# Patient Record
Sex: Female | Born: 1940 | Race: White | Hispanic: No | Marital: Married | Smoking: Never smoker
Health system: Southern US, Community
[De-identification: ages and names within clinical notes are randomized; demographics above are authoritative.]

## PROBLEM LIST (undated history)

## (undated) DIAGNOSIS — K635 Polyp of colon: Secondary | ICD-10-CM

## (undated) DIAGNOSIS — E0590 Thyrotoxicosis, unspecified without thyrotoxic crisis or storm: Secondary | ICD-10-CM

## (undated) HISTORY — DX: Thyrotoxicosis, unspecified without thyrotoxic crisis or storm: E05.90

## (undated) HISTORY — PX: APPENDECTOMY: SHX54

## (undated) HISTORY — DX: Polyp of colon: K63.5

## (undated) HISTORY — PX: TONSILECTOMY, ADENOIDECTOMY, BILATERAL MYRINGOTOMY AND TUBES: SHX2538

---

## 2012-02-29 ENCOUNTER — Other Ambulatory Visit: Payer: Self-pay | Admitting: Unknown Physician Specialty

## 2012-02-29 LAB — APTT: Activated PTT: 27.4 secs (ref 23.6–35.9)

## 2012-03-02 ENCOUNTER — Ambulatory Visit: Payer: Self-pay | Admitting: Unknown Physician Specialty

## 2012-03-02 LAB — PLATELET COUNT: Platelet: 212 10*3/uL (ref 150–440)

## 2012-06-21 ENCOUNTER — Ambulatory Visit: Payer: Self-pay | Admitting: Unknown Physician Specialty

## 2013-02-14 ENCOUNTER — Ambulatory Visit: Payer: Self-pay | Admitting: Unknown Physician Specialty

## 2013-11-20 DIAGNOSIS — L988 Other specified disorders of the skin and subcutaneous tissue: Secondary | ICD-10-CM | POA: Insufficient documentation

## 2013-11-20 DIAGNOSIS — L908 Other atrophic disorders of skin: Secondary | ICD-10-CM

## 2014-02-15 ENCOUNTER — Ambulatory Visit: Payer: Self-pay | Admitting: Unknown Physician Specialty

## 2014-08-02 DIAGNOSIS — E039 Hypothyroidism, unspecified: Secondary | ICD-10-CM | POA: Insufficient documentation

## 2014-11-06 DIAGNOSIS — Z719 Counseling, unspecified: Secondary | ICD-10-CM | POA: Insufficient documentation

## 2014-11-30 ENCOUNTER — Ambulatory Visit: Payer: Self-pay | Admitting: Gastroenterology

## 2014-11-30 DIAGNOSIS — D126 Benign neoplasm of colon, unspecified: Secondary | ICD-10-CM | POA: Insufficient documentation

## 2015-02-04 LAB — SURGICAL PATHOLOGY

## 2015-02-18 ENCOUNTER — Other Ambulatory Visit: Payer: Self-pay | Admitting: Unknown Physician Specialty

## 2015-02-18 DIAGNOSIS — E041 Nontoxic single thyroid nodule: Secondary | ICD-10-CM

## 2015-02-20 ENCOUNTER — Ambulatory Visit
Admission: RE | Admit: 2015-02-20 | Discharge: 2015-02-20 | Disposition: A | Discharge status: Home / Self Care | Payer: PPO | Source: Ambulatory Visit | Attending: Unknown Physician Specialty | Admitting: Unknown Physician Specialty

## 2015-02-20 DIAGNOSIS — E041 Nontoxic single thyroid nodule: Secondary | ICD-10-CM | POA: Diagnosis not present

## 2015-08-08 ENCOUNTER — Other Ambulatory Visit: Payer: Self-pay | Admitting: Unknown Physician Specialty

## 2015-08-08 DIAGNOSIS — E041 Nontoxic single thyroid nodule: Secondary | ICD-10-CM

## 2015-08-13 DIAGNOSIS — K862 Cyst of pancreas: Secondary | ICD-10-CM | POA: Insufficient documentation

## 2015-10-31 DIAGNOSIS — Z803 Family history of malignant neoplasm of breast: Secondary | ICD-10-CM | POA: Diagnosis not present

## 2015-10-31 DIAGNOSIS — Z1231 Encounter for screening mammogram for malignant neoplasm of breast: Secondary | ICD-10-CM | POA: Diagnosis not present

## 2015-12-25 DIAGNOSIS — E039 Hypothyroidism, unspecified: Secondary | ICD-10-CM | POA: Diagnosis not present

## 2015-12-25 DIAGNOSIS — E538 Deficiency of other specified B group vitamins: Secondary | ICD-10-CM | POA: Diagnosis not present

## 2015-12-25 DIAGNOSIS — M8580 Other specified disorders of bone density and structure, unspecified site: Secondary | ICD-10-CM | POA: Diagnosis not present

## 2015-12-25 DIAGNOSIS — Z0000 Encounter for general adult medical examination without abnormal findings: Secondary | ICD-10-CM | POA: Diagnosis not present

## 2016-01-02 ENCOUNTER — Other Ambulatory Visit: Payer: Self-pay | Admitting: Family Medicine

## 2016-01-02 DIAGNOSIS — M8580 Other specified disorders of bone density and structure, unspecified site: Secondary | ICD-10-CM

## 2016-01-20 ENCOUNTER — Ambulatory Visit
Admission: RE | Admit: 2016-01-20 | Discharge: 2016-01-20 | Disposition: A | Discharge status: Home / Self Care | Payer: PPO | Source: Ambulatory Visit | Attending: Family Medicine | Admitting: Family Medicine

## 2016-01-20 ENCOUNTER — Encounter: Payer: Self-pay | Admitting: Emergency Medicine

## 2016-01-20 ENCOUNTER — Emergency Department: Payer: PPO

## 2016-01-20 ENCOUNTER — Emergency Department
Admission: EM | Admit: 2016-01-20 | Discharge: 2016-01-20 | Disposition: A | Discharge status: Home / Self Care | Payer: PPO | Attending: Emergency Medicine | Admitting: Emergency Medicine

## 2016-01-20 DIAGNOSIS — M25512 Pain in left shoulder: Secondary | ICD-10-CM | POA: Diagnosis not present

## 2016-01-20 DIAGNOSIS — S29019A Strain of muscle and tendon of unspecified wall of thorax, initial encounter: Secondary | ICD-10-CM

## 2016-01-20 DIAGNOSIS — S32010A Wedge compression fracture of first lumbar vertebra, initial encounter for closed fracture: Secondary | ICD-10-CM

## 2016-01-20 DIAGNOSIS — S161XXA Strain of muscle, fascia and tendon at neck level, initial encounter: Secondary | ICD-10-CM

## 2016-01-20 DIAGNOSIS — Y929 Unspecified place or not applicable: Secondary | ICD-10-CM | POA: Diagnosis not present

## 2016-01-20 DIAGNOSIS — Y939 Activity, unspecified: Secondary | ICD-10-CM | POA: Insufficient documentation

## 2016-01-20 DIAGNOSIS — Y999 Unspecified external cause status: Secondary | ICD-10-CM | POA: Insufficient documentation

## 2016-01-20 DIAGNOSIS — S29012A Strain of muscle and tendon of back wall of thorax, initial encounter: Secondary | ICD-10-CM | POA: Diagnosis not present

## 2016-01-20 DIAGNOSIS — S46812A Strain of other muscles, fascia and tendons at shoulder and upper arm level, left arm, initial encounter: Secondary | ICD-10-CM | POA: Diagnosis not present

## 2016-01-20 DIAGNOSIS — M546 Pain in thoracic spine: Secondary | ICD-10-CM | POA: Diagnosis not present

## 2016-01-20 DIAGNOSIS — M8589 Other specified disorders of bone density and structure, multiple sites: Secondary | ICD-10-CM | POA: Diagnosis not present

## 2016-01-20 DIAGNOSIS — S29011A Strain of muscle and tendon of front wall of thorax, initial encounter: Secondary | ICD-10-CM | POA: Insufficient documentation

## 2016-01-20 DIAGNOSIS — M542 Cervicalgia: Secondary | ICD-10-CM | POA: Diagnosis not present

## 2016-01-20 DIAGNOSIS — S46912A Strain of unspecified muscle, fascia and tendon at shoulder and upper arm level, left arm, initial encounter: Secondary | ICD-10-CM

## 2016-01-20 DIAGNOSIS — S4992XA Unspecified injury of left shoulder and upper arm, initial encounter: Secondary | ICD-10-CM | POA: Diagnosis not present

## 2016-01-20 DIAGNOSIS — K769 Liver disease, unspecified: Secondary | ICD-10-CM

## 2016-01-20 DIAGNOSIS — S199XXA Unspecified injury of neck, initial encounter: Secondary | ICD-10-CM | POA: Diagnosis not present

## 2016-01-20 DIAGNOSIS — S32018A Other fracture of first lumbar vertebra, initial encounter for closed fracture: Secondary | ICD-10-CM | POA: Diagnosis not present

## 2016-01-20 DIAGNOSIS — Z13820 Encounter for screening for osteoporosis: Secondary | ICD-10-CM | POA: Insufficient documentation

## 2016-01-20 DIAGNOSIS — M8580 Other specified disorders of bone density and structure, unspecified site: Secondary | ICD-10-CM

## 2016-01-20 DIAGNOSIS — S3992XA Unspecified injury of lower back, initial encounter: Secondary | ICD-10-CM | POA: Diagnosis not present

## 2016-01-20 DIAGNOSIS — M545 Low back pain: Secondary | ICD-10-CM | POA: Diagnosis not present

## 2016-01-20 MED ORDER — ACETAMINOPHEN 500 MG PO TABS
1000.0000 mg | ORAL_TABLET | Freq: Once | ORAL | Status: AC
  Administered 2016-01-20: 1000 mg via ORAL

## 2016-01-20 MED ORDER — ACETAMINOPHEN 500 MG PO TABS
ORAL_TABLET | ORAL | Status: AC
  Filled 2016-01-20: qty 2

## 2016-01-20 MED ORDER — TRAMADOL HCL 50 MG PO TABS: 50.0000 mg | ORAL_TABLET | Freq: Four times a day (QID) | ORAL | Status: DC | PRN
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
## 2016-01-20 NOTE — Discharge Instructions (Signed)
Tramadol as needed for pain. Be aware that this medication could cause drowsiness which may increase your risk for falling. Call your primary care doctor and make an appointment for further evaluation of the cyst on your liver as we discussed today.

## 2016-01-21 DIAGNOSIS — S32000A Wedge compression fracture of unspecified lumbar vertebra, initial encounter for closed fracture: Secondary | ICD-10-CM | POA: Diagnosis not present

## 2016-01-21 DIAGNOSIS — K769 Liver disease, unspecified: Secondary | ICD-10-CM | POA: Diagnosis not present

## 2016-01-21 DIAGNOSIS — M47816 Spondylosis without myelopathy or radiculopathy, lumbar region: Secondary | ICD-10-CM | POA: Diagnosis not present

## 2016-01-29 DIAGNOSIS — S32010A Wedge compression fracture of first lumbar vertebra, initial encounter for closed fracture: Secondary | ICD-10-CM | POA: Diagnosis not present

## 2016-01-30 ENCOUNTER — Ambulatory Visit
Admission: RE | Admit: 2016-01-30 | Discharge: 2016-01-30 | Disposition: A | Discharge status: Home / Self Care | Payer: PPO | Source: Ambulatory Visit | Attending: Unknown Physician Specialty | Admitting: Unknown Physician Specialty

## 2016-01-30 ENCOUNTER — Other Ambulatory Visit: Payer: Self-pay | Admitting: Unknown Physician Specialty

## 2016-01-30 DIAGNOSIS — S335XXA Sprain of ligaments of lumbar spine, initial encounter: Secondary | ICD-10-CM | POA: Insufficient documentation

## 2016-01-30 DIAGNOSIS — S32010A Wedge compression fracture of first lumbar vertebra, initial encounter for closed fracture: Secondary | ICD-10-CM

## 2016-01-30 DIAGNOSIS — M545 Low back pain: Secondary | ICD-10-CM | POA: Diagnosis not present

## 2016-01-30 DIAGNOSIS — M5126 Other intervertebral disc displacement, lumbar region: Secondary | ICD-10-CM | POA: Diagnosis not present

## 2016-01-30 DIAGNOSIS — S3992XA Unspecified injury of lower back, initial encounter: Secondary | ICD-10-CM | POA: Diagnosis not present

## 2016-02-07 DIAGNOSIS — K8689 Other specified diseases of pancreas: Secondary | ICD-10-CM | POA: Diagnosis not present

## 2016-02-07 DIAGNOSIS — K769 Liver disease, unspecified: Secondary | ICD-10-CM | POA: Diagnosis not present

## 2016-02-07 DIAGNOSIS — K7689 Other specified diseases of liver: Secondary | ICD-10-CM | POA: Diagnosis not present

## 2016-02-11 ENCOUNTER — Ambulatory Visit
Admission: RE | Admit: 2016-02-11 | Discharge: 2016-02-11 | Disposition: A | Discharge status: Home / Self Care | Payer: PPO | Source: Ambulatory Visit | Attending: Family Medicine | Admitting: Family Medicine

## 2016-02-11 ENCOUNTER — Other Ambulatory Visit: Payer: Self-pay | Admitting: Family Medicine

## 2016-02-11 DIAGNOSIS — M25511 Pain in right shoulder: Secondary | ICD-10-CM

## 2016-02-11 DIAGNOSIS — K862 Cyst of pancreas: Secondary | ICD-10-CM | POA: Diagnosis not present

## 2016-02-11 DIAGNOSIS — M8580 Other specified disorders of bone density and structure, unspecified site: Secondary | ICD-10-CM | POA: Insufficient documentation

## 2016-02-11 DIAGNOSIS — R202 Paresthesia of skin: Secondary | ICD-10-CM | POA: Diagnosis not present

## 2016-02-11 DIAGNOSIS — S4991XA Unspecified injury of right shoulder and upper arm, initial encounter: Secondary | ICD-10-CM | POA: Diagnosis not present

## 2016-02-21 ENCOUNTER — Ambulatory Visit
Admission: RE | Admit: 2016-02-21 | Discharge: 2016-02-21 | Disposition: A | Discharge status: Home / Self Care | Payer: PPO | Source: Ambulatory Visit | Attending: Family Medicine | Admitting: Family Medicine

## 2016-02-21 ENCOUNTER — Other Ambulatory Visit: Payer: Self-pay | Admitting: Family Medicine

## 2016-02-21 DIAGNOSIS — R911 Solitary pulmonary nodule: Secondary | ICD-10-CM

## 2016-03-02 ENCOUNTER — Ambulatory Visit: Payer: PPO

## 2016-03-02 DIAGNOSIS — E041 Nontoxic single thyroid nodule: Secondary | ICD-10-CM | POA: Diagnosis not present

## 2016-03-02 DIAGNOSIS — L818 Other specified disorders of pigmentation: Secondary | ICD-10-CM | POA: Diagnosis not present

## 2016-03-03 DIAGNOSIS — M5030 Other cervical disc degeneration, unspecified cervical region: Secondary | ICD-10-CM | POA: Diagnosis not present

## 2016-03-03 DIAGNOSIS — M546 Pain in thoracic spine: Secondary | ICD-10-CM | POA: Diagnosis not present

## 2016-03-03 DIAGNOSIS — M4722 Other spondylosis with radiculopathy, cervical region: Secondary | ICD-10-CM | POA: Diagnosis not present

## 2016-03-03 DIAGNOSIS — M549 Dorsalgia, unspecified: Secondary | ICD-10-CM | POA: Diagnosis not present

## 2016-03-03 DIAGNOSIS — M5412 Radiculopathy, cervical region: Secondary | ICD-10-CM | POA: Diagnosis not present

## 2016-03-03 DIAGNOSIS — M4155 Other secondary scoliosis, thoracolumbar region: Secondary | ICD-10-CM | POA: Diagnosis not present

## 2016-03-04 DIAGNOSIS — Z13220 Encounter for screening for lipoid disorders: Secondary | ICD-10-CM | POA: Diagnosis not present

## 2016-03-04 DIAGNOSIS — Z1321 Encounter for screening for nutritional disorder: Secondary | ICD-10-CM | POA: Diagnosis not present

## 2016-03-04 DIAGNOSIS — M8580 Other specified disorders of bone density and structure, unspecified site: Secondary | ICD-10-CM | POA: Diagnosis not present

## 2016-03-04 DIAGNOSIS — D126 Benign neoplasm of colon, unspecified: Secondary | ICD-10-CM | POA: Diagnosis not present

## 2016-03-04 DIAGNOSIS — L309 Dermatitis, unspecified: Secondary | ICD-10-CM | POA: Diagnosis not present

## 2016-03-04 DIAGNOSIS — Z131 Encounter for screening for diabetes mellitus: Secondary | ICD-10-CM | POA: Diagnosis not present

## 2016-03-04 DIAGNOSIS — Z780 Asymptomatic menopausal state: Secondary | ICD-10-CM | POA: Diagnosis not present

## 2016-03-04 DIAGNOSIS — I517 Cardiomegaly: Secondary | ICD-10-CM | POA: Diagnosis not present

## 2016-03-04 DIAGNOSIS — E039 Hypothyroidism, unspecified: Secondary | ICD-10-CM | POA: Diagnosis not present

## 2016-03-04 DIAGNOSIS — E041 Nontoxic single thyroid nodule: Secondary | ICD-10-CM | POA: Diagnosis not present

## 2016-03-04 DIAGNOSIS — E559 Vitamin D deficiency, unspecified: Secondary | ICD-10-CM | POA: Diagnosis not present

## 2016-03-04 DIAGNOSIS — K869 Disease of pancreas, unspecified: Secondary | ICD-10-CM | POA: Diagnosis not present

## 2016-03-04 DIAGNOSIS — Z136 Encounter for screening for cardiovascular disorders: Secondary | ICD-10-CM | POA: Diagnosis not present

## 2016-03-04 DIAGNOSIS — R918 Other nonspecific abnormal finding of lung field: Secondary | ICD-10-CM | POA: Diagnosis not present

## 2016-03-04 DIAGNOSIS — N6019 Diffuse cystic mastopathy of unspecified breast: Secondary | ICD-10-CM | POA: Diagnosis not present

## 2016-03-12 DIAGNOSIS — S52501A Unspecified fracture of the lower end of right radius, initial encounter for closed fracture: Secondary | ICD-10-CM | POA: Diagnosis not present

## 2016-03-12 DIAGNOSIS — W010XXA Fall on same level from slipping, tripping and stumbling without subsequent striking against object, initial encounter: Secondary | ICD-10-CM | POA: Diagnosis not present

## 2016-03-12 DIAGNOSIS — Y92009 Unspecified place in unspecified non-institutional (private) residence as the place of occurrence of the external cause: Secondary | ICD-10-CM | POA: Diagnosis not present

## 2016-03-12 DIAGNOSIS — S52521A Torus fracture of lower end of right radius, initial encounter for closed fracture: Secondary | ICD-10-CM | POA: Diagnosis not present

## 2016-03-12 DIAGNOSIS — S6991XA Unspecified injury of right wrist, hand and finger(s), initial encounter: Secondary | ICD-10-CM | POA: Diagnosis not present

## 2016-03-18 ENCOUNTER — Ambulatory Visit: Payer: PPO

## 2016-03-18 DIAGNOSIS — S52591A Other fractures of lower end of right radius, initial encounter for closed fracture: Secondary | ICD-10-CM | POA: Diagnosis not present

## 2016-03-20 DIAGNOSIS — K862 Cyst of pancreas: Secondary | ICD-10-CM | POA: Diagnosis not present

## 2016-03-25 ENCOUNTER — Ambulatory Visit
Admission: RE | Admit: 2016-03-25 | Discharge: 2016-03-25 | Disposition: A | Discharge status: Home / Self Care | Payer: PPO | Source: Ambulatory Visit | Attending: Unknown Physician Specialty | Admitting: Unknown Physician Specialty

## 2016-03-25 DIAGNOSIS — E041 Nontoxic single thyroid nodule: Secondary | ICD-10-CM | POA: Diagnosis not present

## 2016-03-31 DIAGNOSIS — L814 Other melanin hyperpigmentation: Secondary | ICD-10-CM | POA: Diagnosis not present

## 2016-03-31 DIAGNOSIS — X32XXXA Exposure to sunlight, initial encounter: Secondary | ICD-10-CM | POA: Diagnosis not present

## 2016-03-31 DIAGNOSIS — L821 Other seborrheic keratosis: Secondary | ICD-10-CM | POA: Diagnosis not present

## 2016-04-07 DIAGNOSIS — M5030 Other cervical disc degeneration, unspecified cervical region: Secondary | ICD-10-CM | POA: Diagnosis not present

## 2016-04-07 DIAGNOSIS — M4722 Other spondylosis with radiculopathy, cervical region: Secondary | ICD-10-CM | POA: Diagnosis not present

## 2016-04-07 DIAGNOSIS — S52591D Other fractures of lower end of right radius, subsequent encounter for closed fracture with routine healing: Secondary | ICD-10-CM | POA: Diagnosis not present

## 2016-04-07 DIAGNOSIS — M5412 Radiculopathy, cervical region: Secondary | ICD-10-CM | POA: Diagnosis not present

## 2016-04-24 DIAGNOSIS — S52591D Other fractures of lower end of right radius, subsequent encounter for closed fracture with routine healing: Secondary | ICD-10-CM | POA: Diagnosis not present

## 2016-05-05 ENCOUNTER — Ambulatory Visit (INDEPENDENT_AMBULATORY_CARE_PROVIDER_SITE_OTHER): Payer: PPO | Admitting: Sports Medicine

## 2016-05-05 ENCOUNTER — Encounter: Payer: Self-pay | Admitting: Sports Medicine

## 2016-05-05 DIAGNOSIS — S90821A Blister (nonthermal), right foot, initial encounter: Secondary | ICD-10-CM | POA: Diagnosis not present

## 2016-05-05 DIAGNOSIS — L601 Onycholysis: Secondary | ICD-10-CM | POA: Diagnosis not present

## 2016-05-05 DIAGNOSIS — N6019 Diffuse cystic mastopathy of unspecified breast: Secondary | ICD-10-CM | POA: Insufficient documentation

## 2016-05-05 DIAGNOSIS — M79674 Pain in right toe(s): Secondary | ICD-10-CM

## 2016-05-05 DIAGNOSIS — M810 Age-related osteoporosis without current pathological fracture: Secondary | ICD-10-CM | POA: Insufficient documentation

## 2016-05-05 DIAGNOSIS — L089 Local infection of the skin and subcutaneous tissue, unspecified: Secondary | ICD-10-CM

## 2016-05-05 DIAGNOSIS — M8580 Other specified disorders of bone density and structure, unspecified site: Secondary | ICD-10-CM | POA: Insufficient documentation

## 2016-05-05 DIAGNOSIS — L600 Ingrowing nail: Secondary | ICD-10-CM | POA: Diagnosis not present

## 2016-05-05 MED ORDER — AMOXICILLIN-POT CLAVULANATE 875-125 MG PO TABS: 1.0000 | ORAL_TABLET | Freq: Two times a day (BID) | ORAL | Status: DC

## 2016-05-05 NOTE — Patient Instructions (Signed)

## 2016-05-05 NOTE — Progress Notes (Signed)
Subjective: Sherry Bernard is a 75 y.o. female patient presents to office today complaining of a painful incurvated, red, hot, swollen right 2nd toenail; not sure how it started thinks tight shoes or socks may have caused it. This has been present for >1 week. Patient has treated this by neosporin. Patient denies fever/chills/nausea/vomitting/any other related constitutional symptoms at this time.  Patient Active Problem List   Diagnosis Date Noted  . Fibrocystic breast disease 05/05/2016  . Osteopenia 05/05/2016  . Cyst of pancreas 08/13/2015  . 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 Encounter for consultation 11/06/2014  . Acquired hypothyroidism 08/02/2014  . Rhytides 11/20/2013  . Postmenopausal 10/13/1991    Current Outpatient Prescriptions on File Prior to Visit  Medication Sig Dispense Refill  . levothyroxine (SYNTHROID, LEVOTHROID) 100 MCG tablet Take 100 mcg by mouth daily before breakfast.    . traMADol (ULTRAM) 50 MG tablet Take 1 tablet (50 mg total) by mouth every 6 (six) hours as needed. 20 tablet 0   No current facility-administered medications on file prior to visit.     No Known Allergies  Objective:  There were no vitals filed for this visit.  General: Well developed, nourished, in no acute distress, alert and oriented x3   Dermatology: Skin is warm, dry and supple bilateral. Right 2nd toenail with lysis and redness with blistered skin. (+) Erythema. (+) Edema. (+) serosanguous  drainage present. The remaining nails appear unremarkable at this time. There are no open sores, lesions or other signs of infection present.  Vascular: Dorsalis Pedis artery and Posterior Tibial artery pedal pulses are 1/4 bilateral with immedate capillary fill time. Scant Pedal hair growth present. No lower extremity edema.   Neruologic: Grossly intact via light touch bilateral.  Musculoskeletal: Tenderness to palpation of the Right 2nd toe. Muscular strength within normal limits in  all groups bilateral. 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  Assesement and Plan: Problem List Items Addressed This Visit    None    Visit Diagnoses    Blister of foot with infection, right, initial encounter    -  Primary   Relevant Medications   amoxicillin-clavulanate (AUGMENTIN) 875-125 MG per tablet 1 tablet (Start on 05/05/2016 10:15 AM)   Ingrown nail       Toe pain, right       Onycholysis of toenail          -Discussed treatment alternatives and plan of care; Explained permanent/temporary nail avulsion and post procedure course to patient. - After a verbal consent, injected 3 ml of a 50:50 mixture of 2% plain  lidocaine and 0.5% plain marcaine in a normal right 2nd toe block fashion. Next, a  betadine prep was performed. Anesthesia was tested and found to be appropriate.  The right 2nd toenail was then incised from the hyponychium to the epinychium and was removed and cleared from the field and the area was then flushed with alcohol and dressed with antibiotic cream and a dry sterile dressing. -Patient was instructed to leave the dressing intact for today and begin soaking  in a weak solution of betadine and water tomorrow. Patient was instructed to  soak for 15 minutes each day and apply neosporin and a gauze or bandaid dressing each day. -Patient was instructed to monitor the toe for signs of infection and return to office if toe becomes red, hot or swollen. -Rx Augmentin to take as instructed -Advised ice, elevation, and tylenol or motrin if needed for pain.  -  Recommend good supportive shoes that do not irritate toe.  -Patient is to return in 1 week for follow up care or sooner if problems arise.  Landis Martins, DPM

## 2016-05-14 ENCOUNTER — Encounter: Payer: Self-pay | Admitting: Podiatry

## 2016-05-14 ENCOUNTER — Ambulatory Visit (INDEPENDENT_AMBULATORY_CARE_PROVIDER_SITE_OTHER): Payer: PPO | Admitting: Podiatry

## 2016-05-14 ENCOUNTER — Telehealth: Payer: Self-pay | Admitting: Sports Medicine

## 2016-05-14 DIAGNOSIS — L539 Erythematous condition, unspecified: Secondary | ICD-10-CM

## 2016-05-14 DIAGNOSIS — L600 Ingrowing nail: Secondary | ICD-10-CM | POA: Diagnosis not present

## 2016-05-14 NOTE — Telephone Encounter (Signed)
Pt called in about toenail. It is still red from last Tuesday wants to know what she needs to do

## 2016-05-18 NOTE — Progress Notes (Signed)
Subjective: Sherry Bernard is a 75 y.o.  female returns to office today for follow up evaluation after having right 2nd digit toenail avulsion performed last week with Dr. Cannon Kettle. She presents today as a walk-in patient. She states there is a small amount of redness on the nail bed and she would have this area checked. She denies any drainage or pus. No pain. She is continued on antibiotics. Patient has been soaking using epsom salts and applying topical antibiotic covered with bandaid daily. Patient denies fevers, chills, nausea, vomiting. Denies any calf pain, chest pain, SOB.   Objective:  Vitals: Reviewed  General: Well developed, nourished, in no acute distress, alert and oriented x3   Dermatology: Skin is warm, dry and supple bilateral. Right 2nd nail bed appears to be clean, dry, with mild granular tissue and surrounding scab. There is faint erythema on the proximal nail fold how there is no ascending saline solution discrete localized. There is no drainage or pus expressed. No ascending synovitis. No fluctuance or crepitus. There is no malodor.  Neurovascular status: Intact. No lower extremity swelling; No pain with calf compression bilateral.  Musculoskeletal: No tenderness to palpation of the right 2nd digit nail bed. Muscular strength within normal limits bilateral.   Assesement and Plan: S/p partial nail avulsion, with faint erythema along the proximal nail fold.   -Continue soaking in epsom salts twice a day followed by antibiotic ointment and a band-aid. Can leave uncovered at night. Continue this until completely healed.  -Finish course of antibiotics  -Follow-up with Dr. Cannon Kettle as scheduled -Monitor for any signs/symptoms of infection. Call the office immediately if any occur or go directly to the emergency room. Call with any questions/concerns.  Celesta Gentile, DPM

## 2016-05-19 ENCOUNTER — Ambulatory Visit (INDEPENDENT_AMBULATORY_CARE_PROVIDER_SITE_OTHER): Payer: PPO | Admitting: Sports Medicine

## 2016-05-19 ENCOUNTER — Encounter: Payer: Self-pay | Admitting: Sports Medicine

## 2016-05-19 DIAGNOSIS — M79674 Pain in right toe(s): Secondary | ICD-10-CM

## 2016-05-19 DIAGNOSIS — Z9889 Other specified postprocedural states: Secondary | ICD-10-CM

## 2016-05-19 DIAGNOSIS — M79673 Pain in unspecified foot: Secondary | ICD-10-CM

## 2016-05-19 NOTE — Progress Notes (Signed)
Subjective: Sherry Bernard is a 75 y.o. female patient returns to office today for follow up evaluation after having Right 2nd toenail temporary nail avulsion performed on 05-05-16. Patient has been soaking using betadine and applying topical antibiotic covered with bandaid daily. Patient deniesfever/chills/nausea/vomitting/any other related constitutional symptoms at this time.  Patient Active Problem List   Diagnosis Date Noted  . Fibrocystic breast disease 05/05/2016  . Osteopenia 05/05/2016  . Cyst of pancreas 08/13/2015  . 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 Encounter for consultation 11/06/2014  . Acquired hypothyroidism 08/02/2014  . Rhytides 11/20/2013  . Postmenopausal 10/13/1991    Current Outpatient Prescriptions on File Prior to Visit  Medication Sig Dispense Refill  . baclofen (LIORESAL) 10 MG tablet     . Biotin 5 MG CAPS Take by mouth.    . calcium elemental as carbonate (BARIATRIC TUMS ULTRA) 400 MG chewable tablet Chew by mouth.    . Cholecalciferol (VITAMIN D-1000 MAX ST) 1000 units tablet Take by mouth.    . Coenzyme Q10 ([employer] COENZYME Q-10) 100 MG capsule Take by mouth.    . DHA-EPA-VITAMIN E PO Take by mouth.    . diclofenac (VOLTAREN) 75 MG EC tablet     . Diclofenac Sodium CR 100 MG 24 hr tablet Take by mouth.    . Glucosamine HCl-MSM (SM GLUCOSAMINE HCL-MSM) 750-750 MG TABS Take by mouth.    . hydrocortisone 2.5 % cream Apply topically.    Marland Kitchen levothyroxine (SYNTHROID, LEVOTHROID) 100 MCG tablet Take 100 mcg by mouth daily before breakfast.    . levothyroxine (SYNTHROID, LEVOTHROID) 100 MCG tablet Take by mouth.    . levothyroxine (SYNTHROID, LEVOTHROID) 75 MCG tablet     . levothyroxine (SYNTHROID, LEVOTHROID) 75 MCG tablet Take by mouth.    . Omega-3 Fatty Acids (OMEGA-3 FISH OIL) 1200 MG CAPS Take by mouth.    . polyethylene glycol powder (GLYCOLAX/MIRALAX) powder As directed for colonic prep.    . traMADol (ULTRAM) 50 MG tablet Take 1 tablet (50 mg total)  by mouth every 6 (six) hours as needed. 20 tablet 0  . traMADol (ULTRAM) 50 MG tablet Take by mouth.    . vitamin C (ASCORBIC ACID) 500 MG tablet Take by mouth.     Current Facility-Administered Medications on File Prior to Visit  Medication Dose Route Frequency Provider Last Rate Last Dose  . amoxicillin-clavulanate (AUGMENTIN) 875-125 MG per tablet 1 tablet  1 tablet Oral Q12H Jazzelle Zhang, DPM        No Known Allergies  Objective:  General: Well developed, nourished, in no acute distress, alert and oriented x3   Dermatology: Skin is warm, dry and supple bilateral. Right 2nd toenail bed appears to be clean, dry, with mild granular tissue and surrounding eschar/scab. (-) Erythema. (+) Mild Edema. (-) serosanguous drainage present. The remaining nails appear unremarkable at this time. There are no other lesions or other signs of infection  present.  Neurovascular status: Intact. No lower extremity swelling; No pain with calf compression bilateral.  Musculoskeletal: Decreased tenderness to palpation of the right 2nd toenail fold(s). 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 Muscular strength within normal limits bilateral.   Assesement and Plan: Problem List Items Addressed This Visit    None    Visit Diagnoses    S/P nail surgery    -  Primary   Toe pain, right          -Examined patient  -Cleansed right 2nd toenail fold and gently  scrubbed with peroxide and q-tip/curetted away eschar at site and applied antibiotic cream covered with bandaid.  -Discussed plan of care with patient. -Patient to use coban compression to right 2nd toe for swelling -Patient to now begin soaking in a weak solution of Epsom salt and warm water. Patient was instructed to soak for 15-20 minutes each day until the toe appears normal and there is no drainage, redness, tenderness, or swelling at the procedure site, and apply neosporin and a gauze or bandaid dressing each day as needed. May  leave open to air at night. -Educated patient on long term care after nail surgery. -Patient was instructed to monitor the toe for reoccurrence and signs of infection; Patient advised to return to office or go to ER if toe becomes red, hot or swollen. -Recommend stretching exercises for toes  -Recommend toe spacer for hammertoe -Patient is to return as needed or sooner if problems arise.  Landis Martins, DPM

## 2016-06-30 DIAGNOSIS — G5621 Lesion of ulnar nerve, right upper limb: Secondary | ICD-10-CM | POA: Diagnosis not present

## 2016-06-30 DIAGNOSIS — M5412 Radiculopathy, cervical region: Secondary | ICD-10-CM | POA: Diagnosis not present

## 2016-06-30 DIAGNOSIS — M5030 Other cervical disc degeneration, unspecified cervical region: Secondary | ICD-10-CM | POA: Diagnosis not present

## 2016-06-30 DIAGNOSIS — M4722 Other spondylosis with radiculopathy, cervical region: Secondary | ICD-10-CM | POA: Diagnosis not present

## 2016-07-17 DIAGNOSIS — H16223 Keratoconjunctivitis sicca, not specified as Sjogren's, bilateral: Secondary | ICD-10-CM | POA: Diagnosis not present

## 2016-07-29 DIAGNOSIS — H16223 Keratoconjunctivitis sicca, not specified as Sjogren's, bilateral: Secondary | ICD-10-CM | POA: Diagnosis not present

## 2016-08-11 DIAGNOSIS — M5030 Other cervical disc degeneration, unspecified cervical region: Secondary | ICD-10-CM | POA: Diagnosis not present

## 2016-08-11 DIAGNOSIS — G5621 Lesion of ulnar nerve, right upper limb: Secondary | ICD-10-CM | POA: Diagnosis not present

## 2016-08-11 DIAGNOSIS — M4722 Other spondylosis with radiculopathy, cervical region: Secondary | ICD-10-CM | POA: Diagnosis not present

## 2016-08-21 ENCOUNTER — Ambulatory Visit: Payer: Self-pay | Admitting: Internal Medicine

## 2016-09-09 DIAGNOSIS — H16223 Keratoconjunctivitis sicca, not specified as Sjogren's, bilateral: Secondary | ICD-10-CM | POA: Diagnosis not present

## 2016-09-14 ENCOUNTER — Ambulatory Visit: Payer: Self-pay | Admitting: Internal Medicine

## 2016-09-14 DIAGNOSIS — J060 Acute laryngopharyngitis: Secondary | ICD-10-CM | POA: Diagnosis not present

## 2016-09-30 ENCOUNTER — Encounter (INDEPENDENT_AMBULATORY_CARE_PROVIDER_SITE_OTHER): Payer: Self-pay

## 2016-09-30 ENCOUNTER — Encounter: Payer: Self-pay | Admitting: Internal Medicine

## 2016-09-30 ENCOUNTER — Ambulatory Visit (INDEPENDENT_AMBULATORY_CARE_PROVIDER_SITE_OTHER): Payer: PPO | Admitting: Internal Medicine

## 2016-09-30 DIAGNOSIS — K862 Cyst of pancreas: Secondary | ICD-10-CM

## 2016-09-30 DIAGNOSIS — D126 Benign neoplasm of colon, unspecified: Secondary | ICD-10-CM | POA: Diagnosis not present

## 2016-09-30 DIAGNOSIS — R05 Cough: Secondary | ICD-10-CM

## 2016-09-30 DIAGNOSIS — N6019 Diffuse cystic mastopathy of unspecified breast: Secondary | ICD-10-CM

## 2016-09-30 DIAGNOSIS — E039 Hypothyroidism, unspecified: Secondary | ICD-10-CM

## 2016-09-30 DIAGNOSIS — R059 Cough, unspecified: Secondary | ICD-10-CM

## 2016-09-30 DIAGNOSIS — R197 Diarrhea, unspecified: Secondary | ICD-10-CM

## 2016-09-30 NOTE — Progress Notes (Signed)
Pre visit review using our clinic review tool, if applicable. No additional management support is needed unless otherwise documented below in the visit note. 
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
## 2016-09-30 NOTE — Patient Instructions (Signed)
Saline nasal spray - flush nose at least 2-3x/day  nasacort nasal spray - 2 sprays each nostril one time per day.  Do this in the evening  Robitussin DM - twice a day as needed.     Continue your probiotic.    Can add benefiber.  May help to bulk up the stool.

## 2016-10-06 ENCOUNTER — Telehealth: Payer: Self-pay | Admitting: Internal Medicine

## 2016-10-06 DIAGNOSIS — R197 Diarrhea, unspecified: Secondary | ICD-10-CM | POA: Insufficient documentation

## 2016-10-06 DIAGNOSIS — R059 Cough, unspecified: Secondary | ICD-10-CM | POA: Insufficient documentation

## 2016-10-06 DIAGNOSIS — R05 Cough: Secondary | ICD-10-CM | POA: Insufficient documentation

## 2016-10-06 NOTE — Assessment & Plan Note (Signed)
With drainage and cough as outlined.  Lungs are clear.  Saline nasal spray and nasacort nasal spray as directed.  Robitussin DM bid prn.  Hold on abx.  Hold on xray.  Will try above.  No acid reflux.  Follow.  Will be reevaluated if symptoms persist.

## 2016-10-06 NOTE — Assessment & Plan Note (Signed)
Incidental cysts found on pancreas.  Evaluated.  Felt to have no worrisome features.  Too small for adequate cytology or FNA.  Recommended f/u MRI/MRCP in 12 months.

## 2016-10-06 NOTE — Telephone Encounter (Signed)
Patient advised of below and verbalized understanding.  She will go to urgent care to be evaluated.

## 2016-10-06 NOTE — Assessment & Plan Note (Signed)
Per note, had mammogram 2017.  Need report.

## 2016-10-06 NOTE — Telephone Encounter (Signed)
Pt called stating that her cough hasn't got any better and would like to see if something could be called in.Sherry Bernard

## 2016-10-06 NOTE — Assessment & Plan Note (Signed)
Intermittent.  Take probiotic daily.  Follow.  F/u if persistent.

## 2016-10-06 NOTE — Assessment & Plan Note (Signed)
Saw Dr Tami Ribas.  Thyroid nodules right side - stable by ultrasound 2013-2017.  On levothyroxine.  Follow tsh.

## 2016-10-06 NOTE — Telephone Encounter (Signed)
If persistent cough and no better with the nasal sprays and robitussin DM, etc, then she needs to be reevaluated.  May need xray.  Let her know that I am out of the office.

## 2016-10-06 NOTE — Telephone Encounter (Signed)
Patient calls stating she is just having a constant cough with mucus/phlegm no better.  She has tried OTC Robitussin DM twice a directed.  No fever please advise.

## 2016-10-06 NOTE — Assessment & Plan Note (Signed)
Per note, colonoscopy 2017 - as outlined.  Recommended f/u colonoscopy in 3 years.

## 2016-10-20 DIAGNOSIS — H16223 Keratoconjunctivitis sicca, not specified as Sjogren's, bilateral: Secondary | ICD-10-CM | POA: Diagnosis not present

## 2016-11-04 DIAGNOSIS — Z1231 Encounter for screening mammogram for malignant neoplasm of breast: Secondary | ICD-10-CM | POA: Diagnosis not present

## 2016-11-04 DIAGNOSIS — Z803 Family history of malignant neoplasm of breast: Secondary | ICD-10-CM | POA: Diagnosis not present

## 2016-11-04 LAB — HM MAMMOGRAPHY

## 2016-11-05 ENCOUNTER — Encounter: Payer: Self-pay | Admitting: Internal Medicine

## 2016-11-06 ENCOUNTER — Encounter: Payer: Self-pay | Admitting: Internal Medicine

## 2016-11-06 DIAGNOSIS — Z719 Counseling, unspecified: Secondary | ICD-10-CM | POA: Insufficient documentation

## 2017-01-06 ENCOUNTER — Ambulatory Visit (INDEPENDENT_AMBULATORY_CARE_PROVIDER_SITE_OTHER): Payer: PPO | Admitting: Internal Medicine

## 2017-01-06 ENCOUNTER — Encounter: Payer: Self-pay | Admitting: Internal Medicine

## 2017-01-06 VITALS — BP 118/76 | HR 70 | Temp 97.1°F | Resp 16 | Ht 63.0 in | Wt 122.8 lb

## 2017-01-06 DIAGNOSIS — R202 Paresthesia of skin: Secondary | ICD-10-CM | POA: Diagnosis not present

## 2017-01-06 DIAGNOSIS — M545 Low back pain, unspecified: Secondary | ICD-10-CM

## 2017-01-06 DIAGNOSIS — R197 Diarrhea, unspecified: Secondary | ICD-10-CM | POA: Diagnosis not present

## 2017-01-06 DIAGNOSIS — E039 Hypothyroidism, unspecified: Secondary | ICD-10-CM

## 2017-01-06 DIAGNOSIS — K862 Cyst of pancreas: Secondary | ICD-10-CM | POA: Diagnosis not present

## 2017-01-06 DIAGNOSIS — D126 Benign neoplasm of colon, unspecified: Secondary | ICD-10-CM

## 2017-01-06 DIAGNOSIS — R200 Anesthesia of skin: Secondary | ICD-10-CM

## 2017-01-06 MED ORDER — AZITHROMYCIN 250 MG PO TABS: ORAL_TABLET | ORAL | 0 refills | Status: DC

## 2017-01-06 MED ORDER — LEVOTHYROXINE SODIUM 75 MCG PO TABS: 75.0000 ug | ORAL_TABLET | Freq: Every day | ORAL | 12 refills | Status: DC

## 2017-01-06 NOTE — Progress Notes (Signed)
Patient ID: Sherry Bernard, female   DOB: 05-04-1941, 76 y.o.   MRN: [id number]   Subjective:    Patient ID: Sherry Bernard, female    DOB: 21-Jan-1941, 76 y.o.   MRN: [id number]  HPI  Patient here for a scheduled follow up.  She reports she has noticed some numbness and tingling in her right arm/elbow.  No neck pain.  Working on her posture.  Has taken diclofenac.  No weakness.  No headache.  Taking probiotic bid.  Diarrhea is better.  Stools more formed.  No abdominal pain.  No chest pain.  No sob.  Some low back discomfort.  Has been using ice and heat.  Seeing a trainer.  Is walking daily.  Walking on the inside of her foot.  Feels this is aggravating her legs/back.  Feels needs to work more on her posture and her gait.     Past Medical History:  Diagnosis Date  . Colon polyp   . Hyperthyroidism    Past Surgical History:  Procedure Laterality Date  . APPENDECTOMY     child  . TONSILECTOMY, ADENOIDECTOMY, BILATERAL MYRINGOTOMY AND TUBES     child   Family History  Problem Relation Age of Onset  . Breast cancer Mother    Social History   Social History  . Marital status: Married    Spouse name: N/A  . Number of children: 3  . Years of education: N/A   Occupational History  . retired    Social History Main Topics  . Smoking status: Never Smoker  . Smokeless tobacco: Never Used  . Alcohol use Yes     Comment: socially  . Drug use: No  . Sexual activity: Yes   Other Topics Concern  . None   Social History Narrative  . None    Outpatient Encounter Prescriptions as of 01/06/2017  Medication Sig  . Biotin 5 MG CAPS Take by mouth.  . Calcium Carb-Cholecalciferol (CALCIUM 1000 + D PO) Take by mouth.  . Calcium Carbonate-Simethicone 1000-60 MG CHEW Chew by mouth.  . Glucosamine HCl-MSM (SM GLUCOSAMINE HCL-MSM) 750-750 MG TABS Take by mouth.  . levothyroxine (SYNTHROID, LEVOTHROID) 75 MCG tablet Take 1 tablet (75 mcg total) by mouth daily before breakfast.  . Multiple  Vitamins-Minerals (CENTRUM SILVER 50+WOMEN) TABS Take 1 tablet by mouth daily.  . Omega-3 Fatty Acids (OMEGA-3 FISH OIL) 1200 MG CAPS Take by mouth.  . Probiotic Product (PROBIOTIC-10) CAPS Take by mouth.  Marland Kitchen XIIDRA 5 % SOLN Place 1 drop into both eyes as needed.  . [DISCONTINUED] levothyroxine (SYNTHROID, LEVOTHROID) 75 MCG tablet Take by mouth.  Marland Kitchen azithromycin (ZITHROMAX) 250 MG tablet Take 2 tablets x 1 day and then 1 tablet per day for four more days.   No facility-administered encounter medications on file as of 01/06/2017.     Review of Systems  Constitutional: Negative for appetite change and unexpected weight change.  HENT: Negative for congestion and sinus pressure.   Respiratory: Negative for cough, chest tightness and shortness of breath.   Cardiovascular: Negative for chest pain, palpitations and leg swelling.  Gastrointestinal: Positive for diarrhea. Negative for abdominal pain, nausea and vomiting.  Genitourinary: Negative for difficulty urinating and dysuria.  Musculoskeletal:       Low back pain as outlined.  Numbness/tingling - right arm and elbow.    Skin: Negative for color change and rash.  Neurological: Negative for dizziness, light-headedness and headaches.  Psychiatric/Behavioral: Negative for agitation and dysphoric mood.  Objective:    Physical Exam  Constitutional: She appears well-developed and well-nourished. No distress.  HENT:  Nose: Nose normal.  Mouth/Throat: Oropharynx is clear and moist.  Neck: Neck supple. No thyromegaly present.  Cardiovascular: Normal rate and regular rhythm.   Pulmonary/Chest: Breath sounds normal. No respiratory distress. She has no wheezes.  Abdominal: Soft. Bowel sounds are normal. There is no tenderness.  Musculoskeletal: She exhibits no edema or tenderness.  Lymphadenopathy:    She has no cervical adenopathy.  Skin: No rash noted. No erythema.  Psychiatric: She has a normal mood and affect. Her behavior is normal.      BP 118/76 (BP Location: Left Arm, Patient Position: Sitting, Cuff Size: Normal)   Pulse 70   Temp 97.1 F (36.2 C) (Oral)   Resp 16   Ht 5\' 3"  (1.6 m)   Wt 122 lb 12.8 oz (55.7 kg)   SpO2 98%   BMI 21.75 kg/m  Wt Readings from Last 3 Encounters:  01/06/17 122 lb 12.8 oz (55.7 kg)  09/30/16 120 lb 2 oz (54.5 kg)  01/20/16 117 lb (53.1 kg)     Lab Results  Component Value Date   PLT 212 03/02/2012   INR 0.8 02/29/2012    US Thyroid  Result Date: 03/25/2016 CLINICAL DATA:  76 year old female with a history of thyroid nodule EXAM: THYROID ULTRASOUND TECHNIQUE: Ultrasound examination of the thyroid gland and adjacent soft tissues was performed. COMPARISON:  Prior thyroid ultrasound 02/20/2015 FINDINGS: Right thyroid lobe Measurements: 3.3 x 1.1 x 1.1 cm (previously 3.6 x 0.7 x 0.9 cm). Diffusely heterogeneous thyroid gland. Solitary solid isoechoic nodule in the lower gland measures 0.9 x 0.6 x 0.8 cm which is slightly smaller than 1.1 x 0.7 x 1.0 cm previously. Left thyroid lobe Measurements: 2.7 x 1.5 x 1.1 cm (previously 3.3 x 0.7 x 0.7 cm). No nodules visualized. Isthmus Thickness: 0.3 cm.  No nodules visualized. Lymphadenopathy None visualized. IMPRESSION: 1. Involuting right lower lobe thyroid nodule. Decrease in size over time is consistent with benignity. 2. Otherwise, unchanged small heterogeneous thyroid gland. Electronically Signed   By: Jacqulynn Cadet M.D.   On: 03/25/2016 15:07       Assessment & Plan:   Problem List Items Addressed This Visit    Acquired hypothyroidism    On thyroid replacement.  Follow tsh.  Previous biopsy with normal path.  Ultrasound as outlined 03/25/16.  Follow thyroid function tests.        Relevant Medications   levothyroxine (SYNTHROID, LEVOTHROID) 75 MCG tablet   Other Relevant Orders   TSH   Lipid panel   Cyst of pancreas    Cyst found on pancreas (incidentally).  Evaluated.  Felt to have no worrisome features.  Too small for  adequate cytology or FNA.  Recommended f/u MRI/MRCP in 12 months.  Is scheduled for 02/07/16.        Diarrhea    On probiotic.  Diarrhea is better.  More formed stool.  Has had colonoscopy as outlined.  Discussed further w/up/evaluation.  She declines.  Wants to monitor.  Follow.        Relevant Orders   CBC with Differential/Platelet   Comprehensive metabolic panel   Low back pain - Primary    Discomfort as outlined.  Walks on the side of her foot. Feels needs to work on her posture.  Discussed further w/up and evaluation.  Refer to PT for gait training and core strengthening.  Relevant Orders   Ambulatory referral to Physical Therapy   Serrated adenoma of colon    Colonoscopy 2017.  Recommended f/u in 3 years.         Other Visit Diagnoses    Right arm numbness       Numbness and tingling as outlined.  discussed further w/up.  declines.  splint.  follow.    Relevant Orders   Vitamin B12       Einar Pheasant, MD

## 2017-01-06 NOTE — Progress Notes (Signed)
Pre-visit discussion using our clinic review tool. No additional management support is needed unless otherwise documented below in the visit note.  

## 2017-01-10 ENCOUNTER — Encounter: Payer: Self-pay | Admitting: Internal Medicine

## 2017-01-10 DIAGNOSIS — M545 Low back pain, unspecified: Secondary | ICD-10-CM | POA: Insufficient documentation

## 2017-01-10 NOTE — Assessment & Plan Note (Signed)
Discomfort as outlined.  Walks on the side of her foot. Feels needs to work on her posture.  Discussed further w/up and evaluation.  Refer to PT for gait training and core strengthening.

## 2017-01-10 NOTE — Assessment & Plan Note (Signed)
Cyst found on pancreas (incidentally).  Evaluated.  Felt to have no worrisome features.  Too small for adequate cytology or FNA.  Recommended f/u MRI/MRCP in 12 months.  Is scheduled for 02/07/16.

## 2017-01-10 NOTE — Assessment & Plan Note (Signed)
Colonoscopy 2017.  Recommended f/u in 3 years.   

## 2017-01-10 NOTE — Assessment & Plan Note (Signed)
On thyroid replacement.  Follow tsh.  Previous biopsy with normal path.  Ultrasound as outlined 03/25/16.  Follow thyroid function tests.

## 2017-01-10 NOTE — Assessment & Plan Note (Signed)
On probiotic.  Diarrhea is better.  More formed stool.  Has had colonoscopy as outlined.  Discussed further w/up/evaluation.  She declines.  Wants to monitor.  Follow.

## 2017-01-12 ENCOUNTER — Other Ambulatory Visit (INDEPENDENT_AMBULATORY_CARE_PROVIDER_SITE_OTHER): Payer: PPO

## 2017-01-12 DIAGNOSIS — R202 Paresthesia of skin: Secondary | ICD-10-CM | POA: Diagnosis not present

## 2017-01-12 DIAGNOSIS — E039 Hypothyroidism, unspecified: Secondary | ICD-10-CM

## 2017-01-12 DIAGNOSIS — R197 Diarrhea, unspecified: Secondary | ICD-10-CM | POA: Diagnosis not present

## 2017-01-12 DIAGNOSIS — R200 Anesthesia of skin: Secondary | ICD-10-CM

## 2017-01-12 LAB — COMPREHENSIVE METABOLIC PANEL
ALBUMIN: 4.1 g/dL (ref 3.5–5.2)
ALK PHOS: 57 U/L (ref 39–117)
ALT: 16 U/L (ref 0–35)
AST: 22 U/L (ref 0–37)
BUN: 20 mg/dL (ref 6–23)
CHLORIDE: 105 meq/L (ref 96–112)
CO2: 31 mEq/L (ref 19–32)
Calcium: 9.1 mg/dL (ref 8.4–10.5)
Creatinine, Ser: 0.83 mg/dL (ref 0.40–1.20)
GFR: 71.19 mL/min (ref >60.00)
GLUCOSE: 94 mg/dL (ref 70–99)
POTASSIUM: 4 meq/L (ref 3.5–5.1)
SODIUM: 142 meq/L (ref 135–145)
Total Bilirubin: 0.5 mg/dL (ref 0.2–1.2)
Total Protein: 6.5 g/dL (ref 6.0–8.3)

## 2017-01-12 LAB — CBC WITH DIFFERENTIAL/PLATELET
BASOS PCT: 0.8 % (ref 0.0–3.0)
Basophils Absolute: 0 10*3/uL (ref 0.0–0.1)
EOS PCT: 1.4 % (ref 0.0–5.0)
Eosinophils Absolute: 0.1 10*3/uL (ref 0.0–0.7)
HCT: 41.8 % (ref 36.0–46.0)
Hemoglobin: 14.3 g/dL (ref 12.0–15.0)
LYMPHS ABS: 1.6 10*3/uL (ref 0.7–4.0)
Lymphocytes Relative: 36.9 % (ref 12.0–46.0)
MCHC: 34.2 g/dL (ref 30.0–36.0)
MCV: 92.1 fl (ref 78.0–100.0)
MONO ABS: 0.4 10*3/uL (ref 0.1–1.0)
MONOS PCT: 8.9 % (ref 3.0–12.0)
NEUTROS PCT: 52 % (ref 43.0–77.0)
Neutro Abs: 2.3 10*3/uL (ref 1.4–7.7)
Platelets: 234 10*3/uL (ref 150.0–400.0)
RBC: 4.54 Mil/uL (ref 3.87–5.11)
RDW: 13.3 % (ref 11.5–15.5)
WBC: 4.4 10*3/uL (ref 4.0–10.5)

## 2017-01-12 LAB — LIPID PANEL
CHOL/HDL RATIO: 3
Cholesterol: 218 mg/dL — ABNORMAL HIGH (ref 0–200)
HDL: 86.2 mg/dL (ref >39.00)
LDL Cholesterol: 118 mg/dL — ABNORMAL HIGH (ref 0–99)
NONHDL: 131.6
Triglycerides: 67 mg/dL (ref 0.0–149.0)
VLDL: 13.4 mg/dL (ref 0.0–40.0)

## 2017-01-12 LAB — VITAMIN B12: Vitamin B-12: 1072 pg/mL — ABNORMAL HIGH (ref 211–911)

## 2017-01-12 LAB — TSH: TSH: 4.09 u[IU]/mL (ref 0.35–4.50)

## 2017-01-13 ENCOUNTER — Telehealth: Payer: Self-pay

## 2017-01-13 NOTE — Telephone Encounter (Signed)
Pt called back returning your call. Thank you!  Call pt @ [phone]

## 2017-01-13 NOTE — Telephone Encounter (Signed)
Left message to return call to our office.  

## 2017-01-13 NOTE — Telephone Encounter (Signed)
Called patient gave results diet information mailed

## 2017-01-13 NOTE — Telephone Encounter (Signed)
-----   Message from Einar Pheasant, MD sent at 01/13/2017  4:54 AM EDT ----- Notify pt that her cholesterol is slightly increased, but appears overall stable when compared to previous outside checks.  Low cholesterol diet and exercise.  We will follow.  Is not B12 deficient.  Thyroid test, hemoglobin, kidney function tests and liver function tests wnl.

## 2017-01-27 ENCOUNTER — Ambulatory Visit: Payer: PPO

## 2017-01-28 DIAGNOSIS — K862 Cyst of pancreas: Secondary | ICD-10-CM | POA: Diagnosis not present

## 2017-02-02 ENCOUNTER — Ambulatory Visit: Payer: PPO

## 2017-02-04 ENCOUNTER — Ambulatory Visit: Payer: PPO

## 2017-02-08 ENCOUNTER — Ambulatory Visit: Payer: PPO

## 2017-02-08 ENCOUNTER — Telehealth: Payer: Self-pay | Admitting: Internal Medicine

## 2017-02-08 NOTE — Telephone Encounter (Signed)
Called patient to verify she wants a z pack to take incase she gets sick. She also wanted me to check and see how many times a day she should be taking the probiotic.

## 2017-02-08 NOTE — Telephone Encounter (Signed)
Pt came into office requesting to have a prescription or  something to take in case she gets sick  [place]. She states that she spoke to Dr. Nicki Reaper about this before. Please advise pt, phone number is [phone].

## 2017-02-08 NOTE — Telephone Encounter (Signed)
Please advise 

## 2017-02-08 NOTE — Telephone Encounter (Signed)
If she is talking about motion sickness, I can prescribe scopolamine patches to apply behind her ear.  She would change every three days.  This helps with motion sickness, etc.  If wants, I can send in rx for this medication.

## 2017-02-09 NOTE — Telephone Encounter (Signed)
Patient will check pharmacy and let us know. Went over how to take otc meds as well she will call if any problems.

## 2017-02-09 NOTE — Telephone Encounter (Signed)
Has she checked with her pharmacy.  In reviewing the chart, it looks like this was sent in when she was here in the office on 01/06/17.  If not there, let me know.  Also, the probiotic is one time daily.

## 2017-03-05 ENCOUNTER — Telehealth: Payer: Self-pay | Admitting: *Deleted

## 2017-03-05 NOTE — Telephone Encounter (Signed)
Patient is having hair loss, and brittle nails. No other Symptoms. Patient suggested that she should have her thyroid checked or check for a vitamin deficiency. Please advise  Pt contact [phone]

## 2017-03-09 NOTE — Telephone Encounter (Signed)
Please let her know that her B12 was not low and her tsh was wnl in 01/2017.  If persistent problems with hair loss, I can refer her to dermatology.

## 2017-03-09 NOTE — Telephone Encounter (Signed)
Patient states started having problems about a month ago and noticed very bad while on trip. Had TSH and B12 checked in April

## 2017-03-09 NOTE — Telephone Encounter (Signed)
Please call pt at [phone]

## 2017-03-09 NOTE — Telephone Encounter (Signed)
Left message to return call to our office.  

## 2017-03-10 NOTE — Telephone Encounter (Signed)
Left message to return call to our office.  

## 2017-03-10 NOTE — Telephone Encounter (Signed)
Patient states she has app with you in July and she would just like to hold off until then to talk to you about it.

## 2017-03-17 DIAGNOSIS — H16223 Keratoconjunctivitis sicca, not specified as Sjogren's, bilateral: Secondary | ICD-10-CM | POA: Diagnosis not present

## 2017-03-25 DIAGNOSIS — L818 Other specified disorders of pigmentation: Secondary | ICD-10-CM | POA: Diagnosis not present

## 2017-03-25 DIAGNOSIS — E041 Nontoxic single thyroid nodule: Secondary | ICD-10-CM | POA: Diagnosis not present

## 2017-03-31 DIAGNOSIS — L821 Other seborrheic keratosis: Secondary | ICD-10-CM | POA: Diagnosis not present

## 2017-03-31 DIAGNOSIS — B078 Other viral warts: Secondary | ICD-10-CM | POA: Diagnosis not present

## 2017-03-31 DIAGNOSIS — L538 Other specified erythematous conditions: Secondary | ICD-10-CM | POA: Diagnosis not present

## 2017-03-31 DIAGNOSIS — D2272 Melanocytic nevi of left lower limb, including hip: Secondary | ICD-10-CM | POA: Diagnosis not present

## 2017-03-31 DIAGNOSIS — D225 Melanocytic nevi of trunk: Secondary | ICD-10-CM | POA: Diagnosis not present

## 2017-03-31 DIAGNOSIS — D2262 Melanocytic nevi of left upper limb, including shoulder: Secondary | ICD-10-CM | POA: Diagnosis not present

## 2017-04-17 ENCOUNTER — Telehealth: Payer: Self-pay | Admitting: Internal Medicine

## 2017-04-17 NOTE — Telephone Encounter (Signed)
Left pt message asking to call Allison back directly at [phone] to schedule AWV. Thanks! °

## 2017-04-30 ENCOUNTER — Encounter: Payer: Self-pay | Admitting: Internal Medicine

## 2017-04-30 DIAGNOSIS — H11823 Conjunctivochalasis, bilateral: Secondary | ICD-10-CM | POA: Diagnosis not present

## 2017-05-03 ENCOUNTER — Ambulatory Visit (INDEPENDENT_AMBULATORY_CARE_PROVIDER_SITE_OTHER): Payer: PPO

## 2017-05-03 VITALS — BP 116/72 | HR 63 | Temp 97.9°F | Resp 14 | Ht 63.0 in | Wt 123.4 lb

## 2017-05-03 DIAGNOSIS — Z23 Encounter for immunization: Secondary | ICD-10-CM

## 2017-05-03 DIAGNOSIS — Z9189 Other specified personal risk factors, not elsewhere classified: Secondary | ICD-10-CM | POA: Diagnosis not present

## 2017-05-03 DIAGNOSIS — Z0000 Encounter for general adult medical examination without abnormal findings: Secondary | ICD-10-CM

## 2017-05-03 NOTE — Progress Notes (Signed)
Subjective:   SYBRINA LANING is a 76 y.o. female who presents for an Initial Medicare Annual Wellness Visit.  Review of Systems    No ROS.  Medicare Wellness Visit. Additional risk factors are reflected in the social history.  Cardiac Risk Factors include: advanced age (>45men, >77 women)     Objective:    Today's Vitals   05/03/17 0812  BP: 116/72  Pulse: 63  Resp: 14  Temp: 97.9 F (36.6 C)  TempSrc: Oral  SpO2: 99%  Weight: 123 lb 6.4 oz (56 kg)  Height: 5\' 3"  (1.6 m)   Body mass index is 21.86 kg/m.   Current Medications (verified) Outpatient Encounter Prescriptions as of 05/03/2017  Medication Sig  . Calcium Carb-Cholecalciferol (CALCIUM 1000 + D PO) Take by mouth.  . Calcium Carbonate-Simethicone 1000-60 MG CHEW Chew by mouth.  . Glucosamine HCl-MSM (SM GLUCOSAMINE HCL-MSM) 750-750 MG TABS Take by mouth.  . levothyroxine (SYNTHROID, LEVOTHROID) 75 MCG tablet Take 1 tablet (75 mcg total) by mouth daily before breakfast. (Patient taking differently: Take 100 mcg by mouth daily before breakfast. )  . Multiple Vitamins-Minerals (CENTRUM SILVER 50+WOMEN) TABS Take 1 tablet by mouth daily.  . Omega-3 Fatty Acids (OMEGA-3 FISH OIL) 1200 MG CAPS Take by mouth.  . Probiotic Product (PROBIOTIC-10) CAPS Take by mouth.  Marland Kitchen XIIDRA 5 % SOLN Place 1 drop into both eyes as needed.  . Biotin 5 MG CAPS Take by mouth.  . [DISCONTINUED] azithromycin (ZITHROMAX) 250 MG tablet Take 2 tablets x 1 day and then 1 tablet per day for four more days.   No facility-administered encounter medications on file as of 05/03/2017.     Allergies (verified) Patient has no known allergies.   History: Past Medical History:  Diagnosis Date  . Colon polyp   . Hyperthyroidism    Past Surgical History:  Procedure Laterality Date  . APPENDECTOMY     child  . TONSILECTOMY, ADENOIDECTOMY, BILATERAL MYRINGOTOMY AND TUBES     child   Family History  Problem Relation Age of Onset  . Breast cancer  Mother    Social History   Occupational History  . retired    Social History Main Topics  . Smoking status: Never Smoker  . Smokeless tobacco: Never Used  . Alcohol use Yes     Comment: socially  . Drug use: No  . Sexual activity: Yes    Tobacco Counseling Counseling given: Not Answered   Activities of Daily Living In your present state of health, do you have any difficulty performing the following activities: 05/03/2017  Hearing? N  Vision? N  Difficulty concentrating or making decisions? Y  Walking or climbing stairs? N  Dressing or bathing? N  Doing errands, shopping? N  Preparing Food and eating ? N  Using the Toilet? N  In the past six months, have you accidently leaked urine? N  Do you have problems with loss of bowel control? N  Managing your Medications? N  Managing your Finances? N  Housekeeping or managing your Housekeeping? N  Some recent data might be hidden    Immunizations and Health Maintenance Immunization History  Administered Date(s) Administered  . Influenza-Unspecified 07/22/2015, 07/11/2016  . Pneumococcal Conjugate-13 07/22/2015  . Pneumococcal Polysaccharide-23 05/03/2017  . Tdap 08/10/2014   There are no preventive care reminders to display for this patient.  Patient Care Team: Einar Pheasant, MD as PCP - General (Internal Medicine)  Indicate any recent Medical Services you may have received from  other than [facility] providers in the past year (date may be approximate).     Assessment:   This is a routine wellness examination for [place]. The goal of the wellness visit is to assist the patient how to close the gaps in care and create a preventative care plan for the patient.   The roster of all physicians providing medical care to patient is listed in the Snapshot section of the chart.  Taking calcium VIT D as appropriate/Osteoporosis risk reviewed.    Safety issues reviewed; Smoke and carbon monoxide detectors in the home. No firearms  in the home.  Wears seatbelts when driving or riding with others. Patient does wear sunscreen or protective clothing when in direct sunlight. No violence in the home.  Patient is alert, normal appearance, oriented to person/place/and time.  Correctly identified the president of the [place], recall of 3/3 words, and performing simple calculations. Displays appropriate judgement and can read correct time from watch face.   No new identified risk were noted.  No failures at ADL's or IADL's.    BMI- discussed the importance of a healthy diet, water intake and the benefits of aerobic exercise. Educational material provided.   24 hour diet recall: Breakfast: Protein bar, 1 slice lemon poppy loaf Lunch: Tomato sandwich Dinner: Chief Strategy Officer   Daily fluid intake: 1-2 cups of caffeine, 2-3 cups of water.  Dental- every 6 months.  Sleep patterns- Sleeps 5-6 hours at night.  Wakes feeling rested.  Pneumocca administered L deltoid, tolerated well. Educational material provided.  Patient Concerns: None at this time. Follow up with PCP as needed.  Health maintenance gaps closed.  Hearing/Vision screen Hearing Screening Comments: Patient is able to hear conversational tones without difficulty.  No issues report.  Vision Screening Comments: Followed by [facility] (Dr. Jeni Salles) Wears corrective lenses when reading Last OV 04/2017 Visual acuity not assessed per patient preference since they have regular follow up with the ophthalmologist  Dietary issues and exercise activities discussed: Current Exercise Habits: Home exercise routine, Type of exercise: walking;strength training/weights (10.000 steps daily), Frequency (Times/Week): 7, Intensity: Mild  Goals    . Eat Less junk food    . Increase water intake      Depression Screen PHQ 2/9 Scores 05/03/2017 09/30/2016  PHQ - 2 Score 0 0  PHQ- 9 Score - 1    Fall Risk Fall Risk  05/03/2017 09/30/2016  Falls in the past year?  No No    Cognitive Function: MMSE - Mini Mental State Exam 05/03/2017  Orientation to time 5  Orientation to Place 5  Registration 3  Attention/ Calculation 5  Recall 3  Language- name 2 objects 2  Language- repeat 1  Language- follow 3 step command 3  Language- read & follow direction 1  Write a sentence 1  Copy design 1  Total score 30        Screening Tests Health Maintenance  Topic Date Due  . INFLUENZA VACCINE  05/12/2017  . TETANUS/TDAP  08/10/2024  . COLONOSCOPY  11/30/2024  . DEXA SCAN  Completed  . PNA vac Low Risk Adult  Completed      Plan:    End of life planning; Advance aging; Advanced directives discussed. Copy of current HCPOA/Living Will requested.    I have personally reviewed and noted the following in the patient's chart:   . Medical and social history . Use of alcohol, tobacco or illicit drugs  . Current medications and supplements . Functional ability  and status . Nutritional status . Physical activity . Advanced directives . List of other physicians . Hospitalizations, surgeries, and ER visits in previous 12 months . Vitals . Screenings to include cognitive, depression, and falls . Referrals and appointments  In addition, I have reviewed and discussed with patient certain preventive protocols, quality metrics, and best practice recommendations. A written personalized care plan for preventive services as well as general preventive health recommendations were provided to patient.     Varney Biles, LPN   [id number]/[id number]    Reviewed above information.  Agree with assessment and plan.  Dr Nicki Reaper

## 2017-05-03 NOTE — Patient Instructions (Addendum)
  Sherry Bernard , Thank you for taking time to come for your Medicare Wellness Visit. I appreciate your ongoing commitment to your health goals. Please review the following plan we discussed and let me know if I can assist you in the future.   Follow up with Dr. Nicki Reaper as needed.    Bring a copy of your [place] and/or Living Will to be scanned into chart.  Have a great day!  These are the goals we discussed: Goals    . Eat Less junk food    . Increase water intake       This is a list of the screening recommended for you and due dates:  Health Maintenance  Topic Date Due  . Flu Shot  05/12/2017  . Tetanus Vaccine  08/10/2024  . Colon Cancer Screening  11/30/2024  . DEXA scan (bone density measurement)  Completed  . Pneumonia vaccines  Completed

## 2017-05-04 ENCOUNTER — Encounter: Payer: Self-pay | Admitting: Internal Medicine

## 2017-05-04 ENCOUNTER — Ambulatory Visit (INDEPENDENT_AMBULATORY_CARE_PROVIDER_SITE_OTHER): Payer: PPO | Admitting: Internal Medicine

## 2017-05-04 ENCOUNTER — Ambulatory Visit (INDEPENDENT_AMBULATORY_CARE_PROVIDER_SITE_OTHER): Payer: PPO

## 2017-05-04 VITALS — BP 118/76 | HR 71 | Temp 98.6°F | Resp 12 | Ht 62.21 in | Wt 121.4 lb

## 2017-05-04 DIAGNOSIS — Z0000 Encounter for general adult medical examination without abnormal findings: Secondary | ICD-10-CM | POA: Diagnosis not present

## 2017-05-04 DIAGNOSIS — K862 Cyst of pancreas: Secondary | ICD-10-CM | POA: Diagnosis not present

## 2017-05-04 DIAGNOSIS — E039 Hypothyroidism, unspecified: Secondary | ICD-10-CM

## 2017-05-04 DIAGNOSIS — M545 Low back pain, unspecified: Secondary | ICD-10-CM

## 2017-05-04 DIAGNOSIS — R197 Diarrhea, unspecified: Secondary | ICD-10-CM | POA: Diagnosis not present

## 2017-05-04 DIAGNOSIS — M47816 Spondylosis without myelopathy or radiculopathy, lumbar region: Secondary | ICD-10-CM | POA: Diagnosis not present

## 2017-05-04 LAB — TSH: TSH: 0.1 u[IU]/mL — AB (ref 0.35–4.50)

## 2017-05-04 MED ORDER — NYSTATIN-TRIAMCINOLONE 100000-0.1 UNIT/GM-% EX CREA: 1.0000 "application " | TOPICAL_CREAM | Freq: Two times a day (BID) | CUTANEOUS | 0 refills | Status: DC

## 2017-05-04 NOTE — Progress Notes (Signed)
Patient ID: Sherry Bernard, female   DOB: [birth year], 76 y.o.   MRN: [id number]   Subjective:    Patient ID: Sherry Bernard, female    DOB: 1940/12/26, 76 y.o.   MRN: [id number]  HPI  Patient with past history of hyperthyroidism.  She is here for follow up of this as well as her physical.  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  Had MRI abdomen/MRCP - stable.  Recommended f/u in 18 months.  Eating.  She is having persistent diarrhea.  Is better with probiotics.   Also taking fiber.  Still has flares with her bowels/diarrhea at least one time per week.  Had colonoscopy 2017.  Discussed referral to GI given persistent symptoms.  No abdominal pain.  No chest pain.  Breathing stable.  She does reports some persistent low back pain.  No pain radiating down her leg.  Sees Dr Tami Ribas - thyroid.  Thyroid ultrasound - smaller nodules.  Has been released.     Past Medical History:  Diagnosis Date  . Colon polyp   . Hyperthyroidism    Past Surgical History:  Procedure Laterality Date  . APPENDECTOMY     child  . TONSILECTOMY, ADENOIDECTOMY, BILATERAL MYRINGOTOMY AND TUBES     child   Family History  Problem Relation Age of Onset  . Breast cancer Mother    Social History   Social History  . Marital status: Married    Spouse name: N/A  . Number of children: 3  . Years of education: N/A   Occupational History  . retired    Social History Main Topics  . Smoking status: Never Smoker  . Smokeless tobacco: Never Used  . Alcohol use Yes     Comment: socially  . Drug use: No  . Sexual activity: Yes   Other Topics Concern  . None   Social History Narrative  . None    Outpatient Encounter Prescriptions as of 05/04/2017  Medication Sig  . Biotin 5 MG CAPS Take by mouth.  . Calcium Carb-Cholecalciferol (CALCIUM 1000 + D PO) Take by mouth.  . Calcium Carbonate-Simethicone 1000-60 MG CHEW Chew by mouth.  . Glucosamine HCl-MSM (SM GLUCOSAMINE HCL-MSM) 750-750 MG TABS Take by mouth.    . Multiple Vitamins-Minerals (CENTRUM SILVER 50+WOMEN) TABS Take 1 tablet by mouth daily.  . Omega-3 Fatty Acids (OMEGA-3 FISH OIL) 1200 MG CAPS Take by mouth.  . Probiotic Product (PROBIOTIC-10) CAPS Take by mouth.  Marland Kitchen XIIDRA 5 % SOLN Place 1 drop into both eyes as needed.  . [DISCONTINUED] levothyroxine (SYNTHROID, LEVOTHROID) 75 MCG tablet Take 1 tablet (75 mcg total) by mouth daily before breakfast. (Patient taking differently: Take 100 mcg by mouth daily before breakfast. )  . levothyroxine (SYNTHROID, LEVOTHROID) 75 MCG tablet Take 1 tablet (75 mcg total) by mouth daily.  Marland Kitchen nystatin-triamcinolone (MYCOLOG II) cream Apply 1 application topically 2 (two) times daily.   No facility-administered encounter medications on file as of 05/04/2017.     Review of Systems  Constitutional: Negative for appetite change and unexpected weight change.  HENT: Negative for congestion and sinus pressure.   Respiratory: Negative for cough, chest tightness and shortness of breath.   Cardiovascular: Negative for chest pain, palpitations and leg swelling.  Gastrointestinal: Positive for diarrhea. Negative for abdominal pain, nausea and vomiting.  Genitourinary: Negative for difficulty urinating and dysuria.  Musculoskeletal: Positive for back pain. Negative for joint swelling.  Skin: Negative for color change and rash.  Neurological: Negative for  dizziness, light-headedness and headaches.  Psychiatric/Behavioral: Negative for agitation and dysphoric mood.       Objective:    Physical Exam  Constitutional: She is oriented to person, place, and time. She appears well-developed and well-nourished. No distress.  HENT:  Nose: Nose normal.  Mouth/Throat: Oropharynx is clear and moist.  Eyes: Right eye exhibits no discharge. Left eye exhibits no discharge. No scleral icterus.  Neck: Neck supple.  Cardiovascular: Normal rate and regular rhythm.   Pulmonary/Chest: Breath sounds normal. No accessory muscle  usage. No tachypnea. No respiratory distress. She has no decreased breath sounds. She has no wheezes. She has no rhonchi. Right breast exhibits no inverted nipple, no mass, no nipple discharge and no tenderness (no axillary adenopathy). Left breast exhibits no inverted nipple, no mass, no nipple discharge and no tenderness (no axilarry adenopathy).  Abdominal: Soft. Bowel sounds are normal. There is no tenderness.  Musculoskeletal: She exhibits no edema or tenderness.  Lymphadenopathy:    She has no cervical adenopathy.  Neurological: She is alert and oriented to person, place, and time.  Skin: Skin is warm. No rash noted. No erythema.  Psychiatric: She has a normal mood and affect. Her behavior is normal.    BP 118/76 (BP Location: Left Arm, Patient Position: Sitting, Cuff Size: Normal)   Pulse 71   Temp 98.6 F (37 C) (Oral)   Resp 12   Ht 5' 2.21" (1.58 m)   Wt 121 lb 6.4 oz (55.1 kg)   SpO2 99%   BMI 22.06 kg/m  Wt Readings from Last 3 Encounters:  05/04/17 121 lb 6.4 oz (55.1 kg)  05/03/17 123 lb 6.4 oz (56 kg)  01/06/17 122 lb 12.8 oz (55.7 kg)     Lab Results  Component Value Date   WBC 4.4 01/12/2017   HGB 14.3 01/12/2017   HCT 41.8 01/12/2017   PLT 234.0 01/12/2017   GLUCOSE 94 01/12/2017   CHOL 218 (H) 01/12/2017   TRIG 67.0 01/12/2017   HDL 86.20 01/12/2017   LDLCALC 118 (H) 01/12/2017   ALT 16 01/12/2017   AST 22 01/12/2017   NA 142 01/12/2017   K 4.0 01/12/2017   CL 105 01/12/2017   CREATININE 0.83 01/12/2017   BUN 20 01/12/2017   CO2 31 01/12/2017   TSH 0.10 (L) 05/04/2017   INR 0.8 02/29/2012    US Thyroid  Result Date: 03/25/2016 CLINICAL DATA:  76 year old female with a history of thyroid nodule EXAM: THYROID ULTRASOUND TECHNIQUE: Ultrasound examination of the thyroid gland and adjacent soft tissues was performed. COMPARISON:  Prior thyroid ultrasound 02/20/2015 FINDINGS: Right thyroid lobe Measurements: 3.3 x 1.1 x 1.1 cm (previously 3.6 x 0.7 x  0.9 cm). Diffusely heterogeneous thyroid gland. Solitary solid isoechoic nodule in the lower gland measures 0.9 x 0.6 x 0.8 cm which is slightly smaller than 1.1 x 0.7 x 1.0 cm previously. Left thyroid lobe Measurements: 2.7 x 1.5 x 1.1 cm (previously 3.3 x 0.7 x 0.7 cm). No nodules visualized. Isthmus Thickness: 0.3 cm.  No nodules visualized. Lymphadenopathy None visualized. IMPRESSION: 1. Involuting right lower lobe thyroid nodule. Decrease in size over time is consistent with benignity. 2. Otherwise, unchanged small heterogeneous thyroid gland. Electronically Signed   By: Jacqulynn Cadet M.D.   On: 03/25/2016 15:07       Assessment & Plan:   Problem List Items Addressed This Visit    Acquired hypothyroidism    On thyroid ultrasound.  Follow tsh.  Had synthroid changed recently -  by ENT.        Relevant Medications   levothyroxine (SYNTHROID, LEVOTHROID) 75 MCG tablet   Other Relevant Orders   TSH (Completed)   Cyst of pancreas    Cysto found on pancreas.  Had MRI/MRCP.  Stable.  Recommended f/u in 18 months.        Diarrhea    Persistent flares with diarrhea.  On probiotic.  Is better.  Still with flares at least one time per week.  Refer to GI for further evaluation. Colonoscopy as outlined.       Relevant Orders   Ambulatory referral to Gastroenterology   Healthcare maintenance    Physical today 05/04/17.  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  Recommended f/u in 3 years.  Mammogram 11/04/16 - Birads I.       Low back pain - Primary    Persistent low back pain.  Will check L-S spine xray.        Relevant Orders   DG Lumbar Spine 2-3 Views (Completed)       Einar Pheasant, MD

## 2017-05-04 NOTE — Progress Notes (Signed)
Pre-visit discussion using our clinic review tool. No additional management support is needed unless otherwise documented below in the visit note.  

## 2017-05-04 NOTE — Assessment & Plan Note (Signed)
Physical today 05/04/17.  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  Recommended f/u in 3 years.  Mammogram 11/04/16 - Birads I.

## 2017-05-05 ENCOUNTER — Other Ambulatory Visit: Payer: Self-pay | Admitting: Internal Medicine

## 2017-05-05 DIAGNOSIS — E039 Hypothyroidism, unspecified: Secondary | ICD-10-CM

## 2017-05-05 NOTE — Progress Notes (Signed)
Order placed for f/u tsh.  

## 2017-05-06 ENCOUNTER — Telehealth: Payer: Self-pay | Admitting: Internal Medicine

## 2017-05-06 ENCOUNTER — Encounter: Payer: Self-pay | Admitting: Internal Medicine

## 2017-05-06 MED ORDER — LEVOTHYROXINE SODIUM 75 MCG PO TABS: 75.0000 ug | ORAL_TABLET | Freq: Every day | ORAL | 0 refills | Status: DC

## 2017-05-06 NOTE — Assessment & Plan Note (Signed)
Cysto found on pancreas.  Had MRI/MRCP.  Stable.  Recommended f/u in 18 months.

## 2017-05-06 NOTE — Telephone Encounter (Signed)
Pt called returning your call. Please advise, thank you!  Call pt @ [phone]

## 2017-05-06 NOTE — Assessment & Plan Note (Signed)
Persistent flares with diarrhea.  On probiotic.  Is better.  Still with flares at least one time per week.  Refer to GI for further evaluation. Colonoscopy as outlined.

## 2017-05-06 NOTE — Assessment & Plan Note (Signed)
Persistent low back pain.  Will check L-S spine xray.

## 2017-05-06 NOTE — Telephone Encounter (Signed)
See other message in lab results

## 2017-05-06 NOTE — Assessment & Plan Note (Addendum)
On thyroid ultrasound.  Follow tsh.  Had synthroid changed recently - by ENT.

## 2017-05-21 NOTE — Telephone Encounter (Signed)
Seen [phone] 

## 2017-06-17 ENCOUNTER — Other Ambulatory Visit (INDEPENDENT_AMBULATORY_CARE_PROVIDER_SITE_OTHER): Payer: PPO

## 2017-06-17 DIAGNOSIS — E039 Hypothyroidism, unspecified: Secondary | ICD-10-CM | POA: Diagnosis not present

## 2017-06-17 LAB — TSH: TSH: 1.08 u[IU]/mL (ref 0.35–4.50)

## 2017-06-18 ENCOUNTER — Encounter: Payer: Self-pay | Admitting: Internal Medicine

## 2017-06-18 DIAGNOSIS — G5621 Lesion of ulnar nerve, right upper limb: Secondary | ICD-10-CM | POA: Diagnosis not present

## 2017-06-18 DIAGNOSIS — M5030 Other cervical disc degeneration, unspecified cervical region: Secondary | ICD-10-CM | POA: Diagnosis not present

## 2017-07-01 DIAGNOSIS — R197 Diarrhea, unspecified: Secondary | ICD-10-CM | POA: Diagnosis not present

## 2017-07-08 ENCOUNTER — Other Ambulatory Visit
Admission: RE | Admit: 2017-07-08 | Discharge: 2017-07-08 | Disposition: A | Discharge status: Home / Self Care | Payer: PPO | Source: Ambulatory Visit | Attending: Gastroenterology | Admitting: Gastroenterology

## 2017-07-08 DIAGNOSIS — R197 Diarrhea, unspecified: Secondary | ICD-10-CM | POA: Insufficient documentation

## 2017-07-08 LAB — GASTROINTESTINAL PANEL BY PCR, STOOL (REPLACES STOOL CULTURE)

## 2017-07-08 LAB — C DIFFICILE QUICK SCREEN W PCR REFLEX
C DIFFICILE (CDIFF) INTERP: NOT DETECTED
C Diff antigen: NEGATIVE
C Diff toxin: NEGATIVE

## 2017-07-09 ENCOUNTER — Telehealth: Payer: Self-pay

## 2017-07-09 DIAGNOSIS — N390 Urinary tract infection, site not specified: Secondary | ICD-10-CM | POA: Diagnosis not present

## 2017-07-09 DIAGNOSIS — R319 Hematuria, unspecified: Secondary | ICD-10-CM | POA: Diagnosis not present

## 2017-07-09 NOTE — Telephone Encounter (Signed)
Patient came in today complaining of possible blood in urine and urine is dark. Says that she had stool culture yesterday and felt like she may have gotten some in her vaginal area. Patient was advised to go to [facility] clinic for evaluation and she agreed.   Vital signs: BP- 122/70 T- 98.1 P- 83 R- 16 O2- 98%

## 2017-07-14 LAB — PANCREATIC ELASTASE, FECAL: Pancreatic Elastase-1, Stool: >500 ug Elast./g (ref >200)

## 2017-07-21 DIAGNOSIS — Z780 Asymptomatic menopausal state: Secondary | ICD-10-CM | POA: Diagnosis not present

## 2017-07-21 DIAGNOSIS — Z13220 Encounter for screening for lipoid disorders: Secondary | ICD-10-CM | POA: Diagnosis not present

## 2017-07-21 DIAGNOSIS — D126 Benign neoplasm of colon, unspecified: Secondary | ICD-10-CM | POA: Diagnosis not present

## 2017-07-21 DIAGNOSIS — M8580 Other specified disorders of bone density and structure, unspecified site: Secondary | ICD-10-CM | POA: Diagnosis not present

## 2017-07-21 DIAGNOSIS — E039 Hypothyroidism, unspecified: Secondary | ICD-10-CM | POA: Diagnosis not present

## 2017-07-21 DIAGNOSIS — R918 Other nonspecific abnormal finding of lung field: Secondary | ICD-10-CM | POA: Diagnosis not present

## 2017-07-21 DIAGNOSIS — Z136 Encounter for screening for cardiovascular disorders: Secondary | ICD-10-CM | POA: Diagnosis not present

## 2017-07-21 DIAGNOSIS — Z131 Encounter for screening for diabetes mellitus: Secondary | ICD-10-CM | POA: Diagnosis not present

## 2017-07-21 DIAGNOSIS — N6019 Diffuse cystic mastopathy of unspecified breast: Secondary | ICD-10-CM | POA: Diagnosis not present

## 2017-07-21 DIAGNOSIS — Z0000 Encounter for general adult medical examination without abnormal findings: Secondary | ICD-10-CM | POA: Diagnosis not present

## 2017-07-21 DIAGNOSIS — K862 Cyst of pancreas: Secondary | ICD-10-CM | POA: Diagnosis not present

## 2017-08-16 ENCOUNTER — Ambulatory Visit (INDEPENDENT_AMBULATORY_CARE_PROVIDER_SITE_OTHER): Payer: PPO | Admitting: Internal Medicine

## 2017-08-16 ENCOUNTER — Encounter: Payer: Self-pay | Admitting: Internal Medicine

## 2017-08-16 DIAGNOSIS — R197 Diarrhea, unspecified: Secondary | ICD-10-CM

## 2017-08-16 DIAGNOSIS — M8580 Other specified disorders of bone density and structure, unspecified site: Secondary | ICD-10-CM

## 2017-08-16 DIAGNOSIS — K862 Cyst of pancreas: Secondary | ICD-10-CM | POA: Diagnosis not present

## 2017-08-16 DIAGNOSIS — E039 Hypothyroidism, unspecified: Secondary | ICD-10-CM | POA: Diagnosis not present

## 2017-08-16 DIAGNOSIS — F419 Anxiety disorder, unspecified: Secondary | ICD-10-CM

## 2017-08-16 DIAGNOSIS — R319 Hematuria, unspecified: Secondary | ICD-10-CM

## 2017-08-16 LAB — URINALYSIS, ROUTINE W REFLEX MICROSCOPIC
Bilirubin Urine: NEGATIVE
Hgb urine dipstick: NEGATIVE
KETONES UR: NEGATIVE
NITRITE: NEGATIVE
RBC / HPF: NONE SEEN (ref 0–?)
SPECIFIC GRAVITY, URINE: 1.01 (ref 1.000–1.030)
TOTAL PROTEIN, URINE-UPE24: NEGATIVE
UROBILINOGEN UA: 0.2 (ref 0.0–1.0)
Urine Glucose: NEGATIVE
pH: 6 (ref 5.0–8.0)

## 2017-08-16 MED ORDER — LEVOTHYROXINE SODIUM 75 MCG PO TABS: 75.0000 ug | ORAL_TABLET | Freq: Every day | ORAL | 1 refills | Status: DC

## 2017-08-16 MED ORDER — MUPIROCIN 2 % EX OINT: TOPICAL_OINTMENT | CUTANEOUS | 0 refills | Status: DC

## 2017-08-16 NOTE — Progress Notes (Signed)
Patient ID: Sherry Bernard, female   DOB: Jun 03, 1941, 76 y.o.   MRN: [id number]   Subjective:    Patient ID: Sherry Bernard, female    DOB: 06/12/1941, 76 y.o.   MRN: [id number]  HPI  Patient here for a scheduled follow up.  Has a pancreatic cyst - MRI/MRCP - 01/2017.  Recommended f/u in 18 months from last check.  Followed by GI.  Her bowels are better.  Saw GI.  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  Instructed to take immodium.  States she has not had to take.  She is doing better with benefiber and two probiotics.  She is walking now.  She reports some occasional chest discomfort.  No chest pain with increased activity or exertion.  Vague symptoms.  Discussed further w/up.  She declines.  No sob.  States she feels may be related to some increased stress.  Discussed increased stress.  She desires no further intervention.  States she feels better when she works out.  No abdominal pain.  No acid reflux reported.  She was recently evaluated for hematuria.  States noticed blood in her urine.  Was seen at acute care.  Placed on abx.  No infection noted on culture.  No further bleeding.  No vaginal bleeding.  No rectal bleeding.     Past Medical History:  Diagnosis Date  . Colon polyp   . Hyperthyroidism    Past Surgical History:  Procedure Laterality Date  . APPENDECTOMY     child  . TONSILECTOMY, ADENOIDECTOMY, BILATERAL MYRINGOTOMY AND TUBES     child   Family History  Problem Relation Age of Onset  . Breast cancer Mother    Social History   Socioeconomic History  . Marital status: Married    Spouse name: None  . Number of children: 3  . Years of education: None  . Highest education level: None  Social Needs  . Financial resource strain: None  . Food insecurity - worry: None  . Food insecurity - inability: None  . Transportation needs - medical: None  . Transportation needs - non-medical: None  Occupational History  . Occupation: retired  Tobacco Use  . Smoking status: Never Smoker  . Smokeless  tobacco: Never Used  Substance and Sexual Activity  . Alcohol use: Yes    Comment: socially  . Drug use: No  . Sexual activity: Yes  Other Topics Concern  . None  Social History Narrative  . None    Outpatient Encounter Medications as of 08/16/2017  Medication Sig  . Biotin 5 MG CAPS Take by mouth.  . Calcium Carbonate-Simethicone 1000-60 MG CHEW Chew by mouth.  . Glucosamine HCl-MSM (SM GLUCOSAMINE HCL-MSM) 750-750 MG TABS Take by mouth.  . levothyroxine (SYNTHROID, LEVOTHROID) 75 MCG tablet Take 1 tablet (75 mcg total) daily by mouth.  . Multiple Vitamins-Minerals (CENTRUM SILVER 50+WOMEN) TABS Take 1 tablet by mouth daily.  . Omega-3 Fatty Acids (OMEGA-3 FISH OIL) 1200 MG CAPS Take by mouth.  . Probiotic Product (PROBIOTIC-10) CAPS Take by mouth.  Marland Kitchen XIIDRA 5 % SOLN Place 1 drop into both eyes as needed.  . [DISCONTINUED] levothyroxine (SYNTHROID, LEVOTHROID) 75 MCG tablet Take 1 tablet (75 mcg total) by mouth daily.  . mupirocin ointment (BACTROBAN) 2 % Apply to affected area bid  . [DISCONTINUED] Calcium Carb-Cholecalciferol (CALCIUM 1000 + D PO) Take by mouth.  . [DISCONTINUED] nystatin-triamcinolone (MYCOLOG II) cream Apply 1 application topically 2 (two) times daily.   No facility-administered encounter medications on  file as of 08/16/2017.     Review of Systems  Constitutional: Negative for appetite change and unexpected weight change.  HENT: Negative for congestion and sinus pressure.   Respiratory: Negative for cough, chest tightness and shortness of breath.   Cardiovascular: Positive for chest pain. Negative for palpitations and leg swelling.  Gastrointestinal: Negative for abdominal pain, diarrhea, nausea and vomiting.  Genitourinary: Positive for hematuria. Negative for difficulty urinating and dysuria.  Musculoskeletal: Negative for back pain and joint swelling.  Skin: Negative for color change and rash.  Neurological: Negative for dizziness, light-headedness and  headaches.  Psychiatric/Behavioral: Negative for agitation and dysphoric mood.       Increased stress as outlined.         Objective:    Physical Exam  Constitutional: She appears well-developed and well-nourished. No distress.  HENT:  Nose: Nose normal.  Mouth/Throat: Oropharynx is clear and moist.  Neck: Neck supple. No thyromegaly present.  Cardiovascular: Normal rate and regular rhythm.  Pulmonary/Chest: Breath sounds normal. No respiratory distress. She has no wheezes.  Abdominal: Soft. Bowel sounds are normal. There is no tenderness.  Musculoskeletal: She exhibits no edema or tenderness.  Lymphadenopathy:    She has no cervical adenopathy.  Skin: No rash noted. No erythema.  Psychiatric: She has a normal mood and affect. Her behavior is normal.    BP 118/74   Pulse 72   Temp 98.6 F (37 C) (Oral)   Resp 14   Ht 5\' 2"  (1.575 m)   Wt 123 lb 6.4 oz (56 kg)   SpO2 97%   BMI 22.57 kg/m  Wt Readings from Last 3 Encounters:  08/16/17 123 lb 6.4 oz (56 kg)  05/04/17 121 lb 6.4 oz (55.1 kg)  05/03/17 123 lb 6.4 oz (56 kg)     Lab Results  Component Value Date   WBC 4.4 01/12/2017   HGB 14.3 01/12/2017   HCT 41.8 01/12/2017   PLT 234.0 01/12/2017   GLUCOSE 94 01/12/2017   CHOL 218 (H) 01/12/2017   TRIG 67.0 01/12/2017   HDL 86.20 01/12/2017   LDLCALC 118 (H) 01/12/2017   ALT 16 01/12/2017   AST 22 01/12/2017   NA 142 01/12/2017   K 4.0 01/12/2017   CL 105 01/12/2017   CREATININE 0.83 01/12/2017   BUN 20 01/12/2017   CO2 31 01/12/2017   TSH 1.08 06/17/2017   INR 0.8 02/29/2012       Assessment & Plan:   Problem List Items Addressed This Visit    Acquired hypothyroidism    On thyroid replacement.  Follow tsh.        Relevant Medications   levothyroxine (SYNTHROID, LEVOTHROID) 75 MCG tablet   Anxiety    Discussed with her at length today.  She feels contributing to some of her symptoms.  Discussed medications.  Wants to hold on any further  intervention.  Follow.        Cyst of pancreas    Cyst found on pancreas.  Had MRI/MRCP (01/2017).  Recommended f/u in 18 months.        Diarrhea    Saw GI.  Bowels better.  On probiotics and benefiber.  Follow.        Hematuria    Previous hematuria.  Treated for infection, but no infection noted on culture.  Recheck urine to confirm if blood present.  Consider urology referral.        Relevant Orders   Urinalysis, Routine w reflex microscopic (Completed)  Osteopenia    Bone density as outlined.  Recommended f/u in 2020.            Einar Pheasant, MD

## 2017-08-18 ENCOUNTER — Encounter: Payer: Self-pay | Admitting: Internal Medicine

## 2017-08-18 DIAGNOSIS — F419 Anxiety disorder, unspecified: Secondary | ICD-10-CM | POA: Insufficient documentation

## 2017-08-18 NOTE — Assessment & Plan Note (Signed)
Previous hematuria.  Treated for infection, but no infection noted on culture.  Recheck urine to confirm if blood present.  Consider urology referral.

## 2017-08-18 NOTE — Assessment & Plan Note (Signed)
Saw GI.  Bowels better.  On probiotics and benefiber.  Follow.

## 2017-08-18 NOTE — Assessment & Plan Note (Signed)
Bone density as outlined.  Recommended f/u in 2020.   

## 2017-08-18 NOTE — Assessment & Plan Note (Signed)
Discussed with her at length today.  She feels contributing to some of her symptoms.  Discussed medications.  Wants to hold on any further intervention.  Follow.

## 2017-08-18 NOTE — Assessment & Plan Note (Signed)
Cyst found on pancreas.  Had MRI/MRCP (01/2017).  Recommended f/u in 18 months.

## 2017-08-18 NOTE — Assessment & Plan Note (Signed)
On thyroid replacement.  Follow tsh.  

## 2017-11-01 DIAGNOSIS — H2513 Age-related nuclear cataract, bilateral: Secondary | ICD-10-CM | POA: Diagnosis not present

## 2017-11-10 DIAGNOSIS — Z1231 Encounter for screening mammogram for malignant neoplasm of breast: Secondary | ICD-10-CM | POA: Diagnosis not present

## 2017-11-10 DIAGNOSIS — Z803 Family history of malignant neoplasm of breast: Secondary | ICD-10-CM | POA: Diagnosis not present

## 2017-11-10 LAB — HM MAMMOGRAPHY

## 2017-11-16 DIAGNOSIS — K580 Irritable bowel syndrome with diarrhea: Secondary | ICD-10-CM | POA: Diagnosis not present

## 2017-11-17 ENCOUNTER — Encounter: Payer: Self-pay | Admitting: Internal Medicine

## 2017-11-17 ENCOUNTER — Ambulatory Visit (INDEPENDENT_AMBULATORY_CARE_PROVIDER_SITE_OTHER): Payer: PPO | Admitting: Internal Medicine

## 2017-11-17 VITALS — BP 138/70 | HR 72 | Temp 97.3°F | Ht 63.0 in | Wt 124.2 lb

## 2017-11-17 DIAGNOSIS — E039 Hypothyroidism, unspecified: Secondary | ICD-10-CM

## 2017-11-17 DIAGNOSIS — R197 Diarrhea, unspecified: Secondary | ICD-10-CM | POA: Diagnosis not present

## 2017-11-17 DIAGNOSIS — M545 Low back pain, unspecified: Secondary | ICD-10-CM

## 2017-11-17 DIAGNOSIS — R319 Hematuria, unspecified: Secondary | ICD-10-CM

## 2017-11-17 DIAGNOSIS — K862 Cyst of pancreas: Secondary | ICD-10-CM

## 2017-11-17 DIAGNOSIS — F419 Anxiety disorder, unspecified: Secondary | ICD-10-CM | POA: Diagnosis not present

## 2017-11-17 LAB — URINALYSIS, ROUTINE W REFLEX MICROSCOPIC
BILIRUBIN URINE: NEGATIVE
HGB URINE DIPSTICK: NEGATIVE
Ketones, ur: NEGATIVE
Nitrite: NEGATIVE
PH: 5.5 (ref 5.0–8.0)
RBC / HPF: NONE SEEN (ref 0–?)
Specific Gravity, Urine: <=1.005 — AB (ref 1.000–1.030)
Total Protein, Urine: NEGATIVE
Urine Glucose: NEGATIVE
Urobilinogen, UA: 0.2 (ref 0.0–1.0)

## 2017-11-17 LAB — TSH: TSH: 3.13 u[IU]/mL (ref 0.35–4.50)

## 2017-11-17 NOTE — Progress Notes (Signed)
Patient ID: Sherry Bernard, female   DOB: June 17, 1941, 77 y.o.   MRN: [id number]   Subjective:    Patient ID: Sherry Bernard, female    DOB: July 10, 1941, 77 y.o.   MRN: [id number]  HPI  Patient here for a scheduled follow up.  Bowels doing better.  Taking daily fiber and probiotics.  Saw GI yesterday.  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  Better on current regimen.  She reports overall feeling better.  Is a little anxious.  Tries to stay active.  No chest pain.  Breathing stable.  No abdominal pain.  Some discomfort in the right side of her back.  No severe pain.  Discussed further evaluation.  She wants to monitor.     Past Medical History:  Diagnosis Date  . Colon polyp   . Hyperthyroidism    Past Surgical History:  Procedure Laterality Date  . APPENDECTOMY     child  . TONSILECTOMY, ADENOIDECTOMY, BILATERAL MYRINGOTOMY AND TUBES     child   Family History  Problem Relation Age of Onset  . Breast cancer Mother    Social History   Socioeconomic History  . Marital status: Married    Spouse name: None  . Number of children: 3  . Years of education: None  . Highest education level: None  Social Needs  . Financial resource strain: None  . Food insecurity - worry: None  . Food insecurity - inability: None  . Transportation needs - medical: None  . Transportation needs - non-medical: None  Occupational History  . Occupation: retired  Tobacco Use  . Smoking status: Never Smoker  . Smokeless tobacco: Never Used  Substance and Sexual Activity  . Alcohol use: Yes    Comment: socially  . Drug use: No  . Sexual activity: Yes  Other Topics Concern  . None  Social History Narrative  . None    Outpatient Encounter Medications as of 11/17/2017  Medication Sig  . Biotin 5 MG CAPS Take by mouth.  . Calcium Carbonate-Simethicone 1000-60 MG CHEW Chew by mouth.  . Glucosamine HCl-MSM (SM GLUCOSAMINE HCL-MSM) 750-750 MG TABS Take by mouth.  . levothyroxine (SYNTHROID, LEVOTHROID) 75 MCG tablet Take 1  tablet (75 mcg total) daily by mouth.  . Multiple Vitamins-Minerals (CENTRUM SILVER 50+WOMEN) TABS Take 1 tablet by mouth daily.  . mupirocin ointment (BACTROBAN) 2 % Apply to affected area bid  . Omega-3 Fatty Acids (OMEGA-3 FISH OIL) 1200 MG CAPS Take by mouth.  . Probiotic Product (PROBIOTIC-10) CAPS Take by mouth.  Marland Kitchen XIIDRA 5 % SOLN Place 1 drop into both eyes as needed.   No facility-administered encounter medications on file as of 11/17/2017.     Review of Systems  Constitutional: Negative for appetite change and unexpected weight change.  HENT: Negative for congestion and sinus pressure.   Respiratory: Negative for cough, chest tightness and shortness of breath.   Cardiovascular: Negative for chest pain, palpitations and leg swelling.  Gastrointestinal: Negative for abdominal pain, nausea and vomiting.       Bowels better.    Genitourinary: Negative for difficulty urinating, dysuria and hematuria.  Musculoskeletal: Negative for joint swelling and myalgias.  Skin: Negative for color change and rash.  Neurological: Negative for dizziness, light-headedness and headaches.  Psychiatric/Behavioral: Negative for dysphoric mood.       A little anxious.         Objective:     Pulse ox recheck 98%  Physical Exam  Constitutional: She appears well-developed and  well-nourished. No distress.  HENT:  Nose: Nose normal.  Mouth/Throat: Oropharynx is clear and moist.  Neck: Neck supple. No thyromegaly present.  Cardiovascular: Normal rate and regular rhythm.  Pulmonary/Chest: Breath sounds normal. No respiratory distress. She has no wheezes.  Good breath sounds bilaterally.    Abdominal: Soft. Bowel sounds are normal. There is no tenderness.  Musculoskeletal: She exhibits no edema or tenderness.  Lymphadenopathy:    She has no cervical adenopathy.  Skin: No rash noted. No erythema.  Psychiatric: She has a normal mood and affect. Her behavior is normal.    BP 138/70   Pulse 72    Temp (!) 97.3 F (36.3 C) (Oral)   Ht 5\' 3"  (1.6 m)   Wt 124 lb 3.2 oz (56.3 kg)   SpO2 98%   BMI 22.00 kg/m  Wt Readings from Last 3 Encounters:  11/17/17 124 lb 3.2 oz (56.3 kg)  08/16/17 123 lb 6.4 oz (56 kg)  05/04/17 121 lb 6.4 oz (55.1 kg)     Lab Results  Component Value Date   WBC 4.4 01/12/2017   HGB 14.3 01/12/2017   HCT 41.8 01/12/2017   PLT 234.0 01/12/2017   GLUCOSE 94 01/12/2017   CHOL 218 (H) 01/12/2017   TRIG 67.0 01/12/2017   HDL 86.20 01/12/2017   LDLCALC 118 (H) 01/12/2017   ALT 16 01/12/2017   AST 22 01/12/2017   NA 142 01/12/2017   K 4.0 01/12/2017   CL 105 01/12/2017   CREATININE 0.83 01/12/2017   BUN 20 01/12/2017   CO2 31 01/12/2017   TSH 3.13 11/17/2017   INR 0.8 02/29/2012       Assessment & Plan:   Problem List Items Addressed This Visit    Acquired hypothyroidism    On thyroid replacement.  Follow tsh.       Relevant Orders   TSH (Completed)   Anxiety    Overall stable.  Has desired no further intervention.  Follow.       Cyst of pancreas    Had MRI/MRCP 01/2017.  Recommended f/u in 18 months.        Diarrhea    Seeing GI.  Just evaluated yesterday.  Better.  Taking fiber and probiotics.  Follow.        Hematuria - Primary    Previous gross hematuria.  Treated for infection, but no infection present on culture.  Discussed my desire for further evaluation.  She declines.  Declines any further testing or referral to urology.  Did agree to have urine rechecked.        Relevant Orders   Urinalysis, Routine w reflex microscopic (Completed)   Low back pain    Had previous xray with arthritis changes and stable compression fracture.  Desires no further intervention.           Einar Pheasant, MD

## 2017-11-20 ENCOUNTER — Encounter: Payer: Self-pay | Admitting: Internal Medicine

## 2017-11-20 NOTE — Assessment & Plan Note (Signed)
Had MRI/MRCP 01/2017.  Recommended f/u in 18 months.

## 2017-11-20 NOTE — Assessment & Plan Note (Signed)
Seeing GI.  Just evaluated yesterday.  Better.  Taking fiber and probiotics.  Follow.

## 2017-11-20 NOTE — Assessment & Plan Note (Signed)
Overall stable.  Has desired no further intervention.  Follow.

## 2017-11-20 NOTE — Assessment & Plan Note (Signed)
Previous gross hematuria.  Treated for infection, but no infection present on culture.  Discussed my desire for further evaluation.  She declines.  Declines any further testing or referral to urology.  Did agree to have urine rechecked.

## 2017-11-20 NOTE — Assessment & Plan Note (Signed)
Had previous xray with arthritis changes and stable compression fracture.  Desires no further intervention.

## 2017-11-20 NOTE — Assessment & Plan Note (Signed)
On thyroid replacement.  Follow tsh.  

## 2017-11-25 DIAGNOSIS — K580 Irritable bowel syndrome with diarrhea: Secondary | ICD-10-CM | POA: Diagnosis not present

## 2017-12-23 DIAGNOSIS — L821 Other seborrheic keratosis: Secondary | ICD-10-CM | POA: Diagnosis not present

## 2017-12-23 DIAGNOSIS — R238 Other skin changes: Secondary | ICD-10-CM | POA: Diagnosis not present

## 2017-12-23 DIAGNOSIS — B078 Other viral warts: Secondary | ICD-10-CM | POA: Diagnosis not present

## 2018-02-10 ENCOUNTER — Other Ambulatory Visit: Payer: Self-pay | Admitting: Gastroenterology

## 2018-02-10 DIAGNOSIS — K862 Cyst of pancreas: Secondary | ICD-10-CM

## 2018-02-15 ENCOUNTER — Ambulatory Visit: Payer: Self-pay | Admitting: Internal Medicine

## 2018-02-17 DIAGNOSIS — M25512 Pain in left shoulder: Secondary | ICD-10-CM | POA: Diagnosis not present

## 2018-03-08 ENCOUNTER — Encounter: Payer: Self-pay | Admitting: Internal Medicine

## 2018-03-08 ENCOUNTER — Ambulatory Visit (INDEPENDENT_AMBULATORY_CARE_PROVIDER_SITE_OTHER): Payer: PPO | Admitting: Internal Medicine

## 2018-03-08 ENCOUNTER — Other Ambulatory Visit: Payer: Self-pay | Admitting: Unknown Physician Specialty

## 2018-03-08 VITALS — BP 118/76 | HR 70 | Temp 97.6°F | Resp 18 | Wt 121.4 lb

## 2018-03-08 DIAGNOSIS — Z13220 Encounter for screening for lipoid disorders: Secondary | ICD-10-CM | POA: Diagnosis not present

## 2018-03-08 DIAGNOSIS — R319 Hematuria, unspecified: Secondary | ICD-10-CM

## 2018-03-08 DIAGNOSIS — R0602 Shortness of breath: Secondary | ICD-10-CM | POA: Diagnosis not present

## 2018-03-08 DIAGNOSIS — F419 Anxiety disorder, unspecified: Secondary | ICD-10-CM | POA: Diagnosis not present

## 2018-03-08 DIAGNOSIS — R197 Diarrhea, unspecified: Secondary | ICD-10-CM | POA: Diagnosis not present

## 2018-03-08 DIAGNOSIS — E2839 Other primary ovarian failure: Secondary | ICD-10-CM

## 2018-03-08 DIAGNOSIS — E039 Hypothyroidism, unspecified: Secondary | ICD-10-CM | POA: Diagnosis not present

## 2018-03-08 DIAGNOSIS — M25512 Pain in left shoulder: Secondary | ICD-10-CM

## 2018-03-08 NOTE — Progress Notes (Signed)
Patient ID: Sherry Bernard, female   DOB: [birth year], 77 y.o.   MRN: [id number]   Subjective:    Patient ID: Sherry Bernard, female    DOB: 08-06-1941, 77 y.o.   MRN: [id number]  HPI  Patient here for a scheduled follow up. She recently suffered a shoulder injury.  Hyperextended her arm.  Seeing ortho. Persistent pain.  Planning to follow up with ortho and planning for probable MRI.  Tries to stay active.  Has noticed some sob with walking.  Discussed with her today. Discussed further w/up and cardiac evaluation.  She declined.  Declined EKG and further w/up.  Feels related to deconditioning.  No chest pain.  No acid reflux.  No abdominal pain or cramping.  Bowels stable.  Saw GI.  Diagnosed with IBSD.  Discussed with her regarding referral to urology for evaluation of hematuria.  She declines.     Past Medical History:  Diagnosis Date  . Colon polyp   . Hyperthyroidism    Past Surgical History:  Procedure Laterality Date  . APPENDECTOMY     child  . TONSILECTOMY, ADENOIDECTOMY, BILATERAL MYRINGOTOMY AND TUBES     child   Family History  Problem Relation Age of Onset  . Breast cancer Mother    Social History   Socioeconomic History  . Marital status: Married    Spouse name: Not on file  . Number of children: 3  . Years of education: Not on file  . Highest education level: Not on file  Occupational History  . Occupation: retired  Scientific laboratory technician  . Financial resource strain: Not on file  . Food insecurity:    Worry: Not on file    Inability: Not on file  . Transportation needs:    Medical: Not on file    Non-medical: Not on file  Tobacco Use  . Smoking status: Never Smoker  . Smokeless tobacco: Never Used  Substance and Sexual Activity  . Alcohol use: Yes    Comment: socially  . Drug use: No  . Sexual activity: Yes  Lifestyle  . Physical activity:    Days per week: Not on file    Minutes per session: Not on file  . Stress: Not on file  Relationships  . Social  connections:    Talks on phone: Not on file    Gets together: Not on file    Attends religious service: Not on file    Active member of club or organization: Not on file    Attends meetings of clubs or organizations: Not on file    Relationship status: Not on file  Other Topics Concern  . Not on file  Social History Narrative  . Not on file    Outpatient Encounter Medications as of 03/08/2018  Medication Sig  . Biotin 5 MG CAPS Take by mouth.  . Calcium Carbonate-Simethicone 1000-60 MG CHEW Chew by mouth.  . Glucosamine HCl-MSM (SM GLUCOSAMINE HCL-MSM) 750-750 MG TABS Take by mouth.  . levothyroxine (SYNTHROID, LEVOTHROID) 75 MCG tablet Take 1 tablet (75 mcg total) daily by mouth.  . Multiple Vitamins-Minerals (CENTRUM SILVER 50+WOMEN) TABS Take 1 tablet by mouth daily.  . mupirocin ointment (BACTROBAN) 2 % Apply to affected area bid  . Omega-3 Fatty Acids (OMEGA-3 FISH OIL) 1200 MG CAPS Take by mouth.  . Probiotic Product (PROBIOTIC-10) CAPS Take by mouth.  Marland Kitchen XIIDRA 5 % SOLN Place 1 drop into both eyes as needed.   No facility-administered encounter medications on file as  of 03/08/2018.     Review of Systems  Constitutional: Negative for appetite change and unexpected weight change.  HENT: Negative for congestion and sinus pressure.   Respiratory: Negative for cough and chest tightness.        Some sob with exertion.    Cardiovascular: Negative for chest pain, palpitations and leg swelling.  Gastrointestinal: Negative for abdominal pain, nausea and vomiting.       Bowels stable.    Genitourinary: Negative for difficulty urinating and dysuria.  Musculoskeletal: Negative for joint swelling and myalgias.  Skin: Negative for color change and rash.  Neurological: Negative for dizziness, light-headedness and headaches.  Psychiatric/Behavioral: Negative for agitation and dysphoric mood.       Objective:    Physical Exam  Constitutional: She appears well-developed and  well-nourished. No distress.  HENT:  Nose: Nose normal.  Mouth/Throat: Oropharynx is clear and moist.  Neck: Neck supple. No thyromegaly present.  Cardiovascular: Normal rate and regular rhythm.  Pulmonary/Chest: Breath sounds normal. No respiratory distress. She has no wheezes.  Abdominal: Soft. Bowel sounds are normal. There is no tenderness.  Musculoskeletal: She exhibits no edema or tenderness.  Limited rom - left shoulder.    Lymphadenopathy:    She has no cervical adenopathy.  Skin: No rash noted. No erythema.  Psychiatric: She has a normal mood and affect. Her behavior is normal.    BP 118/76 (BP Location: Left Arm, Patient Position: Sitting, Cuff Size: Normal)   Pulse 70   Temp 97.6 F (36.4 C) (Oral)   Resp 18   Wt 121 lb 6.4 oz (55.1 kg)   SpO2 96%   BMI 21.51 kg/m  Wt Readings from Last 3 Encounters:  03/08/18 121 lb 6.4 oz (55.1 kg)  11/17/17 124 lb 3.2 oz (56.3 kg)  08/16/17 123 lb 6.4 oz (56 kg)     Lab Results  Component Value Date   WBC 4.4 01/12/2017   HGB 14.3 01/12/2017   HCT 41.8 01/12/2017   PLT 234.0 01/12/2017   GLUCOSE 94 01/12/2017   CHOL 218 (H) 01/12/2017   TRIG 67.0 01/12/2017   HDL 86.20 01/12/2017   LDLCALC 118 (H) 01/12/2017   ALT 16 01/12/2017   AST 22 01/12/2017   NA 142 01/12/2017   K 4.0 01/12/2017   CL 105 01/12/2017   CREATININE 0.83 01/12/2017   BUN 20 01/12/2017   CO2 31 01/12/2017   TSH 3.13 11/17/2017   INR 0.8 02/29/2012       Assessment & Plan:   Problem List Items Addressed This Visit    Acquired hypothyroidism    On thyroid replacement.  Follow tsh.        Anxiety    Overall stable.        Relevant Orders   TSH   Diarrhea    Saw GI.  Diagnosed with IBSD.  Stable.       Hematuria    Discussed referral to urology.  She declines.  Follow.       Left shoulder pain    Previous shoulder injury as outlined.  Saw ortho.  Persistent pain.  Planning for f/u with ortho.  Probable - MRI needed.         SOB (shortness of breath) on exertion    Declines any further w/up.  Feels related to deconditioning.  Wants to monitor.        Relevant Orders   CBC with Differential/Platelet   Hepatic function panel   Basic metabolic panel  Other Visit Diagnoses    Estrogen deficiency    -  Primary   Relevant Orders   DG Bone Density   Screening cholesterol level       Relevant Orders   Lipid panel       Einar Pheasant, MD

## 2018-03-12 ENCOUNTER — Encounter: Payer: Self-pay | Admitting: Internal Medicine

## 2018-03-12 DIAGNOSIS — M25512 Pain in left shoulder: Secondary | ICD-10-CM | POA: Insufficient documentation

## 2018-03-12 DIAGNOSIS — R0602 Shortness of breath: Secondary | ICD-10-CM | POA: Insufficient documentation

## 2018-03-12 NOTE — Assessment & Plan Note (Signed)
On thyroid replacement.  Follow tsh.  

## 2018-03-12 NOTE — Assessment & Plan Note (Signed)
Overall stable.   

## 2018-03-12 NOTE — Assessment & Plan Note (Signed)
Previous shoulder injury as outlined.  Saw ortho.  Persistent pain.  Planning for f/u with ortho.  Probable - MRI needed.

## 2018-03-12 NOTE — Assessment & Plan Note (Signed)
Discussed referral to urology.  She declines.  Follow.

## 2018-03-12 NOTE — Assessment & Plan Note (Signed)
Saw GI.  Diagnosed with IBSD.  Stable.

## 2018-03-12 NOTE — Assessment & Plan Note (Signed)
Declines any further w/up.  Feels related to deconditioning.  Wants to monitor.

## 2018-03-24 ENCOUNTER — Encounter: Payer: Self-pay | Admitting: Internal Medicine

## 2018-03-30 DIAGNOSIS — L821 Other seborrheic keratosis: Secondary | ICD-10-CM | POA: Diagnosis not present

## 2018-03-31 ENCOUNTER — Ambulatory Visit
Admission: RE | Admit: 2018-03-31 | Discharge: 2018-03-31 | Disposition: A | Discharge status: Home / Self Care | Payer: PPO | Source: Ambulatory Visit | Attending: Unknown Physician Specialty | Admitting: Unknown Physician Specialty

## 2018-03-31 DIAGNOSIS — M25512 Pain in left shoulder: Secondary | ICD-10-CM | POA: Insufficient documentation

## 2018-03-31 DIAGNOSIS — S4292XA Fracture of left shoulder girdle, part unspecified, initial encounter for closed fracture: Secondary | ICD-10-CM | POA: Insufficient documentation

## 2018-03-31 DIAGNOSIS — S42255A Nondisplaced fracture of greater tuberosity of left humerus, initial encounter for closed fracture: Secondary | ICD-10-CM | POA: Diagnosis not present

## 2018-03-31 DIAGNOSIS — X58XXXA Exposure to other specified factors, initial encounter: Secondary | ICD-10-CM | POA: Diagnosis not present

## 2018-03-31 DIAGNOSIS — S46812A Strain of other muscles, fascia and tendons at shoulder and upper arm level, left arm, initial encounter: Secondary | ICD-10-CM | POA: Insufficient documentation

## 2018-04-07 ENCOUNTER — Encounter: Payer: Self-pay | Admitting: Internal Medicine

## 2018-04-07 DIAGNOSIS — M8589 Other specified disorders of bone density and structure, multiple sites: Secondary | ICD-10-CM | POA: Diagnosis not present

## 2018-04-11 ENCOUNTER — Other Ambulatory Visit: Payer: Self-pay | Admitting: Internal Medicine

## 2018-04-20 ENCOUNTER — Telehealth: Payer: Self-pay

## 2018-04-20 NOTE — Telephone Encounter (Signed)
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 Patient stated that she was taking one calcium supplement a day & would discuss taking the two when she comes in for visit. She cannot do weight bearing exercises at this time due to a shoulder injury.

## 2018-04-21 ENCOUNTER — Ambulatory Visit: Payer: PPO

## 2018-05-02 DIAGNOSIS — M25512 Pain in left shoulder: Secondary | ICD-10-CM | POA: Diagnosis not present

## 2018-05-02 DIAGNOSIS — M8580 Other specified disorders of bone density and structure, unspecified site: Secondary | ICD-10-CM | POA: Diagnosis not present

## 2018-05-02 DIAGNOSIS — S42255D Nondisplaced fracture of greater tuberosity of left humerus, subsequent encounter for fracture with routine healing: Secondary | ICD-10-CM | POA: Diagnosis not present

## 2018-05-04 ENCOUNTER — Ambulatory Visit (INDEPENDENT_AMBULATORY_CARE_PROVIDER_SITE_OTHER): Payer: PPO

## 2018-05-04 VITALS — BP 124/78 | HR 77 | Temp 98.0°F | Resp 14 | Ht 62.0 in | Wt 120.1 lb

## 2018-05-04 DIAGNOSIS — Z0000 Encounter for general adult medical examination without abnormal findings: Secondary | ICD-10-CM | POA: Diagnosis not present

## 2018-05-04 NOTE — Progress Notes (Addendum)
Subjective:   Sherry Bernard is a 77 y.o. female who presents for Medicare Annual (Subsequent) preventive examination.  Review of Systems:  No ROS.  Medicare Wellness Visit. Additional risk factors are reflected in the social history. Cardiac Risk Factors include: advanced age (>25men, >37 women)     Objective:     Vitals: BP 124/78 (BP Location: Right Arm, Patient Position: Sitting, Cuff Size: Normal)   Pulse 77   Temp 98 F (36.7 C) (Oral)   Resp 14   Ht 5\' 2"  (1.575 m)   Wt 120 lb 1.9 oz (54.5 kg)   SpO2 98%   BMI 21.97 kg/m   Body mass index is 21.97 kg/m.  Advanced Directives 05/04/2018 05/03/2017 01/20/2016  Does Patient Have a Medical Advance Directive? Yes Yes No  Type of Paramedic of [place];Living will Living will -  Does patient want to make changes to medical advance directive? No - Patient declined No - Patient declined -  Copy of [place] in Chart? No - copy requested - -  Would patient like information on creating a medical advance directive? - - Yes - Scientist, clinical (histocompatibility and immunogenetics) given    Tobacco Social History   Tobacco Use  Smoking Status Never Smoker  Smokeless Tobacco Never Used     Counseling given: Not Answered   Clinical Intake:  Pre-visit preparation completed: Yes  Pain : 0-10 Pain Score: 9  Pain Type: Chronic pain Pain Location: Arm Pain Orientation: Left Pain Descriptors / Indicators: Aching Pain Onset: More than a month ago Pain Relieving Factors: Extra strength tylenol; phuysical therapy Effect of Pain on Daily Activities: She paces herself between activities  Pain Relieving Factors: Extra strength tylenol; phuysical therapy  Nutritional Status: BMI of 19-24  Normal Diabetes: No  How often do you need to have someone help you when you read instructions, pamphlets, or other written materials from your doctor or pharmacy?: 1 - Never  Interpreter Needed?: No     Past Medical History:    Diagnosis Date  . Colon polyp   . Hyperthyroidism    Past Surgical History:  Procedure Laterality Date  . APPENDECTOMY     child  . TONSILECTOMY, ADENOIDECTOMY, BILATERAL MYRINGOTOMY AND TUBES     child   Family History  Problem Relation Age of Onset  . Breast cancer Mother    Social History   Socioeconomic History  . Marital status: Married    Spouse name: Not on file  . Number of children: 3  . Years of education: Not on file  . Highest education level: Not on file  Occupational History  . Occupation: retired  Scientific laboratory technician  . Financial resource strain: Not hard at all  . Food insecurity:    Worry: Never true    Inability: Never true  . Transportation needs:    Medical: No    Non-medical: No  Tobacco Use  . Smoking status: Never Smoker  . Smokeless tobacco: Never Used  Substance and Sexual Activity  . Alcohol use: Yes    Comment: socially  . Drug use: No  . Sexual activity: Yes  Lifestyle  . Physical activity:    Days per week: 7 days    Minutes per session: 60 min  . Stress: Not at all  Relationships  . Social connections:    Talks on phone: Not on file    Gets together: Not on file    Attends religious service: Not on file  Active member of club or organization: Not on file    Attends meetings of clubs or organizations: Not on file    Relationship status: Not on file  Other Topics Concern  . Not on file  Social History Narrative  . Not on file    Outpatient Encounter Medications as of 05/04/2018  Medication Sig  . Biotin 5 MG CAPS Take by mouth.  . Calcium Carbonate-Simethicone 1000-60 MG CHEW Chew by mouth.  . Calcium-Phosphorus-Vitamin D (CITRACAL +D3 PO) Take 2 tablets by mouth daily.  . Glucosamine HCl-MSM (SM GLUCOSAMINE HCL-MSM) 750-750 MG TABS Take by mouth.  . levothyroxine (SYNTHROID, LEVOTHROID) 75 MCG tablet TAKE 1 TABLET BY MOUTH DAILY BEFORE BREAKFAST  . Multiple Vitamins-Minerals (CENTRUM SILVER 50+WOMEN) TABS Take 1 tablet by  mouth daily.  . mupirocin ointment (BACTROBAN) 2 % Apply to affected area bid  . Omega-3 Fatty Acids (OMEGA-3 FISH OIL) 1200 MG CAPS Take by mouth.  . Probiotic Product (PROBIOTIC-10) CAPS Take by mouth.  Marland Kitchen XIIDRA 5 % SOLN Place 1 drop into both eyes as needed.   No facility-administered encounter medications on file as of 05/04/2018.     Activities of Daily Living In your present state of health, do you have any difficulty performing the following activities: 05/04/2018  Hearing? N  Vision? N  Difficulty concentrating or making decisions? N  Walking or climbing stairs? N  Dressing or bathing? N  Doing errands, shopping? N  Preparing Food and eating ? N  Using the Toilet? N  In the past six months, have you accidently leaked urine? N  Do you have problems with loss of bowel control? N  Managing your Medications? N  Managing your Finances? N  Housekeeping or managing your Housekeeping? Y  Comment Housekeeper assists  Some recent data might be hidden    Patient Care Team: Einar Pheasant, MD as PCP - General (Internal Medicine)    Assessment:   This is a routine wellness examination for [place].  The goal of the wellness visit is to assist the patient how to close the gaps in care and create a preventative care plan for the patient.   The roster of all physicians providing medical care to patient is listed in the Snapshot section of the chart.  Taking calcium VIT D as appropriate/Osteoporosis risk reviewed.    Safety issues reviewed; Smoke and carbon monoxide detectors in the home. No firearms in the home. Wears seatbelts when driving or riding with others. No violence in the home.  They do not have excessive sun exposure.  Discussed the need for sun protection: hats, long sleeves and the use of sunscreen if there is significant sun exposure.  Patient is alert, normal appearance, oriented to person/place/and time. Correctly identified the president of the [place] and recalls of 2/3  words.Performs simple calculations and can read correct time from watch face. Displays appropriate judgement.  No new identified risk were noted.  No failures at ADL's or IADL's.    BMI- discussed the importance of a healthy diet, water intake and the benefits of aerobic exercise. Educational material provided.   24 hour diet recall: Regular diet  Dental- every 4 months.  Sleep patterns- Sleeps 6-7 hours at night.  Wakes feeling rested. Sleeps in a recliner due to chronic arm pain.  Health maintenance gaps- closed.  Patient Concerns: None at this time. Follow up with PCP as needed.  Exercise Activities and Dietary recommendations Current Exercise Habits: Home exercise routine, Type of exercise: walking, Time (  Minutes): 60, Frequency (Times/Week): 7, Weekly Exercise (Minutes/Week): 420, Intensity: Mild  Goals    . Healthy Lifestyle     Read more  Stay hydrated Eat healthy Give your arm time to heal       Fall Risk Fall Risk  05/04/2018 05/03/2017 09/30/2016  Falls in the past year? No No No   Depression Screen PHQ 2/9 Scores 05/04/2018 05/03/2017 09/30/2016  PHQ - 2 Score 0 0 0  PHQ- 9 Score - - 1     Cognitive Function MMSE - Mini Mental State Exam 05/03/2017  Orientation to time 5  Orientation to Place 5  Registration 3  Attention/ Calculation 5  Recall 3  Language- name 2 objects 2  Language- repeat 1  Language- follow 3 step command 3  Language- read & follow direction 1  Write a sentence 1  Copy design 1  Total score 30     6CIT Screen 05/04/2018  What Year? 0 points  What month? 0 points  What time? 0 points  Count back from 20 0 points  Months in reverse 0 points  Repeat phrase 0 points  Total Score 0    Immunization History  Administered Date(s) Administered  . Influenza-Unspecified 07/22/2015, 07/11/2016, 07/06/2017  . Pneumococcal Conjugate-13 08/04/2013, 07/22/2015  . Pneumococcal Polysaccharide-23 05/03/2017  . Tdap 08/10/2014, 10/09/2014   . Zoster Recombinat (Shingrix) 01/05/2018    Screening Tests Health Maintenance  Topic Date Due  . INFLUENZA VACCINE  05/12/2018  . TETANUS/TDAP  10/09/2024  . DEXA SCAN  Completed  . PNA vac Low Risk Adult  Completed      Plan:    End of life planning; Advance aging; Advanced directives discussed. Copy of current HCPOA/Living Will requested.    I have personally reviewed and noted the following in the patient's chart:   . Medical and social history . Use of alcohol, tobacco or illicit drugs  . Current medications and supplements . Functional ability and status . Nutritional status . Physical activity . Advanced directives . List of other physicians . Hospitalizations, surgeries, and ER visits in previous 12 months . Vitals . Screenings to include cognitive, depression, and falls . Referrals and appointments  In addition, I have reviewed and discussed with patient certain preventive protocols, quality metrics, and best practice recommendations. A written personalized care plan for preventive services as well as general preventive health recommendations were provided to patient.     Varney Biles, LPN  [id number]   Reviewed above information.  Agree with assessment and plan.    Dr Nicki Reaper

## 2018-05-04 NOTE — Patient Instructions (Addendum)
  Sherry Bernard , Thank you for taking time to come for your Medicare Wellness Visit. I appreciate your ongoing commitment to your health goals. Please review the following plan we discussed and let me know if I can assist you in the future.   These are the goals we discussed: Goals    . Healthy Lifestyle     Read more  Stay hydrated Eat healthy Give your arm time to heal       This is a list of the screening recommended for you and due dates:  Health Maintenance  Topic Date Due  . Flu Shot  05/12/2018  . Tetanus Vaccine  10/09/2024  . DEXA scan (bone density measurement)  Completed  . Pneumonia vaccines  Completed

## 2018-05-08 DIAGNOSIS — R319 Hematuria, unspecified: Secondary | ICD-10-CM | POA: Diagnosis not present

## 2018-05-08 DIAGNOSIS — N3001 Acute cystitis with hematuria: Secondary | ICD-10-CM | POA: Diagnosis not present

## 2018-05-09 DIAGNOSIS — R319 Hematuria, unspecified: Secondary | ICD-10-CM | POA: Diagnosis not present

## 2018-05-11 DIAGNOSIS — M6281 Muscle weakness (generalized): Secondary | ICD-10-CM | POA: Diagnosis not present

## 2018-05-11 DIAGNOSIS — S42252A Displaced fracture of greater tuberosity of left humerus, initial encounter for closed fracture: Secondary | ICD-10-CM | POA: Diagnosis not present

## 2018-05-11 DIAGNOSIS — M25612 Stiffness of left shoulder, not elsewhere classified: Secondary | ICD-10-CM | POA: Diagnosis not present

## 2018-05-11 DIAGNOSIS — M25512 Pain in left shoulder: Secondary | ICD-10-CM | POA: Diagnosis not present

## 2018-05-16 DIAGNOSIS — M6281 Muscle weakness (generalized): Secondary | ICD-10-CM | POA: Diagnosis not present

## 2018-05-16 DIAGNOSIS — M25612 Stiffness of left shoulder, not elsewhere classified: Secondary | ICD-10-CM | POA: Diagnosis not present

## 2018-05-16 DIAGNOSIS — S42252A Displaced fracture of greater tuberosity of left humerus, initial encounter for closed fracture: Secondary | ICD-10-CM | POA: Diagnosis not present

## 2018-05-16 DIAGNOSIS — M25512 Pain in left shoulder: Secondary | ICD-10-CM | POA: Diagnosis not present

## 2018-05-18 DIAGNOSIS — M25612 Stiffness of left shoulder, not elsewhere classified: Secondary | ICD-10-CM | POA: Diagnosis not present

## 2018-05-18 DIAGNOSIS — S42252A Displaced fracture of greater tuberosity of left humerus, initial encounter for closed fracture: Secondary | ICD-10-CM | POA: Diagnosis not present

## 2018-05-18 DIAGNOSIS — M6281 Muscle weakness (generalized): Secondary | ICD-10-CM | POA: Diagnosis not present

## 2018-05-24 DIAGNOSIS — M25612 Stiffness of left shoulder, not elsewhere classified: Secondary | ICD-10-CM | POA: Diagnosis not present

## 2018-05-24 DIAGNOSIS — M6281 Muscle weakness (generalized): Secondary | ICD-10-CM | POA: Diagnosis not present

## 2018-05-24 DIAGNOSIS — S42252A Displaced fracture of greater tuberosity of left humerus, initial encounter for closed fracture: Secondary | ICD-10-CM | POA: Diagnosis not present

## 2018-05-24 DIAGNOSIS — M25512 Pain in left shoulder: Secondary | ICD-10-CM | POA: Diagnosis not present

## 2018-05-26 DIAGNOSIS — M25512 Pain in left shoulder: Secondary | ICD-10-CM | POA: Diagnosis not present

## 2018-05-26 DIAGNOSIS — M6281 Muscle weakness (generalized): Secondary | ICD-10-CM | POA: Diagnosis not present

## 2018-05-26 DIAGNOSIS — S42252A Displaced fracture of greater tuberosity of left humerus, initial encounter for closed fracture: Secondary | ICD-10-CM | POA: Diagnosis not present

## 2018-05-26 DIAGNOSIS — M25612 Stiffness of left shoulder, not elsewhere classified: Secondary | ICD-10-CM | POA: Diagnosis not present

## 2018-05-31 DIAGNOSIS — M6281 Muscle weakness (generalized): Secondary | ICD-10-CM | POA: Diagnosis not present

## 2018-05-31 DIAGNOSIS — M25512 Pain in left shoulder: Secondary | ICD-10-CM | POA: Diagnosis not present

## 2018-05-31 DIAGNOSIS — S42252A Displaced fracture of greater tuberosity of left humerus, initial encounter for closed fracture: Secondary | ICD-10-CM | POA: Diagnosis not present

## 2018-05-31 DIAGNOSIS — M25612 Stiffness of left shoulder, not elsewhere classified: Secondary | ICD-10-CM | POA: Diagnosis not present

## 2018-06-02 DIAGNOSIS — M25512 Pain in left shoulder: Secondary | ICD-10-CM | POA: Diagnosis not present

## 2018-06-02 DIAGNOSIS — M6281 Muscle weakness (generalized): Secondary | ICD-10-CM | POA: Diagnosis not present

## 2018-06-02 DIAGNOSIS — M25612 Stiffness of left shoulder, not elsewhere classified: Secondary | ICD-10-CM | POA: Diagnosis not present

## 2018-06-02 DIAGNOSIS — S42252A Displaced fracture of greater tuberosity of left humerus, initial encounter for closed fracture: Secondary | ICD-10-CM | POA: Diagnosis not present

## 2018-06-07 DIAGNOSIS — M25612 Stiffness of left shoulder, not elsewhere classified: Secondary | ICD-10-CM | POA: Diagnosis not present

## 2018-06-07 DIAGNOSIS — S42252A Displaced fracture of greater tuberosity of left humerus, initial encounter for closed fracture: Secondary | ICD-10-CM | POA: Diagnosis not present

## 2018-06-07 DIAGNOSIS — M25512 Pain in left shoulder: Secondary | ICD-10-CM | POA: Diagnosis not present

## 2018-06-07 DIAGNOSIS — M6281 Muscle weakness (generalized): Secondary | ICD-10-CM | POA: Diagnosis not present

## 2018-06-09 DIAGNOSIS — M25612 Stiffness of left shoulder, not elsewhere classified: Secondary | ICD-10-CM | POA: Diagnosis not present

## 2018-06-09 DIAGNOSIS — M25512 Pain in left shoulder: Secondary | ICD-10-CM | POA: Diagnosis not present

## 2018-06-09 DIAGNOSIS — M6281 Muscle weakness (generalized): Secondary | ICD-10-CM | POA: Diagnosis not present

## 2018-06-09 DIAGNOSIS — S42252A Displaced fracture of greater tuberosity of left humerus, initial encounter for closed fracture: Secondary | ICD-10-CM | POA: Diagnosis not present

## 2018-06-14 DIAGNOSIS — S42252A Displaced fracture of greater tuberosity of left humerus, initial encounter for closed fracture: Secondary | ICD-10-CM | POA: Diagnosis not present

## 2018-06-14 DIAGNOSIS — M6281 Muscle weakness (generalized): Secondary | ICD-10-CM | POA: Diagnosis not present

## 2018-06-14 DIAGNOSIS — M25512 Pain in left shoulder: Secondary | ICD-10-CM | POA: Diagnosis not present

## 2018-06-14 DIAGNOSIS — M25612 Stiffness of left shoulder, not elsewhere classified: Secondary | ICD-10-CM | POA: Diagnosis not present

## 2018-06-22 DIAGNOSIS — M6281 Muscle weakness (generalized): Secondary | ICD-10-CM | POA: Diagnosis not present

## 2018-06-22 DIAGNOSIS — M25512 Pain in left shoulder: Secondary | ICD-10-CM | POA: Diagnosis not present

## 2018-06-22 DIAGNOSIS — M25612 Stiffness of left shoulder, not elsewhere classified: Secondary | ICD-10-CM | POA: Diagnosis not present

## 2018-06-22 DIAGNOSIS — S42252A Displaced fracture of greater tuberosity of left humerus, initial encounter for closed fracture: Secondary | ICD-10-CM | POA: Diagnosis not present

## 2018-06-24 DIAGNOSIS — S42252A Displaced fracture of greater tuberosity of left humerus, initial encounter for closed fracture: Secondary | ICD-10-CM | POA: Diagnosis not present

## 2018-06-24 DIAGNOSIS — M6281 Muscle weakness (generalized): Secondary | ICD-10-CM | POA: Diagnosis not present

## 2018-06-27 DIAGNOSIS — S42252D Displaced fracture of greater tuberosity of left humerus, subsequent encounter for fracture with routine healing: Secondary | ICD-10-CM | POA: Diagnosis not present

## 2018-06-28 DIAGNOSIS — S42252A Displaced fracture of greater tuberosity of left humerus, initial encounter for closed fracture: Secondary | ICD-10-CM | POA: Diagnosis not present

## 2018-06-28 DIAGNOSIS — M25512 Pain in left shoulder: Secondary | ICD-10-CM | POA: Diagnosis not present

## 2018-06-28 DIAGNOSIS — M25612 Stiffness of left shoulder, not elsewhere classified: Secondary | ICD-10-CM | POA: Diagnosis not present

## 2018-06-28 DIAGNOSIS — M6281 Muscle weakness (generalized): Secondary | ICD-10-CM | POA: Diagnosis not present

## 2018-06-30 DIAGNOSIS — S42252A Displaced fracture of greater tuberosity of left humerus, initial encounter for closed fracture: Secondary | ICD-10-CM | POA: Diagnosis not present

## 2018-07-04 DIAGNOSIS — S42252A Displaced fracture of greater tuberosity of left humerus, initial encounter for closed fracture: Secondary | ICD-10-CM | POA: Diagnosis not present

## 2018-07-06 ENCOUNTER — Ambulatory Visit (INDEPENDENT_AMBULATORY_CARE_PROVIDER_SITE_OTHER): Payer: PPO | Admitting: Internal Medicine

## 2018-07-06 DIAGNOSIS — M8580 Other specified disorders of bone density and structure, unspecified site: Secondary | ICD-10-CM | POA: Diagnosis not present

## 2018-07-06 DIAGNOSIS — Z0000 Encounter for general adult medical examination without abnormal findings: Secondary | ICD-10-CM

## 2018-07-06 DIAGNOSIS — M25512 Pain in left shoulder: Secondary | ICD-10-CM

## 2018-07-06 DIAGNOSIS — E039 Hypothyroidism, unspecified: Secondary | ICD-10-CM

## 2018-07-06 DIAGNOSIS — M6281 Muscle weakness (generalized): Secondary | ICD-10-CM | POA: Diagnosis not present

## 2018-07-06 DIAGNOSIS — F419 Anxiety disorder, unspecified: Secondary | ICD-10-CM | POA: Diagnosis not present

## 2018-07-06 DIAGNOSIS — R319 Hematuria, unspecified: Secondary | ICD-10-CM

## 2018-07-06 DIAGNOSIS — K862 Cyst of pancreas: Secondary | ICD-10-CM

## 2018-07-06 DIAGNOSIS — M25612 Stiffness of left shoulder, not elsewhere classified: Secondary | ICD-10-CM | POA: Diagnosis not present

## 2018-07-06 DIAGNOSIS — S42252A Displaced fracture of greater tuberosity of left humerus, initial encounter for closed fracture: Secondary | ICD-10-CM | POA: Diagnosis not present

## 2018-07-06 NOTE — Assessment & Plan Note (Addendum)
Did no do physical today.  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  Recommended f/u in 3 years.  Mammogram - states had through [place] this year. Obtain results.

## 2018-07-06 NOTE — Progress Notes (Signed)
Patient ID: BENNETT VANSCYOC, female   DOB: 04-Feb-1941, 77 y.o.   MRN: [id number]   Subjective:    Patient ID: WINIFRED BODIFORD, female    DOB: Dec 04, 1940, 77 y.o.   MRN: [id number]  HPI  Patient was scheduled for a physical exam.  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  Declines physical here today.  Here for f/u.  States she is doing relatively well.  Is s/p left shoulder fracture.  Going to therapy.  Followed by ortho.  Still with some discomfort.  Tries to stay active.  Discussed walking exercise.  No chest pain. No sob.  No acid reflux.  No abdominal pain.  Bowels moving.     Past Medical History:  Diagnosis Date  . Colon polyp   . Hyperthyroidism    Past Surgical History:  Procedure Laterality Date  . APPENDECTOMY     child  . TONSILECTOMY, ADENOIDECTOMY, BILATERAL MYRINGOTOMY AND TUBES     child   Family History  Problem Relation Age of Onset  . Breast cancer Mother    Social History   Socioeconomic History  . Marital status: Married    Spouse name: Not on file  . Number of children: 3  . Years of education: Not on file  . Highest education level: Not on file  Occupational History  . Occupation: retired  Scientific laboratory technician  . Financial resource strain: Not hard at all  . Food insecurity:    Worry: Never true    Inability: Never true  . Transportation needs:    Medical: No    Non-medical: No  Tobacco Use  . Smoking status: Never Smoker  . Smokeless tobacco: Never Used  Substance and Sexual Activity  . Alcohol use: Yes    Comment: socially  . Drug use: No  . Sexual activity: Yes  Lifestyle  . Physical activity:    Days per week: 7 days    Minutes per session: 60 min  . Stress: Not at all  Relationships  . Social connections:    Talks on phone: Not on file    Gets together: Not on file    Attends religious service: Not on file    Active member of club or organization: Not on file    Attends meetings of clubs or organizations: Not on file    Relationship  status: Not on file  Other Topics Concern  . Not on file  Social History Narrative  . Not on file    Outpatient Encounter Medications as of 07/06/2018  Medication Sig  . Biotin 5 MG CAPS Take by mouth.  . Calcium Carbonate-Simethicone 1000-60 MG CHEW Chew by mouth.  . Calcium-Phosphorus-Vitamin D (CITRACAL +D3 PO) Take 2 tablets by mouth daily.  . Glucosamine HCl-MSM (SM GLUCOSAMINE HCL-MSM) 750-750 MG TABS Take by mouth.  . levothyroxine (SYNTHROID, LEVOTHROID) 75 MCG tablet TAKE 1 TABLET BY MOUTH DAILY BEFORE BREAKFAST  . Multiple Vitamins-Minerals (CENTRUM SILVER 50+WOMEN) TABS Take 1 tablet by mouth daily.  . mupirocin ointment (BACTROBAN) 2 % Apply to affected area bid  . Omega-3 Fatty Acids (OMEGA-3 FISH OIL) 1200 MG CAPS Take by mouth.  . Probiotic Product (PROBIOTIC-10) CAPS Take by mouth.  Marland Kitchen XIIDRA 5 % SOLN Place 1 drop into both eyes as needed.   No facility-administered encounter medications on file as of 07/06/2018.     Review of Systems  Constitutional: Negative for appetite change and unexpected weight change.  HENT: Negative for congestion and sinus pressure.  Respiratory: Negative for cough, chest tightness and shortness of breath.   Cardiovascular: Negative for chest pain, palpitations and leg swelling.  Gastrointestinal: Negative for abdominal pain, diarrhea, nausea and vomiting.  Genitourinary: Negative for difficulty urinating and dysuria.  Musculoskeletal: Negative for joint swelling and myalgias.  Neurological: Negative for dizziness, light-headedness and headaches.  Psychiatric/Behavioral: Negative for dysphoric mood.       Increased stress and anxiety.  Discussed with her today.  Does not feel needs any further intervention.         Objective:    Physical Exam  Constitutional: She appears well-developed and well-nourished. No distress.  HENT:  Nose: Nose normal.  Mouth/Throat: Oropharynx is clear and moist.  Neck: Neck supple. No thyromegaly present.   Cardiovascular: Normal rate and regular rhythm.  Pulmonary/Chest: Breath sounds normal. No respiratory distress. She has no wheezes.  Abdominal: Soft. Bowel sounds are normal. There is no tenderness.  Musculoskeletal: She exhibits no edema or tenderness.  Lymphadenopathy:    She has no cervical adenopathy.  Skin: No rash noted. No erythema.  Psychiatric: She has a normal mood and affect. Her behavior is normal.    BP 130/78 (BP Location: Left Arm, Patient Position: Sitting, Cuff Size: Normal)   Pulse 72   Temp 97.6 F (36.4 C) (Oral)   Resp 18   Ht 5\' 2"  (1.575 m)   Wt 120 lb 12.8 oz (54.8 kg)   SpO2 96%   BMI 22.09 kg/m  Wt Readings from Last 3 Encounters:  07/06/18 120 lb 12.8 oz (54.8 kg)  05/04/18 120 lb 1.9 oz (54.5 kg)  03/08/18 121 lb 6.4 oz (55.1 kg)     Lab Results  Component Value Date   WBC 4.4 01/12/2017   HGB 14.3 01/12/2017   HCT 41.8 01/12/2017   PLT 234.0 01/12/2017   GLUCOSE 94 01/12/2017   CHOL 218 (H) 01/12/2017   TRIG 67.0 01/12/2017   HDL 86.20 01/12/2017   LDLCALC 118 (H) 01/12/2017   ALT 16 01/12/2017   AST 22 01/12/2017   NA 142 01/12/2017   K 4.0 01/12/2017   CL 105 01/12/2017   CREATININE 0.83 01/12/2017   BUN 20 01/12/2017   CO2 31 01/12/2017   TSH 3.13 11/17/2017   INR 0.8 02/29/2012    Mr Shoulder Left Wo Contrast  Result Date: 03/31/2018 CLINICAL DATA:  Left shoulder pain. EXAM: MRI OF THE LEFT SHOULDER WITHOUT CONTRAST TECHNIQUE: Multiplanar, multisequence MR imaging of the shoulder was performed. No intravenous contrast was administered. COMPARISON:  None. FINDINGS: Rotator cuff: Mild tendinosis of the supraspinatus tendon with a small partial-thickness articular surface tear. Mild tendinosis of the infraspinatus tendon with fraying along the bursal surface. Teres minor tendon is intact. Subscapularis tendon is intact. Muscles: No atrophy or fatty replacement of nor abnormal signal within, the muscles of the rotator cuff. Biceps  long head:  Intact. Acromioclavicular Joint: Mild arthropathy of the acromioclavicular joint. Type I acromion. Small amount of subacromial/subdeltoid bursal fluid. Glenohumeral Joint: Large joint effusion.  No chondral defect. Labrum: Prominent superior labral sulcus. Small posterior labral tear. Bones: Nondisplaced fracture of the greater tuberosity with severe marrow edema. No other fracture or dislocation. No aggressive osseous lesion. Other: No fluid collection or hematoma. IMPRESSION: 1. Nondisplaced fracture of left greater tuberosity with surrounding marrow edema. 2. Mild tendinosis of the supraspinatus tendon with a small partial-thickness articular surface tear. Mild tendinosis of the infraspinatus tendon with fraying along the bursal surface. Electronically Signed   By: Elbert Ewings  Patel   On: 03/31/2018 10:59       Assessment & Plan:   Problem List Items Addressed This Visit    Acquired hypothyroidism    On thyroid replacement.  Follow tsh.        Anxiety    Discussed with her today.  Does not feel needs any further intervention.  Follow.        Cyst of pancreas    Had MRI/MRCP 01/2017.  Recommended f/u in 18 months.  Has f/u scheduled.        Healthcare maintenance    Did no do physical today.  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  Recommended f/u in 3 years.  Mammogram - states had through [facility] this year. Obtain results.        Hematuria    Discussed again with her today.  Have discussed referral to urology. She continues to decline.        Left shoulder pain    S/p fracture.  Has seen ortho.  Going to therapy.        Osteopenia    Bone density as outlined.  Recommended f/u in 2020.            Einar Pheasant, MD

## 2018-07-09 ENCOUNTER — Encounter: Payer: Self-pay | Admitting: Internal Medicine

## 2018-07-09 NOTE — Assessment & Plan Note (Signed)
Discussed with her today.  Does not feel needs any further intervention.  Follow.

## 2018-07-09 NOTE — Assessment & Plan Note (Signed)
Had MRI/MRCP 01/2017.  Recommended f/u in 18 months.  Has f/u scheduled.

## 2018-07-09 NOTE — Assessment & Plan Note (Signed)
On thyroid replacement.  Follow tsh.  

## 2018-07-09 NOTE — Assessment & Plan Note (Signed)
Bone density as outlined.  Recommended f/u in 2020.

## 2018-07-09 NOTE — Assessment & Plan Note (Signed)
S/p fracture.  Has seen ortho.  Going to therapy.

## 2018-07-09 NOTE — Assessment & Plan Note (Signed)
Discussed again with her today.  Have discussed referral to urology. She continues to decline.

## 2018-07-12 ENCOUNTER — Ambulatory Visit (INDEPENDENT_AMBULATORY_CARE_PROVIDER_SITE_OTHER): Payer: Self-pay | Admitting: Plastic Surgery

## 2018-07-12 ENCOUNTER — Encounter: Payer: Self-pay | Admitting: Plastic Surgery

## 2018-07-12 VITALS — BP 127/78 | HR 90 | Resp 12 | Ht 63.0 in | Wt 120.0 lb

## 2018-07-12 DIAGNOSIS — M25512 Pain in left shoulder: Secondary | ICD-10-CM | POA: Diagnosis not present

## 2018-07-12 DIAGNOSIS — M6281 Muscle weakness (generalized): Secondary | ICD-10-CM | POA: Diagnosis not present

## 2018-07-12 DIAGNOSIS — Z719 Counseling, unspecified: Secondary | ICD-10-CM

## 2018-07-12 DIAGNOSIS — M25612 Stiffness of left shoulder, not elsewhere classified: Secondary | ICD-10-CM | POA: Diagnosis not present

## 2018-07-12 DIAGNOSIS — S42252A Displaced fracture of greater tuberosity of left humerus, initial encounter for closed fracture: Secondary | ICD-10-CM | POA: Diagnosis not present

## 2018-07-12 DIAGNOSIS — G8929 Other chronic pain: Secondary | ICD-10-CM

## 2018-07-12 NOTE — Progress Notes (Signed)
Patient complained of left shoulder pain.  She stated the voltaran cream was very helpful.  She requested a refill.  This was called into [facility].

## 2018-07-12 NOTE — Progress Notes (Deleted)
  Subjective:     Patient ID: Sherry Bernard, female   DOB: [birth year], 77 y.o.   MRN: [id number]  HPI   Review of Systems     Objective:   Physical Exam     Assessment:     ***    Plan:     ***

## 2018-07-12 NOTE — Progress Notes (Signed)
Patient ID: Sherry Bernard, female   DOB: May 17, 1941, 77 y.o.   MRN: [id number]  Botulinum Toxin Procedure Note  Procedure: Cosmetic botulinum toxin and Filler administration  Pre-operative Diagnosis: Dynamic rhytides   Post-operative Diagnosis: Same  Complications:  None  Brief history: The patient desires botulinum toxin injection of her forehead. I discussed with the patient this proposed procedure of botulinum toxin injections, which is customized depending on the particular needs of the patient. It is performed on facial rhytids as a temporary correction. The alternatives were discussed with the patient. The risks were addressed including bleeding, scarring, infection, damage to deeper structures, asymmetry, and chronic pain, which may occur infrequently after a procedure. The individual's choice to undergo a surgical procedure is based on the comparison of risks to potential benefits. Other risks include unsatisfactory results, brow ptosis, eyelid ptosis, allergic reaction, temporary paralysis, which should go away with time, bruising, blurring disturbances and delayed healing. Botulinum toxin injections do not arrest the aging process or produce permanent tightening of the eyelid.  Operative intervention maybe necessary to maintain the results of a blepharoplasty or botulinum toxin. The patient understands and wishes to proceed. An informed consent was signed and informational brochures given to her prior to the procedure.  Procedure: The area was prepped with alcohol and dried with a clean gauze. Using a clean technique, the botulinum toxin was diluted with 1.25 cc of preservative-free normal saline which was slowly injected with an 18 gauge needle in a tuberculin syringes.  A 32 gauge needles were then used to inject the botulinum toxin. This mixture allow for an aliquot of 5 units per 0.1 cc in each injection site.    Subsequently the mixture was injected in the glabellar and forehead area  with preservation of the temporal branch to the lateral eyebrow as well as into each lateral canthal area beginning from the lateral orbital rim medial to the zygomaticus major in 3 separate areas. A total of 20 Units of botulinum toxin was used. The forehead and glabellar area was injected with care to inject intramuscular only while holding pressure on the supratrochlear vessels in each area during each injection on either side of the medial corrugators. The injection proceeded vertically superiorly to the medial 2/3 of the frontalis muscle and superior 2/3 of the lateral frontalis, again with preservation of the frontal branch.  No complications were noted. Light pressure was held for 5 minutes. She was instructed explicitly in post-operative care.  Botox LOT:  [id number] C2 EXP:  12/2018

## 2018-07-14 ENCOUNTER — Encounter: Payer: Self-pay | Admitting: Internal Medicine

## 2018-07-14 DIAGNOSIS — M6281 Muscle weakness (generalized): Secondary | ICD-10-CM | POA: Diagnosis not present

## 2018-07-14 DIAGNOSIS — M25512 Pain in left shoulder: Secondary | ICD-10-CM | POA: Diagnosis not present

## 2018-07-14 DIAGNOSIS — M25612 Stiffness of left shoulder, not elsewhere classified: Secondary | ICD-10-CM | POA: Diagnosis not present

## 2018-07-14 DIAGNOSIS — S42252A Displaced fracture of greater tuberosity of left humerus, initial encounter for closed fracture: Secondary | ICD-10-CM | POA: Diagnosis not present

## 2018-07-18 DIAGNOSIS — M25612 Stiffness of left shoulder, not elsewhere classified: Secondary | ICD-10-CM | POA: Diagnosis not present

## 2018-07-18 DIAGNOSIS — M25512 Pain in left shoulder: Secondary | ICD-10-CM | POA: Diagnosis not present

## 2018-07-18 DIAGNOSIS — M6281 Muscle weakness (generalized): Secondary | ICD-10-CM | POA: Diagnosis not present

## 2018-07-18 DIAGNOSIS — S42252A Displaced fracture of greater tuberosity of left humerus, initial encounter for closed fracture: Secondary | ICD-10-CM | POA: Diagnosis not present

## 2018-07-22 ENCOUNTER — Encounter: Payer: Self-pay | Admitting: Plastic Surgery

## 2018-07-22 ENCOUNTER — Ambulatory Visit (INDEPENDENT_AMBULATORY_CARE_PROVIDER_SITE_OTHER): Payer: Self-pay | Admitting: Plastic Surgery

## 2018-07-22 DIAGNOSIS — M25512 Pain in left shoulder: Secondary | ICD-10-CM | POA: Diagnosis not present

## 2018-07-22 DIAGNOSIS — Z719 Counseling, unspecified: Secondary | ICD-10-CM

## 2018-07-22 DIAGNOSIS — M25612 Stiffness of left shoulder, not elsewhere classified: Secondary | ICD-10-CM | POA: Diagnosis not present

## 2018-07-22 DIAGNOSIS — S42252A Displaced fracture of greater tuberosity of left humerus, initial encounter for closed fracture: Secondary | ICD-10-CM | POA: Diagnosis not present

## 2018-07-22 DIAGNOSIS — M6281 Muscle weakness (generalized): Secondary | ICD-10-CM | POA: Diagnosis not present

## 2018-07-22 NOTE — Progress Notes (Signed)
Filler Injection Procedure Note  Procedure:  Filler administration  Pre-operative Diagnosis:  midface volume loss  Post-operative Diagnosis: Same  Complications:  None  Brief history: The patient desires injection with fillers in her face. I discussed with the patient this proposed procedure of filler injections, which is customized depending on the particular needs of the patient. It is performed on facial volume loss as a temporary correction. The alternatives were discussed with the patient. The risks were addressed including bleeding, scarring, infection, damage to deeper structures, asymmetry, and chronic pain, which may occur infrequently after a procedure. The individual's choice to undergo a surgical procedure is based on the comparison of risks to potential benefits. Other risks include unsatisfactory results, allergic reaction, which should go away with time, bruising and delayed healing. Fillers do not arrest the aging process or produce permanent tightening.  Operative intervention maybe necessary to maintain the results. The patient understands and wishes to proceed. An informed consent was signed and informational brochures given to her prior to the procedure.  Procedure: The area was prepped with alcohol and dried with a clean gauze. Using a clean technique, a 30 gauge needle was then used to inject the filler into the midface area at the medial sub-region of the mid-face on the left.  No complications were noted. Light pressure was held for 5 minutes. She was instructed explicitly in post-operative care.  This improved her symmetry and she was very happy.  LOT: [id number] EXP: 2019-10-31

## 2018-07-25 DIAGNOSIS — M25512 Pain in left shoulder: Secondary | ICD-10-CM | POA: Diagnosis not present

## 2018-07-25 DIAGNOSIS — S42252A Displaced fracture of greater tuberosity of left humerus, initial encounter for closed fracture: Secondary | ICD-10-CM | POA: Diagnosis not present

## 2018-07-25 DIAGNOSIS — M6281 Muscle weakness (generalized): Secondary | ICD-10-CM | POA: Diagnosis not present

## 2018-07-25 DIAGNOSIS — M25612 Stiffness of left shoulder, not elsewhere classified: Secondary | ICD-10-CM | POA: Diagnosis not present

## 2018-07-26 ENCOUNTER — Ambulatory Visit
Admission: RE | Admit: 2018-07-26 | Discharge: 2018-07-26 | Disposition: A | Discharge status: Home / Self Care | Payer: PPO | Source: Ambulatory Visit | Attending: Gastroenterology | Admitting: Gastroenterology

## 2018-07-26 ENCOUNTER — Ambulatory Visit: Payer: PPO

## 2018-07-26 DIAGNOSIS — K8689 Other specified diseases of pancreas: Secondary | ICD-10-CM | POA: Diagnosis not present

## 2018-07-26 DIAGNOSIS — D1809 Hemangioma of other sites: Secondary | ICD-10-CM | POA: Diagnosis not present

## 2018-07-26 DIAGNOSIS — D1803 Hemangioma of intra-abdominal structures: Secondary | ICD-10-CM | POA: Diagnosis not present

## 2018-07-26 DIAGNOSIS — K862 Cyst of pancreas: Secondary | ICD-10-CM

## 2018-07-26 DIAGNOSIS — R935 Abnormal findings on diagnostic imaging of other abdominal regions, including retroperitoneum: Secondary | ICD-10-CM | POA: Diagnosis not present

## 2018-07-26 LAB — POCT I-STAT CREATININE: CREATININE: 0.7 mg/dL (ref 0.44–1.00)

## 2018-07-26 MED ORDER — GADOBUTROL 1 MMOL/ML IV SOLN
5.5000 mL | Freq: Once | INTRAVENOUS | Status: AC | PRN
  Administered 2018-07-26: 5.5 mL via INTRAVENOUS

## 2018-07-27 DIAGNOSIS — S42252D Displaced fracture of greater tuberosity of left humerus, subsequent encounter for fracture with routine healing: Secondary | ICD-10-CM | POA: Diagnosis not present

## 2018-07-27 DIAGNOSIS — M25612 Stiffness of left shoulder, not elsewhere classified: Secondary | ICD-10-CM | POA: Diagnosis not present

## 2018-07-27 DIAGNOSIS — M25512 Pain in left shoulder: Secondary | ICD-10-CM | POA: Diagnosis not present

## 2018-07-27 DIAGNOSIS — S42252A Displaced fracture of greater tuberosity of left humerus, initial encounter for closed fracture: Secondary | ICD-10-CM | POA: Diagnosis not present

## 2018-08-01 DIAGNOSIS — M25512 Pain in left shoulder: Secondary | ICD-10-CM | POA: Diagnosis not present

## 2018-08-01 DIAGNOSIS — M6281 Muscle weakness (generalized): Secondary | ICD-10-CM | POA: Diagnosis not present

## 2018-08-01 DIAGNOSIS — M25612 Stiffness of left shoulder, not elsewhere classified: Secondary | ICD-10-CM | POA: Diagnosis not present

## 2018-08-01 DIAGNOSIS — S42252A Displaced fracture of greater tuberosity of left humerus, initial encounter for closed fracture: Secondary | ICD-10-CM | POA: Diagnosis not present

## 2018-08-02 DIAGNOSIS — Z131 Encounter for screening for diabetes mellitus: Secondary | ICD-10-CM | POA: Diagnosis not present

## 2018-08-02 DIAGNOSIS — M8580 Other specified disorders of bone density and structure, unspecified site: Secondary | ICD-10-CM | POA: Diagnosis not present

## 2018-08-02 DIAGNOSIS — Z780 Asymptomatic menopausal state: Secondary | ICD-10-CM | POA: Diagnosis not present

## 2018-08-02 DIAGNOSIS — Z13220 Encounter for screening for lipoid disorders: Secondary | ICD-10-CM | POA: Diagnosis not present

## 2018-08-02 DIAGNOSIS — N6019 Diffuse cystic mastopathy of unspecified breast: Secondary | ICD-10-CM | POA: Diagnosis not present

## 2018-08-02 DIAGNOSIS — K862 Cyst of pancreas: Secondary | ICD-10-CM | POA: Diagnosis not present

## 2018-08-02 DIAGNOSIS — J984 Other disorders of lung: Secondary | ICD-10-CM | POA: Diagnosis not present

## 2018-08-02 DIAGNOSIS — R918 Other nonspecific abnormal finding of lung field: Secondary | ICD-10-CM | POA: Diagnosis not present

## 2018-08-02 DIAGNOSIS — D126 Benign neoplasm of colon, unspecified: Secondary | ICD-10-CM | POA: Diagnosis not present

## 2018-08-02 DIAGNOSIS — E039 Hypothyroidism, unspecified: Secondary | ICD-10-CM | POA: Diagnosis not present

## 2018-08-02 DIAGNOSIS — Z136 Encounter for screening for cardiovascular disorders: Secondary | ICD-10-CM | POA: Diagnosis not present

## 2018-08-02 DIAGNOSIS — Z0000 Encounter for general adult medical examination without abnormal findings: Secondary | ICD-10-CM | POA: Diagnosis not present

## 2018-08-03 DIAGNOSIS — S42252A Displaced fracture of greater tuberosity of left humerus, initial encounter for closed fracture: Secondary | ICD-10-CM | POA: Diagnosis not present

## 2018-08-03 DIAGNOSIS — M25512 Pain in left shoulder: Secondary | ICD-10-CM | POA: Diagnosis not present

## 2018-08-03 DIAGNOSIS — M25612 Stiffness of left shoulder, not elsewhere classified: Secondary | ICD-10-CM | POA: Diagnosis not present

## 2018-08-03 DIAGNOSIS — M6281 Muscle weakness (generalized): Secondary | ICD-10-CM | POA: Diagnosis not present

## 2018-08-10 DIAGNOSIS — K862 Cyst of pancreas: Secondary | ICD-10-CM | POA: Diagnosis not present

## 2018-10-14 ENCOUNTER — Other Ambulatory Visit: Payer: Self-pay | Admitting: Internal Medicine

## 2018-10-18 ENCOUNTER — Telehealth: Payer: Self-pay | Admitting: Internal Medicine

## 2018-10-18 ENCOUNTER — Ambulatory Visit (INDEPENDENT_AMBULATORY_CARE_PROVIDER_SITE_OTHER): Payer: Self-pay | Admitting: Plastic Surgery

## 2018-10-18 ENCOUNTER — Encounter: Payer: Self-pay | Admitting: Plastic Surgery

## 2018-10-18 VITALS — BP 136/85 | HR 65 | Temp 97.6°F | Ht 63.0 in | Wt 122.0 lb

## 2018-10-18 DIAGNOSIS — Z719 Counseling, unspecified: Secondary | ICD-10-CM

## 2018-10-18 NOTE — Progress Notes (Signed)
Botulinum Toxin Procedure Note  Procedure: Cosmetic botulinum toxin   Pre-operative Diagnosis: Dynamic rhytides   Post-operative Diagnosis: Same  Complications:  None  Brief history: The patient desires botulinum toxin injection of her forehead. I discussed with the patient this proposed procedure of botulinum toxin injections, which is customized depending on the particular needs of the patient. It is performed on facial rhytids as a temporary correction. The alternatives were discussed with the patient. The risks were addressed including bleeding, scarring, infection, damage to deeper structures, asymmetry, and chronic pain, which may occur infrequently after a procedure. The individual's choice to undergo a surgical procedure is based on the comparison of risks to potential benefits. Other risks include unsatisfactory results, brow ptosis, eyelid ptosis, allergic reaction, temporary paralysis, which should go away with time, bruising, blurring disturbances and delayed healing. Botulinum toxin injections do not arrest the aging process or produce permanent tightening of the eyelid.  Operative intervention maybe necessary to maintain the results of a blepharoplasty or botulinum toxin. The patient understands and wishes to proceed. An informed consent was signed and informational brochures given to her prior to the procedure.  Procedure: The area was prepped with alcohol and dried with a clean gauze. Using a clean technique, the botulinum toxin was diluted with 1.25 cc of preservative-free normal saline which was slowly injected with an 18 gauge needle in a tuberculin syringes.  A 32 gauge needles were then used to inject the botulinum toxin. This mixture allow for an aliquot of 5 units per 0.1 cc in each injection site.    Subsequently the mixture was injected in the glabellar and forehead area with preservation of the temporal branch to the lateral eyebrow as well as into each lateral canthal area  beginning from the lateral orbital rim medial to the zygomaticus major in 3 separate areas. A total of 30 Units of botulinum toxin was used. The forehead and glabellar area was injected with care to inject intramuscular only while holding pressure on the supratrochlear vessels in each area during each injection on either side of the medial corrugators. The injection proceeded vertically superiorly to the medial 2/3 of the frontalis muscle and superior 2/3 of the lateral frontalis, again with preservation of the frontal branch. No complications were noted. Light pressure was held for 5 minutes. She was instructed explicitly in post-operative care.  Botox LOT:  [id number] EXP:  05/22

## 2018-10-18 NOTE — Telephone Encounter (Signed)
Pt is needing a refill on Tri-Luma Cream sent to [facility]

## 2018-10-18 NOTE — Telephone Encounter (Signed)
Patient stating that she got her nails done about 2 months ago. Her middle finger on her right hand is red around the nail bed area and sore. No swelling, no drainage. Patient has been using neosporin thinking that it would get better but it has not. Scheduled appt with Ander Purpura so she can have someone look at it and determine what treatment is needed.

## 2018-10-20 ENCOUNTER — Encounter: Payer: Self-pay | Admitting: Family Medicine

## 2018-10-20 ENCOUNTER — Ambulatory Visit (INDEPENDENT_AMBULATORY_CARE_PROVIDER_SITE_OTHER): Payer: PPO | Admitting: Family Medicine

## 2018-10-20 VITALS — BP 122/76 | HR 66 | Temp 97.5°F | Resp 16 | Ht 63.0 in | Wt 125.4 lb

## 2018-10-20 DIAGNOSIS — L089 Local infection of the skin and subcutaneous tissue, unspecified: Secondary | ICD-10-CM

## 2018-10-20 MED ORDER — CEPHALEXIN 500 MG PO CAPS: 500.0000 mg | ORAL_CAPSULE | Freq: Two times a day (BID) | ORAL | 0 refills | Status: AC

## 2018-10-20 MED ORDER — MUPIROCIN 2 % EX OINT: TOPICAL_OINTMENT | CUTANEOUS | 0 refills | Status: DC

## 2018-10-20 NOTE — Progress Notes (Signed)
Subjective:    Patient ID: Sherry Bernard, female    DOB: 12-22-1940, 78 y.o.   MRN: [id number]  HPI   Patient presents to clinic due to infection of skin on right middle finger.  Patient goes to a nail salon, believes they cut her cuticles too deep.  She has been trying to put some Neosporin type ointment on the area, but redness and soreness continues to persist.  Denies fever or chills.  Denies any change in range of motion ability of hand/fingers.  Denies any drainage from area.  Patient Active Problem List   Diagnosis Date Noted  . Left shoulder pain 03/12/2018  . SOB (shortness of breath) on exertion 03/12/2018  . Anxiety 08/18/2017  . Hematuria 08/16/2017  . Low back pain 01/10/2017  . Encounter for counseling 11/06/2016  . Cough 10/06/2016  . Diarrhea 10/06/2016  . Fibrocystic breast disease 05/05/2016  . Osteopenia 05/05/2016  . Cyst of pancreas 08/13/2015  . 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 Acquired hypothyroidism 08/02/2014  . Rhytides 11/20/2013  . Postmenopausal 10/13/1991   Social History   Tobacco Use  . Smoking status: Never Smoker  . Smokeless tobacco: Never Used  Substance Use Topics  . Alcohol use: Yes    Comment: socially   Review of Systems   Constitutional: Negative for chills, fatigue and fever.  HENT: Negative for congestion, ear pain, sinus pain and sore throat.   Eyes: Negative.   Respiratory: Negative for cough, shortness of breath and wheezing.   Cardiovascular: Negative for chest pain, palpitations and leg swelling.  Gastrointestinal: Negative for abdominal pain, diarrhea, nausea and vomiting.  Genitourinary: Negative for dysuria, frequency and urgency.  Musculoskeletal: Negative for arthralgias and myalgias.  Skin: ?infection right middle finger Neurological: Negative for syncope, light-headedness and headaches.  Psychiatric/Behavioral: The patient is not nervous/anxious.    Objective:   Physical Exam Vitals signs and nursing  note reviewed.  Constitutional:      Appearance: Normal appearance.  HENT:     Head: Normocephalic and atraumatic.  Eyes:     General: No scleral icterus.    Extraocular Movements: Extraocular movements intact.     Conjunctiva/sclera: Conjunctivae normal.  Cardiovascular:     Rate and Rhythm: Normal rate and regular rhythm.     Comments: +radial and ulnar pulses normal.  Pulmonary:     Effort: Pulmonary effort is normal. No respiratory distress.     Breath sounds: Normal breath sounds.  Musculoskeletal:       Hands:     Comments: Area of redness and tenderness indicated by red mark on diagram of hand.  It does appear that cuticle was cut too deep, which cause infection of skin.  Skin:    General: Skin is warm and dry.     Coloration: Skin is not pale.     Findings: Erythema present.  Neurological:     Mental Status: She is alert and oriented to person, place, and time.  Psychiatric:        Mood and Affect: Mood normal.        Behavior: Behavior normal.    Vitals:   10/20/18 1331  BP: 122/76  Pulse: 66  Resp: 16  Temp: (!) 97.5 F (36.4 C)  SpO2: 98%      Assessment & Plan:   Infection of skin of finger - patient's at home attempt using Neosporin to help improve skin infection was unsuccessful.  We will treat with course of Keflex twice daily  for 10 days and patient will use topical mupirocin ointment on area.  Advised to monitor area for any worsening redness or pain, if area does not improve patient is aware to call office right away and let us know.  Offered follow-up appointment in 1 week for recheck, but she declines and states she will let us know if it is not improving.  Otherwise she will keep regularly scheduled follow-up with PCP as planned.

## 2018-11-16 DIAGNOSIS — Z1231 Encounter for screening mammogram for malignant neoplasm of breast: Secondary | ICD-10-CM | POA: Diagnosis not present

## 2018-11-16 DIAGNOSIS — Z803 Family history of malignant neoplasm of breast: Secondary | ICD-10-CM | POA: Diagnosis not present

## 2018-11-16 LAB — HM MAMMOGRAPHY

## 2018-12-05 ENCOUNTER — Encounter: Payer: Self-pay | Admitting: Internal Medicine

## 2018-12-05 DIAGNOSIS — Z0000 Encounter for general adult medical examination without abnormal findings: Secondary | ICD-10-CM | POA: Insufficient documentation

## 2018-12-15 DIAGNOSIS — H40013 Open angle with borderline findings, low risk, bilateral: Secondary | ICD-10-CM | POA: Diagnosis not present

## 2018-12-16 ENCOUNTER — Ambulatory Visit: Payer: Self-pay | Admitting: *Deleted

## 2018-12-16 NOTE — Telephone Encounter (Signed)
Are you okay with her trying OTC products and advise evaluation if worsens?

## 2018-12-16 NOTE — Telephone Encounter (Signed)
Reviewed message.  Symptoms just started within the last 24 hours.  Some hoarseness and cough.  Confirm no other symptoms.  Confirm no sob.  No fever.  If drainage, then can use nasacort nasal spray 2 sprays each nostril one time per day.  Do this in the evening.  Robitussin DM twice a day as needed for cough and congestion.  If persistent problems, will need to be evaluated.

## 2018-12-16 NOTE — Telephone Encounter (Signed)
Patient is aware and confirmed no fever, no sob, no other acute symptoms. Advised pt of message below and pt agreed to let us know if symptoms do not improve

## 2018-12-16 NOTE — Telephone Encounter (Signed)
Message from Keene Breath sent at 12/16/2018 8:01 AM EST   Patient stated that she woke up this morning with hoarse throat and cough. She did not want to make an appointment and wanted to ask what could she take OTC. Please advise and call patient back at [phone]   Called patient back regarding a cough that the patient feels like developed over night. She wants ot be pro active. So she is calling for advice on what she should do for cough.   She denies fever, or shortness of breath. No chest pain or fullness or allergies. Home care advice given with verbal understanding. To try cough drops or hard candy, humidifier, increase fluids, hot water with honey, hot tea with lemon and sugar.  She will call back for fever or worsening symptoms but will try home care advice first. Routing to flow at [facility].  Reason for Disposition . Cough  Answer Assessment - Initial Assessment Questions 1. ONSET: "When did the cough begin?"      Last night 2. SEVERITY: "How bad is the cough today?"      Not bad but thinks it may be the beginning of something 3. RESPIRATORY DISTRESS: "Describe your breathing."      normal 4. FEVER: "Do you have a fever?" If so, ask: "What is your temperature, how was it measured, and when did it start?"     no 5. HEMOPTYSIS: "Are you coughing up any blood?" If so ask: "How much?" (flecks, streaks, tablespoons, etc.)     No sputum 6. TREATMENT: "What have you done so far to treat the cough?" (e.g., meds, fluids, humidifier)     no 7. CARDIAC HISTORY: "Do you have any history of heart disease?" (e.g., heart attack, congestive heart failure)      no 8. LUNG HISTORY: "Do you have any history of lung disease?"  (e.g., pulmonary embolus, asthma, emphysema)     no 9. PE RISK FACTORS: "Do you have a history of blood clots?" (or: recent major surgery, recent prolonged travel, bedridden)     no 10. OTHER SYMPTOMS: "Do you have any other symptoms? (e.g., runny nose,  wheezing, chest pain)       no 11. PREGNANCY: "Is there any chance you are pregnant?" "When was your last menstrual period?"       no 12. TRAVEL: "Have you traveled out of the country in the last month?" (e.g., travel history, exposures)       no  Protocols used: COUGH - ACUTE NON-PRODUCTIVE-A-AH

## 2018-12-17 DIAGNOSIS — J069 Acute upper respiratory infection, unspecified: Secondary | ICD-10-CM | POA: Diagnosis not present

## 2018-12-24 DIAGNOSIS — J019 Acute sinusitis, unspecified: Secondary | ICD-10-CM | POA: Diagnosis not present

## 2018-12-24 DIAGNOSIS — R05 Cough: Secondary | ICD-10-CM | POA: Diagnosis not present

## 2018-12-24 DIAGNOSIS — J069 Acute upper respiratory infection, unspecified: Secondary | ICD-10-CM | POA: Diagnosis not present

## 2019-01-04 ENCOUNTER — Ambulatory Visit (INDEPENDENT_AMBULATORY_CARE_PROVIDER_SITE_OTHER): Payer: PPO | Admitting: Internal Medicine

## 2019-01-04 ENCOUNTER — Encounter: Payer: Self-pay | Admitting: Internal Medicine

## 2019-01-04 ENCOUNTER — Other Ambulatory Visit: Payer: Self-pay

## 2019-01-04 DIAGNOSIS — Z13220 Encounter for screening for lipoid disorders: Secondary | ICD-10-CM | POA: Diagnosis not present

## 2019-01-04 DIAGNOSIS — F419 Anxiety disorder, unspecified: Secondary | ICD-10-CM

## 2019-01-04 DIAGNOSIS — K862 Cyst of pancreas: Secondary | ICD-10-CM | POA: Diagnosis not present

## 2019-01-04 DIAGNOSIS — J069 Acute upper respiratory infection, unspecified: Secondary | ICD-10-CM | POA: Diagnosis not present

## 2019-01-04 DIAGNOSIS — D126 Benign neoplasm of colon, unspecified: Secondary | ICD-10-CM

## 2019-01-04 DIAGNOSIS — R0602 Shortness of breath: Secondary | ICD-10-CM | POA: Diagnosis not present

## 2019-01-04 DIAGNOSIS — L089 Local infection of the skin and subcutaneous tissue, unspecified: Secondary | ICD-10-CM

## 2019-01-04 DIAGNOSIS — E039 Hypothyroidism, unspecified: Secondary | ICD-10-CM

## 2019-01-04 LAB — HEPATIC FUNCTION PANEL
ALT: 15 U/L (ref 0–35)
AST: 21 U/L (ref 0–37)
Albumin: 4 g/dL (ref 3.5–5.2)
Alkaline Phosphatase: 70 U/L (ref 39–117)
BILIRUBIN TOTAL: 0.4 mg/dL (ref 0.2–1.2)
Bilirubin, Direct: 0.1 mg/dL (ref 0.0–0.3)
Total Protein: 6.5 g/dL (ref 6.0–8.3)

## 2019-01-04 LAB — LIPID PANEL
Cholesterol: 177 mg/dL (ref 0–200)
HDL: 71.7 mg/dL (ref >39.00)
LDL Cholesterol: 87 mg/dL (ref 0–99)
NonHDL: 105.05
Total CHOL/HDL Ratio: 2
Triglycerides: 91 mg/dL (ref 0.0–149.0)
VLDL: 18.2 mg/dL (ref 0.0–40.0)

## 2019-01-04 LAB — CBC WITH DIFFERENTIAL/PLATELET
BASOS PCT: 0.5 % (ref 0.0–3.0)
Basophils Absolute: 0 10*3/uL (ref 0.0–0.1)
Eosinophils Absolute: 0 10*3/uL (ref 0.0–0.7)
Eosinophils Relative: 0.3 % (ref 0.0–5.0)
HEMATOCRIT: 38.3 % (ref 36.0–46.0)
HEMOGLOBIN: 13.4 g/dL (ref 12.0–15.0)
Lymphocytes Relative: 16.5 % (ref 12.0–46.0)
Lymphs Abs: 1.2 10*3/uL (ref 0.7–4.0)
MCHC: 35.1 g/dL (ref 30.0–36.0)
MCV: 90.1 fl (ref 78.0–100.0)
Monocytes Absolute: 0.5 10*3/uL (ref 0.1–1.0)
Monocytes Relative: 7.4 % (ref 3.0–12.0)
Neutro Abs: 5.6 10*3/uL (ref 1.4–7.7)
Neutrophils Relative %: 75.3 % (ref 43.0–77.0)
Platelets: 359 10*3/uL (ref 150.0–400.0)
RBC: 4.25 Mil/uL (ref 3.87–5.11)
RDW: 12.5 % (ref 11.5–15.5)
WBC: 7.4 10*3/uL (ref 4.0–10.5)

## 2019-01-04 LAB — BASIC METABOLIC PANEL
BUN: 19 mg/dL (ref 6–23)
CALCIUM: 9.7 mg/dL (ref 8.4–10.5)
CO2: 29 mEq/L (ref 19–32)
CREATININE: 0.67 mg/dL (ref 0.40–1.20)
Chloride: 98 mEq/L (ref 96–112)
GFR: 85.3 mL/min (ref >60.00)
Glucose, Bld: 97 mg/dL (ref 70–99)
Potassium: 4.2 mEq/L (ref 3.5–5.1)
Sodium: 135 mEq/L (ref 135–145)

## 2019-01-04 LAB — TSH: TSH: 2.62 u[IU]/mL (ref 0.35–4.50)

## 2019-01-04 MED ORDER — MUPIROCIN 2 % EX OINT: TOPICAL_OINTMENT | CUTANEOUS | 0 refills | Status: DC

## 2019-01-04 NOTE — Progress Notes (Signed)
Patient ID: Sherry Bernard, female   DOB: January 21, 1941, 78 y.o.   MRN: [id number]   Subjective:    Patient ID: Sherry Bernard, female    DOB: [birth year], 78 y.o.   MRN: [id number]  HPI  Patient here for a scheduled follow up.  She was evaluated 12/17/18 and diagnosed with URI.  Treated with prednisone and hycodan.  Reevaluated 12/24/18.  Given astelin and omnicef.  Feels better.  No sinus pressure.  Minimal drainage and cough.  No chest congestion.  No sob.  No acid reflux.  No abdominal pain.  Bowels moving.  No urine change.  She has also seen [facility] for f/u pancreas cyst.  Reviewed note.  Felt stable.  No further w/up felt warranted.  Some redness around her nail bed - index finger left hand.     Past Medical History:  Diagnosis Date  . Colon polyp   . Hyperthyroidism    Past Surgical History:  Procedure Laterality Date  . APPENDECTOMY     child  . TONSILECTOMY, ADENOIDECTOMY, BILATERAL MYRINGOTOMY AND TUBES     child   Family History  Problem Relation Age of Onset  . Breast cancer Mother    Social History   Socioeconomic History  . Marital status: Married    Spouse name: Not on file  . Number of children: 3  . Years of education: Not on file  . Highest education level: Not on file  Occupational History  . Occupation: retired  Scientific laboratory technician  . Financial resource strain: Not hard at all  . Food insecurity:    Worry: Never true    Inability: Never true  . Transportation needs:    Medical: No    Non-medical: No  Tobacco Use  . Smoking status: Never Smoker  . Smokeless tobacco: Never Used  Substance and Sexual Activity  . Alcohol use: Yes    Comment: socially  . Drug use: No  . Sexual activity: Yes  Lifestyle  . Physical activity:    Days per week: 7 days    Minutes per session: 60 min  . Stress: Not at all  Relationships  . Social connections:    Talks on phone: Not on file    Gets together: Not on file    Attends religious service: Not on file    Active member of  club or organization: Not on file    Attends meetings of clubs or organizations: Not on file    Relationship status: Not on file  Other Topics Concern  . Not on file  Social History Narrative  . Not on file    Outpatient Encounter Medications as of 01/04/2019  Medication Sig  . Biotin 5 MG CAPS Take by mouth.  . Calcium Carbonate-Simethicone 1000-60 MG CHEW Chew by mouth.  . Calcium-Phosphorus-Vitamin D (CITRACAL +D3 PO) Take 2 tablets by mouth daily.  . Glucosamine HCl-MSM (SM GLUCOSAMINE HCL-MSM) 750-750 MG TABS Take by mouth.  . levothyroxine (SYNTHROID, LEVOTHROID) 75 MCG tablet TAKE 1 TABLET EVERY DAY ON EMPTY STOMACHWITH A GLASS OF WATER AT LEAST 30-60 MINBEFORE BREAKFAST  . Multiple Vitamins-Minerals (CENTRUM SILVER 50+WOMEN) TABS Take 1 tablet by mouth daily.  . mupirocin ointment (BACTROBAN) 2 % Apply to affected area bid  . Omega-3 Fatty Acids (OMEGA-3 FISH OIL) 1200 MG CAPS Take by mouth.  . Probiotic Product (PROBIOTIC-10) CAPS Take by mouth.  . [DISCONTINUED] mupirocin ointment (BACTROBAN) 2 % Apply to affected area bid   No facility-administered encounter medications on  file as of 01/04/2019.     Review of Systems  Constitutional: Negative for appetite change and unexpected weight change.  HENT: Positive for postnasal drip. Negative for sinus pressure.   Respiratory: Positive for cough. Negative for chest tightness and shortness of breath.        Minimal cough.  Improved.    Cardiovascular: Negative for chest pain, palpitations and leg swelling.  Gastrointestinal: Negative for abdominal pain, diarrhea, nausea and vomiting.  Genitourinary: Negative for difficulty urinating and dysuria.  Musculoskeletal: Negative for joint swelling and myalgias.  Skin: Negative for color change and rash.  Neurological: Negative for dizziness, light-headedness and headaches.  Psychiatric/Behavioral: Negative for agitation and dysphoric mood.       Objective:    Physical Exam  Constitutional:      General: She is not in acute distress.    Appearance: Normal appearance.  HENT:     Nose: Nose normal. No congestion.     Mouth/Throat:     Pharynx: No oropharyngeal exudate or posterior oropharyngeal erythema.  Neck:     Musculoskeletal: Neck supple. No muscular tenderness.     Thyroid: No thyromegaly.  Cardiovascular:     Rate and Rhythm: Normal rate and regular rhythm.  Pulmonary:     Effort: No respiratory distress.     Breath sounds: Normal breath sounds. No wheezing.  Abdominal:     General: Bowel sounds are normal.     Palpations: Abdomen is soft.     Tenderness: There is no abdominal tenderness.  Musculoskeletal:        General: No swelling or tenderness.  Lymphadenopathy:     Cervical: No cervical adenopathy.  Skin:    Findings: No rash.     Comments: Minimal erythema - around nail bed index finger (left).   Neurological:     Mental Status: She is alert.  Psychiatric:        Mood and Affect: Mood normal.        Behavior: Behavior normal.     BP 110/68   Pulse 73   Temp 97.6 F (36.4 C) (Oral)   Resp 16   Wt 125 lb (56.7 kg)   SpO2 96%   BMI 22.14 kg/m  Wt Readings from Last 3 Encounters:  01/04/19 125 lb (56.7 kg)  10/20/18 125 lb 6.4 oz (56.9 kg)  10/18/18 122 lb (55.3 kg)     Lab Results  Component Value Date   WBC 7.4 01/04/2019   HGB 13.4 01/04/2019   HCT 38.3 01/04/2019   PLT 359.0 01/04/2019   GLUCOSE 97 01/04/2019   CHOL 177 01/04/2019   TRIG 91.0 01/04/2019   HDL 71.70 01/04/2019   LDLCALC 87 01/04/2019   ALT 15 01/04/2019   AST 21 01/04/2019   NA 135 01/04/2019   K 4.2 01/04/2019   CL 98 01/04/2019   CREATININE 0.67 01/04/2019   BUN 19 01/04/2019   CO2 29 01/04/2019   TSH 2.62 01/04/2019   INR 0.8 02/29/2012    Mr Abdomen Mrcp W Wo Contast  Result Date: 07/26/2018 CLINICAL DATA:  Pancreatic and liver lesions on previous outside CT. EXAM: MRI ABDOMEN WITHOUT AND WITH CONTRAST (INCLUDING MRCP) TECHNIQUE:  Multiplanar multisequence MR imaging of the abdomen was performed both before and after the administration of intravenous contrast. Heavily T2-weighted images of the biliary and pancreatic ducts were obtained, and three-dimensional MRCP images were rendered by post processing. CONTRAST:  6 mL Gadavist COMPARISON:  Noncontrast lumbar spine CT on 01/20/2016 FINDINGS:  Lower chest: No acute findings. Hepatobiliary: Several small benign hemangiomas are seen, largest in the posterior right hepatic lobe measuring 2 cm. A tiny sub-cm cyst is also seen in segment 2 of the left lobe. Gallbladder is unremarkable. No evidence of biliary ductal dilatation. Pancreas: Several tiny adjacent cystic lesions are seen in the pancreatic body measuring up to 9 mm. These lesions are difficult to characterize due to their small size, and may represent indolent cystic neoplasms or small pseudocysts. No solid pancreatic masses identified. No evidence of pancreatic ductal dilatation or pancreas divisum. Spleen:  Within normal limits in size and appearance. Adrenals/Urinary Tract: No masses identified. No evidence of hydronephrosis. Stomach/Bowel: Visualized portion unremarkable. Vascular/Lymphatic: No pathologically enlarged lymph nodes identified. No abdominal aortic aneurysm. Other:  None. Musculoskeletal:  No suspicious bone lesions identified. IMPRESSION: Adjacent tiny sub-cm cystic lesions in the pancreatic body, which may represent indolent cystic neoplasms or small pseudocysts. Recommend continued follow-up by MRI in 2 years. This recommendation follows ACR consensus guidelines: Management of Incidental Pancreatic Cysts: A White Paper of the ACR Incidental Findings Committee. [place] 9622;29:[id number]. Small benign hepatic hemangiomas. Electronically Signed   By: Earle Gell M.D.   On: 07/26/2018 14:47       Assessment & Plan:   Problem List Items Addressed This Visit    Acquired hypothyroidism    On thyroid replacement.   Follow tsh.       Anxiety    Overall she feels she is doing relatively well.  No further intervention felt warranted.  Follow.       Cyst of pancreas    Saw [facility].  Felt stable.  No further f/u or evaluation warranted.        Serrated adenoma of colon    Colonoscopy 2017.  Recommended f/u in 3 years.  Make sure URI cleared.  Follow.       Skin infection    Minimal erythema - nail bed as outlined.  bactroban as directed.  Follow.        Relevant Medications   mupirocin ointment (BACTROBAN) 2 %   RESOLVED: SOB (shortness of breath) on exertion   URI (upper respiratory infection)    With minimal cough.  Feels better.  Continue astelin.  Robitussin DM as directed.  Follow.        Relevant Medications   mupirocin ointment (BACTROBAN) 2 %    Other Visit Diagnoses    Screening cholesterol level       Infection of skin of finger       Relevant Medications   mupirocin ointment (BACTROBAN) 2 %       Einar Pheasant, MD

## 2019-01-04 NOTE — Assessment & Plan Note (Signed)
On thyroid replacement.  Follow tsh.  

## 2019-01-04 NOTE — Assessment & Plan Note (Signed)
With minimal cough.  Feels better.  Continue astelin.  Robitussin DM as directed.  Follow.

## 2019-01-04 NOTE — Assessment & Plan Note (Signed)
Overall she feels she is doing relatively well.  No further intervention felt warranted.  Follow.

## 2019-01-04 NOTE — Assessment & Plan Note (Signed)
Colonoscopy 2017.  Recommended f/u in 3 years.  Make sure URI cleared.  Follow.

## 2019-01-04 NOTE — Patient Instructions (Signed)
Robitussin DM - take twice a day as needed for cough and congestion.

## 2019-01-04 NOTE — Assessment & Plan Note (Signed)
Saw [facility].  Felt stable.  No further f/u or evaluation warranted.

## 2019-01-04 NOTE — Assessment & Plan Note (Signed)
Minimal erythema - nail bed as outlined.  bactroban as directed.  Follow.

## 2019-01-10 ENCOUNTER — Other Ambulatory Visit: Payer: Self-pay | Admitting: Internal Medicine

## 2019-01-17 ENCOUNTER — Encounter: Payer: Self-pay | Admitting: Plastic Surgery

## 2019-01-26 ENCOUNTER — Telehealth: Payer: Self-pay | Admitting: Plastic Surgery

## 2019-01-26 NOTE — Telephone Encounter (Signed)
Called patient to confirm appointment scheduled for tomorrow. Patient answered the following questions: °1.Has the patient traveled outside of the state of [place] at all within the past 6 weeks? No °2.Does the patient have a fever or cough at all? No °3.Has the patient been tested for COVID? Had a positive COVID test? No °4. Has the patient been in contact with anyone who has tested positive? No ° °

## 2019-01-27 ENCOUNTER — Encounter: Payer: Self-pay | Admitting: Plastic Surgery

## 2019-01-27 ENCOUNTER — Ambulatory Visit (INDEPENDENT_AMBULATORY_CARE_PROVIDER_SITE_OTHER): Payer: Self-pay | Admitting: Plastic Surgery

## 2019-01-27 ENCOUNTER — Other Ambulatory Visit: Payer: Self-pay

## 2019-01-27 VITALS — BP 137/82 | HR 75 | Temp 97.7°F | Ht 63.5 in | Wt 123.0 lb

## 2019-01-27 DIAGNOSIS — Z719 Counseling, unspecified: Secondary | ICD-10-CM

## 2019-01-27 NOTE — Progress Notes (Signed)
Botulinum Toxin Procedure Note  Procedure: Cosmetic botulinum toxin  Pre-operative Diagnosis: Dynamic rhytides   Post-operative Diagnosis: Same  Complications:  None  Brief history: The patient desires botulinum toxin injection of her forehead. I discussed with the patient this proposed procedure of botulinum toxin injections, which is customized depending on the particular needs of the patient. It is performed on facial rhytids as a temporary correction. The alternatives were discussed with the patient. The risks were addressed including bleeding, scarring, infection, damage to deeper structures, asymmetry, and chronic pain, which may occur infrequently after a procedure. The individual's choice to undergo a surgical procedure is based on the comparison of risks to potential benefits. Other risks include unsatisfactory results, brow ptosis, eyelid ptosis, allergic reaction, temporary paralysis, which should go away with time, bruising, blurring disturbances and delayed healing. Botulinum toxin injections do not arrest the aging process or produce permanent tightening of the eyelid.  Operative intervention maybe necessary to maintain the results of a blepharoplasty or botulinum toxin. The patient understands and wishes to proceed. An informed consent was signed and informational brochures given to her prior to the procedure.  Procedure: The area was prepped with alcohol and dried with a clean gauze. Using a clean technique, the botulinum toxin was diluted with 1.25 cc of preservative-free normal saline which was slowly injected with an 18 gauge needle in a tuberculin syringes.  A 32 gauge needles were then used to inject the botulinum toxin. This mixture allow for an aliquot of 5 units per 0.1 cc in each injection site.    Subsequently the mixture was injected in the glabellar and forehead area with preservation of the temporal branch to the lateral eyebrow as well as into each lateral canthal area  beginning from the lateral orbital rim medial to the zygomaticus major in 3 separate areas. A total of 30 Units of botulinum toxin was used. The forehead and glabellar area was injected with care to inject intramuscular only while holding pressure on the supratrochlear vessels in each area during each injection on either side of the medial corrugators. The injection proceeded vertically superiorly to the medial 2/3 of the frontalis muscle and superior 2/3 of the lateral frontalis, again with preservation of the frontal branch.  No complications were noted. Light pressure was held for 5 minutes. She was instructed explicitly in post-operative care.  Botox LOT:  [id number] C2 EXP:  5/22

## 2019-02-24 ENCOUNTER — Other Ambulatory Visit: Payer: Self-pay | Admitting: Internal Medicine

## 2019-02-24 DIAGNOSIS — L089 Local infection of the skin and subcutaneous tissue, unspecified: Secondary | ICD-10-CM

## 2019-02-24 NOTE — Telephone Encounter (Signed)
LMTCB

## 2019-02-24 NOTE — Telephone Encounter (Signed)
Received request for refill bactroban. Please confirm with pt if she needs bactroban ointment.  If she has a persistent non healing area, will need to be evaluated.

## 2019-02-24 NOTE — Telephone Encounter (Signed)
Pt called regarding this medication. Please advise.

## 2019-02-25 ENCOUNTER — Other Ambulatory Visit: Payer: Self-pay | Admitting: Internal Medicine

## 2019-02-25 DIAGNOSIS — L089 Local infection of the skin and subcutaneous tissue, unspecified: Secondary | ICD-10-CM

## 2019-02-27 ENCOUNTER — Telehealth: Payer: Self-pay

## 2019-02-27 ENCOUNTER — Telehealth: Payer: Self-pay | Admitting: Internal Medicine

## 2019-02-27 NOTE — Telephone Encounter (Signed)
I refilled the bactroban, but if she is still having problems, will need to be evaluated.

## 2019-02-27 NOTE — Telephone Encounter (Signed)
Copied from [place] [id number]. Topic: Quick Communication - Rx Refill/Question >> Feb 27, 2019 12:56 PM Burchel, Abbi R wrote: Medication: mupirocin ointment (BACTROBAN) 2 %   Preferred Pharmacy: [place], [place] - [place]  [phone] (Phone) [phone] (Fax)  Pt was advised that RX refills may take up to 3 business days. We ask that you follow-up with your pharmacy.

## 2019-02-27 NOTE — Telephone Encounter (Signed)
See other phone message.  I had sent in refill for the medication, but if persistent, will need to be evaluated.

## 2019-02-27 NOTE — Telephone Encounter (Signed)
Pt stated she needs refill of Mupirocin Her finger still looks red around the nail. It is not painful. Please advise. [address]   [facility] - [place], [place] [phone] (Phone) [phone] (Fax)

## 2019-02-27 NOTE — Telephone Encounter (Signed)
Pt has video visit in the am.

## 2019-02-28 ENCOUNTER — Encounter: Payer: Self-pay | Admitting: Internal Medicine

## 2019-02-28 ENCOUNTER — Other Ambulatory Visit: Payer: Self-pay

## 2019-02-28 ENCOUNTER — Ambulatory Visit (INDEPENDENT_AMBULATORY_CARE_PROVIDER_SITE_OTHER): Payer: PPO | Admitting: Internal Medicine

## 2019-02-28 DIAGNOSIS — F419 Anxiety disorder, unspecified: Secondary | ICD-10-CM

## 2019-02-28 DIAGNOSIS — M545 Low back pain, unspecified: Secondary | ICD-10-CM

## 2019-02-28 DIAGNOSIS — L089 Local infection of the skin and subcutaneous tissue, unspecified: Secondary | ICD-10-CM | POA: Diagnosis not present

## 2019-02-28 DIAGNOSIS — E039 Hypothyroidism, unspecified: Secondary | ICD-10-CM | POA: Diagnosis not present

## 2019-02-28 DIAGNOSIS — D126 Benign neoplasm of colon, unspecified: Secondary | ICD-10-CM

## 2019-02-28 NOTE — Progress Notes (Addendum)
Patient ID: Sherry Bernard, female   DOB: 18-Sep-1941, 78 y.o.   MRN: [id number]   Virtual Visit via video Note  This visit type was conducted due to national recommendations for restrictions regarding the COVID-19 pandemic (e.g. social distancing).  This format is felt to be most appropriate for this patient at this time.  All issues noted in this document were discussed and addressed.  No physical exam was performed (except for noted visual exam findings with Video Visits).   I connected with Sherry Bernard by a video enabled telemedicine application and verified that I am speaking with the correct person using two identifiers. Location patient: home Location provider: work  Persons participating in the virtual visit: patient, provider  I discussed the limitations, risks, security and privacy concerns of performing an evaluation and management service by video and the availability of in person appointments. The patient expressed understanding and agreed to proceed.   Reason for visit: acute visit.   HPI: She reports she is doing relatively well.  Did want her finger rechecked.  Previous visit, I had given her rx for bactroban.  She was concerned - finger still an issue.  In reviewing, finger appears to be without increased redness.  No pain.  No fever.  No increased cough or congestion.  No sob chest pain or tightness.  No acid reflux.  No abdominal pain.  Bowels moving.  benefiber has helped her bowels stay regular.  Some low back pain.  Previous xray reveals stable remote compression fracture and some degenerative changes.  This xray from 2018.  No pain radiating down leg.  No numbness or tingling.     ROS: See pertinent positives and negatives per HPI.  Past Medical History:  Diagnosis Date  . Colon polyp   . Hyperthyroidism     Past Surgical History:  Procedure Laterality Date  . APPENDECTOMY     child  . TONSILECTOMY, ADENOIDECTOMY, BILATERAL MYRINGOTOMY AND TUBES     child     Family History  Problem Relation Age of Onset  . Breast cancer Mother     SOCIAL HX: reviewed.    Current Outpatient Medications:  .  Biotin 5 MG CAPS, Take by mouth., Disp: , Rfl:  .  Calcium Carbonate-Simethicone 1000-60 MG CHEW, Chew by mouth., Disp: , Rfl:  .  Calcium-Phosphorus-Vitamin D (CITRACAL +D3 PO), Take 2 tablets by mouth daily., Disp: , Rfl:  .  Glucosamine HCl-MSM (SM GLUCOSAMINE HCL-MSM) 750-750 MG TABS, Take by mouth., Disp: , Rfl:  .  levothyroxine (SYNTHROID, LEVOTHROID) 75 MCG tablet, TAKE 1 TABLET BY MOUTH ONCE DAILY. TAKE ON EMPTY STOMACH WITH A GLASSOF WATER AT LEAST 30-60 MINUTES BEFORE BREAKFAST, Disp: 90 tablet, Rfl: 1 .  Multiple Vitamins-Minerals (CENTRUM SILVER 50+WOMEN) TABS, Take 1 tablet by mouth daily., Disp: , Rfl:  .  mupirocin ointment (BACTROBAN) 2 %, APPLY TO AFFECTED AREAS TWICE DAILY, Disp: 22 g, Rfl: 0 .  Omega-3 Fatty Acids (OMEGA-3 FISH OIL) 1200 MG CAPS, Take by mouth., Disp: , Rfl:  .  Probiotic Product (PROBIOTIC-10) CAPS, Take by mouth., Disp: , Rfl:   EXAM:  GENERAL: alert, oriented, appears well and in no acute distress  HEENT: atraumatic, conjunttiva clear, no obvious abnormalities on inspection of external nose and ears  NECK: normal movements of the head and neck  LUNGS: on inspection no signs of respiratory distress, breathing rate appears normal, no obvious gross SOB, gasping or wheezing  CV: no obvious cyanosis  PSYCH/NEURO: pleasant and cooperative,  no obvious depression or anxiety, speech and thought processing grossly intact  SKIN:  No significant redness noted on the finger or nail bed.  No pain to palpation.    ASSESSMENT AND PLAN:  Discussed the following assessment and plan:  Acquired hypothyroidism  Anxiety  Midline low back pain without sciatica, unspecified chronicity - Plan: Ambulatory referral to Physical Therapy  Serrated adenoma of colon - Plan: Ambulatory referral to Gastroenterology  Finger  infection  Acquired hypothyroidism On thyroid replacement. Follow tsh.   Anxiety Overall stable.  Follow.   Low back pain Had previous xray with arthritis/degenerative changes and stable compression fracture.  Some persistent pain. Discussed physical therapy.  She is in agreement.    Serrated adenoma of colon Colonoscopy 2017.  Recommended f/u in 3 years.    Finger infection Previous finger infection.  Uses bactroban.  Appears to be improved/resolved.  Follow.      I discussed the assessment and treatment plan with the patient. The patient was provided an opportunity to ask questions and all were answered. The patient agreed with the plan and demonstrated an understanding of the instructions.   The patient was advised to call back or seek an in-person evaluation if the symptoms worsen or if the condition fails to improve as anticipated.    Einar Pheasant, MD

## 2019-02-28 NOTE — Telephone Encounter (Signed)
Pt was scheduled with provider to look at finger.  Sherry Bernard,cma

## 2019-03-04 ENCOUNTER — Encounter: Payer: Self-pay | Admitting: Internal Medicine

## 2019-03-04 NOTE — Assessment & Plan Note (Signed)
Had previous xray with arthritis/degenerative changes and stable compression fracture.  Some persistent pain. Discussed physical therapy.  She is in agreement.

## 2019-03-04 NOTE — Assessment & Plan Note (Signed)
Colonoscopy 2017.  Recommended f/u in 3 years.

## 2019-03-04 NOTE — Assessment & Plan Note (Signed)
Overall stable.  Follow.

## 2019-03-04 NOTE — Assessment & Plan Note (Signed)
On thyroid replacement.  Follow tsh.  

## 2019-03-05 DIAGNOSIS — L089 Local infection of the skin and subcutaneous tissue, unspecified: Secondary | ICD-10-CM | POA: Insufficient documentation

## 2019-03-05 NOTE — Assessment & Plan Note (Signed)
Previous finger infection.  Uses bactroban.  Appears to be improved/resolved.  Follow.

## 2019-03-15 ENCOUNTER — Other Ambulatory Visit: Payer: Self-pay

## 2019-03-15 ENCOUNTER — Ambulatory Visit: Payer: PPO | Attending: Internal Medicine

## 2019-03-15 DIAGNOSIS — M545 Low back pain, unspecified: Secondary | ICD-10-CM

## 2019-03-15 DIAGNOSIS — M25651 Stiffness of right hip, not elsewhere classified: Secondary | ICD-10-CM | POA: Insufficient documentation

## 2019-03-15 DIAGNOSIS — M25652 Stiffness of left hip, not elsewhere classified: Secondary | ICD-10-CM | POA: Insufficient documentation

## 2019-03-15 NOTE — Therapy (Signed)
[facility] [address], [place], [address] Phone: [phone]   Fax:  [phone]  Physical Therapy Evaluation  Patient Details  Name: Sherry Bernard MRN: [id number] Date of Birth: [birth year] Referring Provider (PT): Nicki Reaper MD   Encounter Date: 03/15/2019  PT End of Session - 03/15/19 1529    Visit Number  1    Number of Visits  13    Date for PT Re-Evaluation  04/26/19    PT Start Time  1000    PT Stop Time  1059    PT Time Calculation (min)  59 min    Activity Tolerance  Patient tolerated treatment well    Behavior During Therapy  [facility] for tasks assessed/performed       Past Medical History:  Diagnosis Date  . Colon polyp   . Hyperthyroidism     Past Surgical History:  Procedure Laterality Date  . APPENDECTOMY     child  . TONSILECTOMY, ADENOIDECTOMY, BILATERAL MYRINGOTOMY AND TUBES     child    There were no vitals filed for this visit.   Subjective Assessment - 03/15/19 1018    Subjective  Patient demonstrates increased low back pain which started with idsidious onset. Patient reports increased pain with sitting and decreased pain with walking and standing. Patient states she also has increased L shoulder pain from trying to lift something overhead that' was heavy. Patient demonstrates improvement over time but states the low back has been getting worse since the onset. Patient states walking for long distances helps considerably with her LBP but states she needs to walk ~ 5 min to feel the analgesic effects. Patient states she is worried about needing surgery for her condition and feels like she is "fall apart". Patient would like to focus on techniques that lower her pain and get her back to her active lifestyle. Patient is also concerned about her posture and would like to avoid developing excessive kyphosis.     Pertinent History  hyperthyroidism     Limitations  Sitting;Lifting    How long  can you sit comfortably?  Hurts with going from sitting to standing - decreases after 10 min of walking     Diagnostic tests  no    Patient Stated Goals  to decrease pain    Currently in Pain?  Yes    Pain Score  5    worst: 7/ 10; best: 3/10   Pain Location  Back    Pain Orientation  Left    Pain Descriptors / Indicators  Aching;Discomfort;Sore    Pain Type  Chronic pain    Pain Onset  More than a month ago    Pain Frequency  Intermittent         [facility] PT Assessment - 03/15/19 1012      Assessment   Medical Diagnosis  LBP    Referring Provider (PT)  Scott MD    Onset Date/Surgical Date  12/11/18    Hand Dominance  Right    Next MD Visit  unknown    Prior Therapy  yes for shoulder      Balance Screen   Has the patient fallen in the past 6 months  No    Has the patient had a decrease in activity level because of a fear of falling?   Yes    Is the patient reluctant to leave their home because of a fear of falling?   No  Home Environment   Living Environment  Private residence    Living Arrangements  Spouse/significant other    Available Help at Discharge  Family    Type of [place] to enter    Entrance Stairs-Number of Steps  15    Entrance Stairs-Rails  Right    [place]  Two level      Prior Function   Level of Independence  Independent    Vocation  Retired    Biomedical scientist  N/a    Leisure  work out, walk, travel, grand children      Cognition   Overall Cognitive Status  Within Functional Limits for tasks assessed      Sensation   Light Touch  Appears Intact      Functional Tests   Functional tests  Sit to Stand      Sit to Stand   Comments  Able to perform but demonstrates increased pain with performance      Posture/Postural Control   Posture Comments  FHP, kyphosis minimal      ROM / Strength   AROM / PROM / Strength  AROM;Strength      AROM   AROM Assessment Site  Lumbar;Hip;Knee    Right/Left Hip   Left;Right    Right Hip Extension  5    Right Hip Flexion  110    Right Hip External Rotation   30    Right Hip Internal Rotation   25    Right Hip ABduction  25   pain   Left Hip Extension  5    Left Hip Flexion  110    Left Hip External Rotation   30    Left Hip Internal Rotation   25    Left Hip ABduction  20   pain   Right/Left Knee  Right;Left    Right Knee Extension  0    Right Knee Flexion  110    Left Knee Extension  0    Left Knee Flexion  110    Lumbar Flexion  WNL    Lumbar Extension  50% limited   increased pain   Lumbar - Right Side Bend  25% limited   increased pain   Lumbar - Left Side Bend  25% limited    Lumbar - Right Rotation  0%    Lumbar - Left Rotation  0%      Strength   Strength Assessment Site  Hip;Lumbar    Right/Left Hip  Right;Left    Right Hip Flexion  4/5    Right Hip Extension  4/5    Right Hip External Rotation   4/5    Right Hip Internal Rotation  4+/5    Right Hip ABduction  4-/5    Right Hip ADduction  4+/5    Left Hip Flexion  4/5    Left Hip Extension  4/5    Left Hip External Rotation  4/5    Left Hip Internal Rotation  4+/5    Left Hip ABduction  4-/5    Left Hip ADduction  4/5    Lumbar Flexion  4+/5    Lumbar Extension  4+/5      Palpation   Spinal mobility  Hypomobility L3-5    Palpation comment  TTP along Multifidi on the L side      Special Tests    Special Tests  Lumbar    Lumbar Tests  FABER test  FABER test   findings  Positive    Comment  on the L      Ambulation/Gait   Gait Comments  Decreased hip extension with ambulation      High Level Balance   High Level Balance Comments  SLS: <5sec B      Objective measurements completed on examination: See above findings.   TREATMENT: Therapeutic Exercise: Standing hip/lumbar extension -- x 10 Bridges in hooklying -- x 20  Sit to stand with extension at end range -- x 10  Performed exercises to decrease muscular guarding and decrease overall pain.  Patient demonstrates less pain at the end of the session           PT Education - 03/15/19 1525    Education Details  HEP: bridges, lumbar extension in standing, sit to stands with lumbar extension    Person(s) Educated  Patient    Methods  Explanation;Demonstration    Comprehension  Verbalized understanding;Returned demonstration          PT Long Term Goals - 03/15/19 1612      PT LONG TERM GOAL #1   Title  Patient will be independent with HEP to continue benefits of therapy after discharge.    Baseline  Dependent with form/technique    Time  6    Period  Weeks    Status  New    Target Date  04/26/19      PT LONG TERM GOAL #2   Title  Patient will be able to sit for >1 hour without increase in pain to better be able to rest comfortably without increase in pain    Baseline  Increased pain after 5 to 10 min     Time  6    Period  Weeks    Status  New    Target Date  04/26/19      PT LONG TERM GOAL #3   Title  Patient will have a worst pain in the low back to under 3/10 to demonstrate improvement with functional activities.    Baseline  7/10 worst pain    Time  6    Period  Weeks    Status  New    Target Date  04/26/19             Plan - 03/15/19 1608    Clinical Impression Statement  Patient is a 78 yo right hand dominant female presenting with increased low back pain. Patient demonstrates lumbar dysfunction as indicated by increased pain with performing bending and sitting tasks and possible multifidus invovlement based on location of TTP. Patient also demonstrates poor hip rotational strength limiting ability to perform cetain exercises. Patient will benefit from furher skilled therapy to return to prior level of function.     Personal Factors and Comorbidities  Age;Comorbidity 1    Comorbidities  hyperthyroidism    Examination-Activity Limitations  Bend;Sit    Examination-Participation Restrictions  Community Activity    Stability/Clinical Decision  Making  Stable/Uncomplicated    Clinical Decision Making  Low    Rehab Potential  Good    PT Frequency  2x / week    PT Duration  6 weeks    PT Treatment/Interventions  Therapeutic exercise;Therapeutic activities;Balance training;Neuromuscular re-education;Cognitive remediation;Gait training;Electrical Stimulation;Moist Heat;Stair training;Patient/family education;Dry needling;Passive range of motion;Joint Manipulations;Spinal Manipulations    PT Next Visit Plan  progress strengthening and AROM based exercises    PT Home Exercise Plan  See education section    Consulted and  Agree with Plan of Care  Patient       Patient will benefit from skilled therapeutic intervention in order to improve the following deficits and impairments:  Abnormal gait, Increased fascial restricitons, Pain, Decreased mobility, Decreased coordination, Increased muscle spasms, Decreased activity tolerance, Decreased strength, Decreased range of motion, Decreased endurance, Difficulty walking  Visit Diagnosis: Acute bilateral low back pain without sciatica  Stiffness of right hip, not elsewhere classified  Stiffness of left hip, not elsewhere classified     Problem List Patient Active Problem List   Diagnosis Date Noted  . Finger infection 03/05/2019  . Skin infection 01/04/2019  . URI (upper respiratory infection) 01/04/2019  . Healthcare maintenance 12/05/2018  . Left shoulder pain 03/12/2018  . Anxiety 08/18/2017  . Hematuria 08/16/2017  . Low back pain 01/10/2017  . Encounter for counseling 11/06/2016  . Cough 10/06/2016  . Diarrhea 10/06/2016  . Fibrocystic breast disease 05/05/2016  . Osteopenia 05/05/2016  . Cyst of pancreas 08/13/2015  . 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 Acquired hypothyroidism 08/02/2014  . Rhytides 11/20/2013  . Postmenopausal 10/13/1991    Blythe Stanford, PT DPT 03/15/2019, 4:28 PM  [facility] [address] [place], [address] Phone: [phone]   Fax:  [phone]  Name: ADRIEL DESROSIER MRN: [id number] Date of Birth: August 24, 1941

## 2019-03-20 ENCOUNTER — Ambulatory Visit: Payer: PPO

## 2019-03-20 ENCOUNTER — Other Ambulatory Visit: Payer: Self-pay

## 2019-03-20 DIAGNOSIS — M545 Low back pain, unspecified: Secondary | ICD-10-CM

## 2019-03-20 DIAGNOSIS — M25651 Stiffness of right hip, not elsewhere classified: Secondary | ICD-10-CM

## 2019-03-20 DIAGNOSIS — M25652 Stiffness of left hip, not elsewhere classified: Secondary | ICD-10-CM

## 2019-03-20 NOTE — Therapy (Signed)
[facility] [address] [place], [address] Phone: [phone]   Fax:  [phone]  Physical Therapy Treatment  Patient Details  Name: Sherry Bernard MRN: [id number] Date of Birth: Aug 18, 1941 Referring Provider (PT): Nicki Reaper MD   Encounter Date: 03/20/2019  PT End of Session - 03/20/19 0901    Visit Number  2    Number of Visits  13    Date for PT Re-Evaluation  04/26/19    PT Start Time  0902    PT Stop Time  0946    PT Time Calculation (min)  44 min    Activity Tolerance  Patient tolerated treatment well    Behavior During Therapy  [facility] for tasks assessed/performed       Past Medical History:  Diagnosis Date  . Colon polyp   . Hyperthyroidism     Past Surgical History:  Procedure Laterality Date  . APPENDECTOMY     child  . TONSILECTOMY, ADENOIDECTOMY, BILATERAL MYRINGOTOMY AND TUBES     child    There were no vitals filed for this visit.  Subjective Assessment - 03/20/19 0903    Subjective  Back is about the same. The side of the pain switches back and fort.  Her goal is to not have a back operation.  Does not know what Wes did last time that made if feel better. He might have been rubbing her back. Back pain returned eventually.  5/10 back pain currently.   Pt states R low back tends to bother her more than the L low back.      Pertinent History  hyperthyroidism     Limitations  Sitting;Lifting    How long can you sit comfortably?  Hurts with going from sitting to standing - decreases after 10 min of walking     Diagnostic tests  no    Patient Stated Goals  to decrease pain    Currently in Pain?  Yes    Pain Score  5     Pain Onset  More than a month ago                               PT Education - 03/20/19 0914    Education Details  ther-ex    Person(s) Educated  Patient    Methods  Explanation;Demonstration;Tactile cues;Verbal cues    Comprehension  Returned  demonstration;Verbalized understanding          Objective     TREATMENT: Therapeutic Exercise:  Standing hip/lumbar extension -- 2  x 10  Helps her back feel better.  Standing L lateral shift correction. Feels better for back   10x5 seconds for 2 sets   Decreased back pain    Bridges in hooklying -- x 20   Supine transversus abdominis contraction 10x5 seconds for 2 sets  Then with hip fallouts 10x each LE  Pt tendency for L pelvic rotation during L hip fallout  Increased time secondary to emphasis on proper technique  Seated L hip extension isometrics 10x5 seconds for 3 sets   Improved lumbopelvic posture. Decreased back pain in sitting.   Sit to stand with extension at end range -- x 10  Seated B scapular retraction 10x5 seconds for 3 sets to promote thoracic extension and decrease low back pressure   Sitting on R hip to decrease L lateral shift posture. Decreased back pain.  Improved exercise technique, movement at target joints, use of target muscles after mod verbal, visual, tactile cues.   Response to treatment Good transversus abdominis muscle contraction palpated. Improved proper scapular retraction and decreased shoulder shrug after cues. Improved posture with exercises with cues. Decreased back pain after session.    Clinical Impression Decreased back pain with treatment to promote gentle low back extension, decrease L lateral shift posture, and improving glute muscle use and and more neutral lumbopelvic posture. Pt will benefit from continued skilled physical therapy services to decrease pain, improve strength, posture, and function.        PT Long Term Goals - 03/15/19 1612      PT LONG TERM GOAL #1   Title  Patient will be independent with HEP to continue benefits of therapy after discharge.    Baseline  Dependent with form/technique    Time  6    Period  Weeks    Status  New    Target Date  04/26/19      PT LONG TERM GOAL #2   Title   Patient will be able to sit for >1 hour without increase in pain to better be able to rest comfortably without increase in pain    Baseline  Increased pain after 5 to 10 min     Time  6    Period  Weeks    Status  New    Target Date  04/26/19      PT LONG TERM GOAL #3   Title  Patient will have a worst pain in the low back to under 3/10 to demonstrate improvement with functional activities.    Baseline  7/10 worst pain    Time  6    Period  Weeks    Status  New    Target Date  04/26/19            Plan - 03/20/19 0858    Clinical Impression Statement  Decreased back pain with treatment to promote gentle low back extension, decrease L lateral shift posture, and improving glute muscle use and and more neutral lumbopelvic posture. Pt will benefit from continued skilled physical therapy services to decrease pain, improve strength, posture, and function.      Personal Factors and Comorbidities  Age;Comorbidity 1    Comorbidities  hyperthyroidism    Examination-Activity Limitations  Bend;Sit    Examination-Participation Restrictions  Community Activity    Stability/Clinical Decision Making  Stable/Uncomplicated    Rehab Potential  Good    PT Frequency  2x / week    PT Duration  6 weeks    PT Treatment/Interventions  Therapeutic exercise;Therapeutic activities;Balance training;Neuromuscular re-education;Cognitive remediation;Gait training;Electrical Stimulation;Moist Heat;Stair training;Patient/family education;Dry needling;Passive range of motion;Joint Manipulations;Spinal Manipulations    PT Next Visit Plan  progress strengthening and AROM based exercises    PT Home Exercise Plan  See education section    Consulted and Agree with Plan of Care  Patient       Patient will benefit from skilled therapeutic intervention in order to improve the following deficits and impairments:  Abnormal gait, Increased fascial restricitons, Pain, Decreased mobility, Decreased coordination, Increased  muscle spasms, Decreased activity tolerance, Decreased strength, Decreased range of motion, Decreased endurance, Difficulty walking  Visit Diagnosis: Acute bilateral low back pain without sciatica  Stiffness of right hip, not elsewhere classified  Stiffness of left hip, not elsewhere classified     Problem List Patient Active Problem List   Diagnosis Date Noted  . Finger  infection 03/05/2019  . Skin infection 01/04/2019  . URI (upper respiratory infection) 01/04/2019  . Healthcare maintenance 12/05/2018  . Left shoulder pain 03/12/2018  . Anxiety 08/18/2017  . Hematuria 08/16/2017  . Low back pain 01/10/2017  . Encounter for counseling 11/06/2016  . Cough 10/06/2016  . Diarrhea 10/06/2016  . Fibrocystic breast disease 05/05/2016  . Osteopenia 05/05/2016  . Cyst of pancreas 08/13/2015  . 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 Acquired hypothyroidism 08/02/2014  . Rhytides 11/20/2013  . Postmenopausal 10/13/1991   Joneen Boers PT, DPT   03/20/2019, 9:56 AM  [facility] [address], [place], [address] Phone: [phone]   Fax:  [phone]  Name: Sherry Bernard MRN: [id number] Date of Birth: Apr 05, 1941

## 2019-03-20 NOTE — Patient Instructions (Signed)
   Sit on your right hip

## 2019-03-22 ENCOUNTER — Other Ambulatory Visit: Payer: Self-pay

## 2019-03-22 ENCOUNTER — Ambulatory Visit: Payer: PPO

## 2019-03-22 DIAGNOSIS — M545 Low back pain, unspecified: Secondary | ICD-10-CM

## 2019-03-22 DIAGNOSIS — M25651 Stiffness of right hip, not elsewhere classified: Secondary | ICD-10-CM

## 2019-03-22 DIAGNOSIS — M25652 Stiffness of left hip, not elsewhere classified: Secondary | ICD-10-CM

## 2019-03-22 NOTE — Therapy (Signed)
[facility] [address] [place], [address] Phone: [phone]   Fax:  [phone]  Physical Therapy Treatment  Patient Details  Name: Sherry Bernard MRN: [id number] Date of Birth: 1941-03-11 Referring Provider (PT): Nicki Reaper MD   Encounter Date: 03/22/2019  PT End of Session - 03/22/19 1255    Visit Number  3    Number of Visits  13    Date for PT Re-Evaluation  04/26/19    PT Start Time  1100    PT Stop Time  1145    PT Time Calculation (min)  45 min    Activity Tolerance  Patient tolerated treatment well    Behavior During Therapy  [facility] for tasks assessed/performed       Past Medical History:  Diagnosis Date  . Colon polyp   . Hyperthyroidism     Past Surgical History:  Procedure Laterality Date  . APPENDECTOMY     child  . TONSILECTOMY, ADENOIDECTOMY, BILATERAL MYRINGOTOMY AND TUBES     child    There were no vitals filed for this visit.  Subjective Assessment - 03/22/19 1251    Subjective  Patient reports no increase in pain compared to the previous session. Patient states she is worried about doing her exercises incorrectly.     Pertinent History  hyperthyroidism     Limitations  Sitting;Lifting    How long can you sit comfortably?  Hurts with going from sitting to standing - decreases after 10 min of walking     Diagnostic tests  no    Patient Stated Goals  to decrease pain    Currently in Pain?  Yes    Pain Score  5     Pain Location  Back    Pain Orientation  Left    Pain Descriptors / Indicators  Aching    Pain Type  Chronic pain    Pain Onset  More than a month ago    Pain Frequency  Intermittent        TREATMENT Manual therapy STM performed to patient's multifidi B to decrease increased pain and spasms on the affected sides with patient positioned in prone - x 71min  Therapeutic Exercise Straight arm lumbar/hip extension in standing - x20  Hip extension in standing with B  UE support - x 20  Horizontal chops with RTB - x 20 B Seated pelvic tilts - x 20  Hip abduction in standing - x 20  Prone press ups - x 10  Scapular retraction in standing - x 15 B shoulder ER in standing - x 20  Tandem ambulation in standing - 3 x 41ft Shoulder circles in standing - 2 x 20 sec cw/ccw Performed exercises to decrease pain and spasms in standing. Patient demonstrates improvement with pain at the end of the session  PT Education - 03/22/19 1255    Education Details  form/technique with exercise; hip abduction    Person(s) Educated  Patient    Methods  Explanation;Demonstration    Comprehension  Verbalized understanding;Returned demonstration          PT Long Term Goals - 03/15/19 1612      PT LONG TERM GOAL #1   Title  Patient will be independent with HEP to continue benefits of therapy after discharge.    Baseline  Dependent with form/technique    Time  6    Period  Weeks    Status  New  Target Date  04/26/19      PT LONG TERM GOAL #2   Title  Patient will be able to sit for >1 hour without increase in pain to better be able to rest comfortably without increase in pain    Baseline  Increased pain after 5 to 10 min     Time  6    Period  Weeks    Status  New    Target Date  04/26/19      PT LONG TERM GOAL #3   Title  Patient will have a worst pain in the low back to under 3/10 to demonstrate improvement with functional activities.    Baseline  7/10 worst pain    Time  6    Period  Weeks    Status  New    Target Date  04/26/19            Plan - 03/22/19 1256    Clinical Impression Statement  Patient demonstrates decrease in low back pain after performing lumbar extension in standing and performing hip strengthening exercises. Although patient is improving with pain she continues to have decreased lumbar extension and increased pain. Patient will benefit from further skilled therapy to return to prior level of function.     Personal Factors and  Comorbidities  Age;Comorbidity 1    Comorbidities  hyperthyroidism    Examination-Activity Limitations  Bend;Sit    Examination-Participation Restrictions  Community Activity    Stability/Clinical Decision Making  Stable/Uncomplicated    Rehab Potential  Good    PT Frequency  2x / week    PT Duration  6 weeks    PT Treatment/Interventions  Therapeutic exercise;Therapeutic activities;Balance training;Neuromuscular re-education;Cognitive remediation;Gait training;Electrical Stimulation;Moist Heat;Stair training;Patient/family education;Dry needling;Passive range of motion;Joint Manipulations;Spinal Manipulations    PT Next Visit Plan  progress strengthening and AROM based exercises    PT Home Exercise Plan  See education section    Consulted and Agree with Plan of Care  Patient       Patient will benefit from skilled therapeutic intervention in order to improve the following deficits and impairments:  Abnormal gait, Increased fascial restricitons, Pain, Decreased mobility, Decreased coordination, Increased muscle spasms, Decreased activity tolerance, Decreased strength, Decreased range of motion, Decreased endurance, Difficulty walking  Visit Diagnosis: Acute bilateral low back pain without sciatica  Stiffness of right hip, not elsewhere classified  Stiffness of left hip, not elsewhere classified     Problem List Patient Active Problem List   Diagnosis Date Noted  . Finger infection 03/05/2019  . Skin infection 01/04/2019  . URI (upper respiratory infection) 01/04/2019  . Healthcare maintenance 12/05/2018  . Left shoulder pain 03/12/2018  . Anxiety 08/18/2017  . Hematuria 08/16/2017  . Low back pain 01/10/2017  . Encounter for counseling 11/06/2016  . Cough 10/06/2016  . Diarrhea 10/06/2016  . Fibrocystic breast disease 05/05/2016  . Osteopenia 05/05/2016  . Cyst of pancreas 08/13/2015  . 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 Acquired hypothyroidism 08/02/2014  .  Rhytides 11/20/2013  . Postmenopausal 10/13/1991    Blythe Stanford, PT DPT 03/22/2019, 12:58 PM  [facility] [address], [place], [address] Phone: [phone]   Fax:  [phone]  Name: Sherry Bernard MRN: [id number] Date of Birth: 1941-04-30

## 2019-03-28 ENCOUNTER — Other Ambulatory Visit: Payer: Self-pay

## 2019-03-28 ENCOUNTER — Ambulatory Visit: Payer: PPO

## 2019-03-28 DIAGNOSIS — M25651 Stiffness of right hip, not elsewhere classified: Secondary | ICD-10-CM

## 2019-03-28 DIAGNOSIS — M545 Low back pain, unspecified: Secondary | ICD-10-CM

## 2019-03-28 DIAGNOSIS — M25652 Stiffness of left hip, not elsewhere classified: Secondary | ICD-10-CM

## 2019-03-28 NOTE — Therapy (Signed)
[place] [address], [place], [address] Phone: [phone]   Fax:  [phone]  Physical Therapy Treatment  Patient Details  Name: Sherry Bernard MRN: [id number] Date of Birth: [birth year] Referring Provider (PT): Nicki Reaper MD   Encounter Date: 03/28/2019  PT End of Session - 03/28/19 1546    Visit Number  4    Number of Visits  13    Date for PT Re-Evaluation  04/26/19    PT Start Time  1500    PT Stop Time  1545    PT Time Calculation (min)  45 min    Activity Tolerance  Patient tolerated treatment well    Behavior During Therapy  [facility] for tasks assessed/performed       Past Medical History:  Diagnosis Date  . Colon polyp   . Hyperthyroidism     Past Surgical History:  Procedure Laterality Date  . APPENDECTOMY     child  . TONSILECTOMY, ADENOIDECTOMY, BILATERAL MYRINGOTOMY AND TUBES     child    There were no vitals filed for this visit.  Subjective Assessment - 03/28/19 1518    Subjective  Patient reports no change but reports she is having less pain then she has been. Patient states her pain is "less than a 5/10".    Pertinent History  hyperthyroidism     Limitations  Sitting;Lifting    How long can you sit comfortably?  Hurts with going from sitting to standing - decreases after 10 min of walking     Diagnostic tests  no    Patient Stated Goals  to decrease pain    Currently in Pain?  Yes    Pain Score  2     Pain Location  Back    Pain Orientation  Left    Pain Descriptors / Indicators  Aching    Pain Type  Chronic pain    Pain Onset  More than a month ago    Pain Frequency  Intermittent         TREATMENT Manual therapy STM performed to patient's multifidi B to decrease increased pain and spasms on the affected sides with patient positioned in prone - x 45min   Therapeutic Exercise Scapular retraction in standing - x 15 with cervical retraction Lumbar extension with use of  the towel - 3 x 10  Seated pelvic tilts - x 20  Hip abduction in standing - x 20 Paloff press in standing with YTB - x 20  Lumbar rotations in standing with YTB - x20  Performed exercises to decrease pain and spasms in standing. Patient demonstrates improvement with pain at the end of the session   PT Education - 03/28/19 1545    Education Details  form/technique with exercise; lumbar extension with towel    Person(s) Educated  Patient    Methods  Explanation;Demonstration    Comprehension  Verbalized understanding;Returned demonstration          PT Long Term Goals - 03/15/19 1612      PT LONG TERM GOAL #1   Title  Patient will be independent with HEP to continue benefits of therapy after discharge.    Baseline  Dependent with form/technique    Time  6    Period  Weeks    Status  New    Target Date  04/26/19      PT LONG TERM GOAL #2   Title  Patient will be able  to sit for >1 hour without increase in pain to better be able to rest comfortably without increase in pain    Baseline  Increased pain after 5 to 10 min     Time  6    Period  Weeks    Status  New    Target Date  04/26/19      PT LONG TERM GOAL #3   Title  Patient will have a worst pain in the low back to under 3/10 to demonstrate improvement with functional activities.    Baseline  7/10 worst pain    Time  6    Period  Weeks    Status  New    Target Date  04/26/19            Plan - 03/28/19 1546    Clinical Impression Statement  Patient deosntrates decreased low back pain after doing ~5sec of lumbar extension in standing. Educated frequently on performing greater motions into lumbar extension compared to flexion based exercises. Patient states increased lumbar pain with paloff press which is decreased after performing prolonged lumbar extension. Patient will benefit from furhter skilled therapy to return to prior level of function.    Personal Factors and Comorbidities  Age;Comorbidity 1     Comorbidities  hyperthyroidism    Examination-Activity Limitations  Bend;Sit    Examination-Participation Restrictions  Community Activity    Stability/Clinical Decision Making  Stable/Uncomplicated    Rehab Potential  Good    PT Frequency  2x / week    PT Duration  6 weeks    PT Treatment/Interventions  Therapeutic exercise;Therapeutic activities;Balance training;Neuromuscular re-education;Cognitive remediation;Gait training;Electrical Stimulation;Moist Heat;Stair training;Patient/family education;Dry needling;Passive range of motion;Joint Manipulations;Spinal Manipulations    PT Next Visit Plan  progress strengthening and AROM based exercises    PT Home Exercise Plan  See education section    Consulted and Agree with Plan of Care  Patient       Patient will benefit from skilled therapeutic intervention in order to improve the following deficits and impairments:  Abnormal gait, Increased fascial restricitons, Pain, Decreased mobility, Decreased coordination, Increased muscle spasms, Decreased activity tolerance, Decreased strength, Decreased range of motion, Decreased endurance, Difficulty walking  Visit Diagnosis: 1. Acute bilateral low back pain without sciatica   2. Stiffness of right hip, not elsewhere classified   3. Stiffness of left hip, not elsewhere classified        Problem List Patient Active Problem List   Diagnosis Date Noted  . Finger infection 03/05/2019  . Skin infection 01/04/2019  . URI (upper respiratory infection) 01/04/2019  . Healthcare maintenance 12/05/2018  . Left shoulder pain 03/12/2018  . Anxiety 08/18/2017  . Hematuria 08/16/2017  . Low back pain 01/10/2017  . Encounter for counseling 11/06/2016  . Cough 10/06/2016  . Diarrhea 10/06/2016  . Fibrocystic breast disease 05/05/2016  . Osteopenia 05/05/2016  . Cyst of pancreas 08/13/2015  . 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 Acquired hypothyroidism 08/02/2014  . Rhytides 11/20/2013  .  Postmenopausal 10/13/1991    Blythe Stanford 03/28/2019, 3:49 PM  [place] [facility] [address] [place], [address] Phone: [phone]   Fax:  [phone]  Name: Sherry Bernard MRN: [id number] Date of Birth: Sep 08, 1941

## 2019-03-30 ENCOUNTER — Ambulatory Visit: Payer: PPO

## 2019-03-30 ENCOUNTER — Other Ambulatory Visit: Payer: Self-pay

## 2019-03-30 DIAGNOSIS — M25651 Stiffness of right hip, not elsewhere classified: Secondary | ICD-10-CM

## 2019-03-30 DIAGNOSIS — M25652 Stiffness of left hip, not elsewhere classified: Secondary | ICD-10-CM

## 2019-03-30 DIAGNOSIS — M545 Low back pain, unspecified: Secondary | ICD-10-CM

## 2019-03-30 NOTE — Therapy (Signed)
[facility] [facility] [address] [place], [address] Phone: [phone]   Fax:  [phone]  Physical Therapy Treatment  Patient Details  Name: Sherry Bernard MRN: [id number] Date of Birth: [birth year] Referring Provider (PT): Nicki Reaper MD   Encounter Date: 03/30/2019  PT End of Session - 03/30/19 1501    Visit Number  5    Number of Visits  13    Date for PT Re-Evaluation  04/26/19    PT Start Time  1400    PT Stop Time  1445    PT Time Calculation (min)  45 min    Activity Tolerance  Patient tolerated treatment well    Behavior During Therapy  [facility] for tasks assessed/performed       Past Medical History:  Diagnosis Date  . Colon polyp   . Hyperthyroidism     Past Surgical History:  Procedure Laterality Date  . APPENDECTOMY     child  . TONSILECTOMY, ADENOIDECTOMY, BILATERAL MYRINGOTOMY AND TUBES     child    There were no vitals filed for this visit.  Subjective Assessment - 03/30/19 1458    Subjective  Patient states she does not notice any noticable change between sessions. However, she is unable to give a pattern to her pain.    Pertinent History  hyperthyroidism     Limitations  Sitting;Lifting    How long can you sit comfortably?  Hurts with going from sitting to standing - decreases after 10 min of walking     Diagnostic tests  no    Patient Stated Goals  to decrease pain    Currently in Pain?  Yes    Pain Score  4     Pain Location  Back    Pain Orientation  Left    Pain Descriptors / Indicators  Aching    Pain Type  Chronic pain    Pain Onset  More than a month ago    Pain Frequency  Intermittent       TREATMENT Manual therapy STM performed to patient's multifidi B to decrease increased pain and spasms on the affected sides with patient positioned in prone - x 89min   Therapeutic Exercise Lumbar extension with use of the towel - 3 x 10  Seated pelvic tilts - 2 x 20  Prone on elbows - x  20 Paloff press in standing with RTB - x 20  Lumbar rotations in standing with RTB - x20   Performed exercises to decrease pain and spasms in standing. Patient demonstrates improvement with pain at the end of the session   PT Education - 03/30/19 1500    Education Details  form/technique with exercise; multifidus crunch in sitting    Person(s) Educated  Patient    Methods  Explanation;Demonstration    Comprehension  Verbalized understanding;Returned demonstration          PT Long Term Goals - 03/15/19 1612      PT LONG TERM GOAL #1   Title  Patient will be independent with HEP to continue benefits of therapy after discharge.    Baseline  Dependent with form/technique    Time  6    Period  Weeks    Status  New    Target Date  04/26/19      PT LONG TERM GOAL #2   Title  Patient will be able to sit for >1 hour without increase in pain to better be able  to rest comfortably without increase in pain    Baseline  Increased pain after 5 to 10 min     Time  6    Period  Weeks    Status  New    Target Date  04/26/19      PT LONG TERM GOAL #3   Title  Patient will have a worst pain in the low back to under 3/10 to demonstrate improvement with functional activities.    Baseline  7/10 worst pain    Time  6    Period  Weeks    Status  New    Target Date  04/26/19            Plan - 03/30/19 1623    Clinical Impression Statement  Educated patient on the importance of performing lumbar extension after prolonged sitting especially when having increased symptoms. Patient demonstrates improvement after performing hip/lumbar extension in standing. However, patient demosntrates decreased lumbar ext/flexion with pelvic tilts and focused on improvement of mobilization. Patient will benefit from further skilled therapy to return to prior level of function.    Personal Factors and Comorbidities  Age;Comorbidity 1    Comorbidities  hyperthyroidism    Examination-Activity Limitations   Bend;Sit    Examination-Participation Restrictions  Community Activity    Stability/Clinical Decision Making  Stable/Uncomplicated    Rehab Potential  Good    PT Frequency  2x / week    PT Duration  6 weeks    PT Treatment/Interventions  Therapeutic exercise;Therapeutic activities;Balance training;Neuromuscular re-education;Cognitive remediation;Gait training;Electrical Stimulation;Moist Heat;Stair training;Patient/family education;Dry needling;Passive range of motion;Joint Manipulations;Spinal Manipulations    PT Next Visit Plan  progress strengthening and AROM based exercises    PT Home Exercise Plan  See education section    Consulted and Agree with Plan of Care  Patient       Patient will benefit from skilled therapeutic intervention in order to improve the following deficits and impairments:  Abnormal gait, Increased fascial restricitons, Pain, Decreased mobility, Decreased coordination, Increased muscle spasms, Decreased activity tolerance, Decreased strength, Decreased range of motion, Decreased endurance, Difficulty walking  Visit Diagnosis: 1. Acute bilateral low back pain without sciatica   2. Stiffness of right hip, not elsewhere classified   3. Stiffness of left hip, not elsewhere classified        Problem List Patient Active Problem List   Diagnosis Date Noted  . Finger infection 03/05/2019  . Skin infection 01/04/2019  . URI (upper respiratory infection) 01/04/2019  . Healthcare maintenance 12/05/2018  . Left shoulder pain 03/12/2018  . Anxiety 08/18/2017  . Hematuria 08/16/2017  . Low back pain 01/10/2017  . Encounter for counseling 11/06/2016  . Cough 10/06/2016  . Diarrhea 10/06/2016  . Fibrocystic breast disease 05/05/2016  . Osteopenia 05/05/2016  . Cyst of pancreas 08/13/2015  . 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 Acquired hypothyroidism 08/02/2014  . Rhytides 11/20/2013  . Postmenopausal 10/13/1991    Blythe Stanford, PT DPT 03/30/2019, 4:30  PM  [place] [facility] [address] [place], [address] Phone: [phone]   Fax:  [phone]  Name: Sherry Bernard MRN: [id number] Date of Birth: 1941-08-06

## 2019-04-04 ENCOUNTER — Other Ambulatory Visit: Payer: Self-pay

## 2019-04-04 ENCOUNTER — Ambulatory Visit: Payer: PPO

## 2019-04-04 DIAGNOSIS — M25652 Stiffness of left hip, not elsewhere classified: Secondary | ICD-10-CM

## 2019-04-04 DIAGNOSIS — M545 Low back pain, unspecified: Secondary | ICD-10-CM

## 2019-04-04 DIAGNOSIS — M25651 Stiffness of right hip, not elsewhere classified: Secondary | ICD-10-CM

## 2019-04-04 NOTE — Therapy (Signed)
[place] [facility] [address] [place], [address] Phone: [phone]   Fax:  [phone]  Physical Therapy Treatment  Patient Details  Name: Sherry Bernard MRN: [id number] Date of Birth: [birth year] Referring Provider (PT): Nicki Reaper MD   Encounter Date: 04/04/2019  PT End of Session - 04/04/19 1500    Visit Number  6    Number of Visits  13    Date for PT Re-Evaluation  04/26/19    PT Start Time  1400    PT Stop Time  1448    PT Time Calculation (min)  48 min    Activity Tolerance  Patient tolerated treatment well    Behavior During Therapy  [facility] for tasks assessed/performed       Past Medical History:  Diagnosis Date  . Colon polyp   . Hyperthyroidism     Past Surgical History:  Procedure Laterality Date  . APPENDECTOMY     child  . TONSILECTOMY, ADENOIDECTOMY, BILATERAL MYRINGOTOMY AND TUBES     child    There were no vitals filed for this visit.  Subjective Assessment - 04/04/19 1457    Subjective  Patient states no major change in symptoms since the previous session, however states she can better manage her symptoms with performing lumbar extension in standing.    Pertinent History  hyperthyroidism     Limitations  Sitting;Lifting    How long can you sit comfortably?  Hurts with going from sitting to standing - decreases after 10 min of walking     Diagnostic tests  no    Patient Stated Goals  to decrease pain    Currently in Pain?  Yes    Pain Score  2     Pain Location  Back    Pain Orientation  Right    Pain Descriptors / Indicators  Aching    Pain Type  Chronic pain    Pain Onset  More than a month ago    Pain Frequency  Intermittent      TREATMENT Therapeutic Exercise Lumbar extension in standing -- x 10 in standing  Scapular retraction with arms behind the back -- x 20  Multifidus crunch with RTB -- 5 x 20 Multifidus crunch without band -- 3 x 20  Prone on elbows with PA pressure  over L3-5 with deep breaths -- 20 sec holds x 10 Hip extension with YTB -- x 20 Hip abduction with YTB -- x 20    Manual therapy STM performed to patient's multifidi B to decrease increased pain and spasms on the affected sides with patient positioned in prone -x 66min  Patient demonstrates no increase in pain at the end of the session. Increased soreness with manual therapy which subsized as the day progresses    PT Education - 04/04/19 1458    Education Details  Added hip abduction/extension to exercises for home as well as Multifidus crunch and lumbar extension; educated on use of towel roll to improve lumbar extension with sitting in the chair    Person(s) Educated  Patient    Methods  Explanation;Demonstration    Comprehension  Verbalized understanding;Returned demonstration          PT Long Term Goals - 03/15/19 1612      PT LONG TERM GOAL #1   Title  Patient will be independent with HEP to continue benefits of therapy after discharge.    Baseline  Dependent with form/technique  Time  6    Period  Weeks    Status  New    Target Date  04/26/19      PT LONG TERM GOAL #2   Title  Patient will be able to sit for >1 hour without increase in pain to better be able to rest comfortably without increase in pain    Baseline  Increased pain after 5 to 10 min     Time  6    Period  Weeks    Status  New    Target Date  04/26/19      PT LONG TERM GOAL #3   Title  Patient will have a worst pain in the low back to under 3/10 to demonstrate improvement with functional activities.    Baseline  7/10 worst pain    Time  6    Period  Weeks    Status  New    Target Date  04/26/19            Plan - 04/04/19 1500    Clinical Impression Statement  Continued to educate patient on imprortance of mobility based exercise to manage symptoms in sitting. Performed exercises continuously to improve patient's motor control most notably with pelvic ant/post pelvic tilts. Patient able  to perform a greater amount of reps with less verbal cueing by the end of the session. Patient on vacation next week and focused on improving function and motor control to maximize exercise performance and decrease pain and spasms. Patient will benefit from further skilled therapy to return to prior level of function.    Personal Factors and Comorbidities  Age;Comorbidity 1    Comorbidities  hyperthyroidism    Examination-Activity Limitations  Bend;Sit    Examination-Participation Restrictions  Community Activity    Stability/Clinical Decision Making  Stable/Uncomplicated    Rehab Potential  Good    PT Frequency  2x / week    PT Duration  6 weeks    PT Treatment/Interventions  Therapeutic exercise;Therapeutic activities;Balance training;Neuromuscular re-education;Cognitive remediation;Gait training;Electrical Stimulation;Moist Heat;Stair training;Patient/family education;Dry needling;Passive range of motion;Joint Manipulations;Spinal Manipulations    PT Next Visit Plan  progress strengthening and AROM based exercises    PT Home Exercise Plan  See education section    Consulted and Agree with Plan of Care  Patient       Patient will benefit from skilled therapeutic intervention in order to improve the following deficits and impairments:  Abnormal gait, Increased fascial restricitons, Pain, Decreased mobility, Decreased coordination, Increased muscle spasms, Decreased activity tolerance, Decreased strength, Decreased range of motion, Decreased endurance, Difficulty walking  Visit Diagnosis: 1. Acute bilateral low back pain without sciatica   2. Stiffness of right hip, not elsewhere classified   3. Stiffness of left hip, not elsewhere classified        Problem List Patient Active Problem List   Diagnosis Date Noted  . Finger infection 03/05/2019  . Skin infection 01/04/2019  . URI (upper respiratory infection) 01/04/2019  . Healthcare maintenance 12/05/2018  . Left shoulder pain  03/12/2018  . Anxiety 08/18/2017  . Hematuria 08/16/2017  . Low back pain 01/10/2017  . Encounter for counseling 11/06/2016  . Cough 10/06/2016  . Diarrhea 10/06/2016  . Fibrocystic breast disease 05/05/2016  . Osteopenia 05/05/2016  . Cyst of pancreas 08/13/2015  . 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 Acquired hypothyroidism 08/02/2014  . Rhytides 11/20/2013  . Postmenopausal 10/13/1991    Blythe Stanford, PT DPT 04/04/2019, 3:05 PM  [place]  [place] [address] [place], [address] Phone: [phone]   Fax:  [phone]  Name: Sherry Bernard MRN: [id number] Date of Birth: [birth year]

## 2019-04-12 DIAGNOSIS — D225 Melanocytic nevi of trunk: Secondary | ICD-10-CM | POA: Diagnosis not present

## 2019-04-12 DIAGNOSIS — L821 Other seborrheic keratosis: Secondary | ICD-10-CM | POA: Diagnosis not present

## 2019-04-12 DIAGNOSIS — L658 Other specified nonscarring hair loss: Secondary | ICD-10-CM | POA: Diagnosis not present

## 2019-04-12 DIAGNOSIS — D2261 Melanocytic nevi of right upper limb, including shoulder: Secondary | ICD-10-CM | POA: Diagnosis not present

## 2019-04-12 DIAGNOSIS — D2272 Melanocytic nevi of left lower limb, including hip: Secondary | ICD-10-CM | POA: Diagnosis not present

## 2019-04-12 DIAGNOSIS — D2271 Melanocytic nevi of right lower limb, including hip: Secondary | ICD-10-CM | POA: Diagnosis not present

## 2019-04-12 DIAGNOSIS — D2262 Melanocytic nevi of left upper limb, including shoulder: Secondary | ICD-10-CM | POA: Diagnosis not present

## 2019-04-13 ENCOUNTER — Ambulatory Visit: Payer: PPO | Attending: Internal Medicine

## 2019-04-13 ENCOUNTER — Other Ambulatory Visit: Payer: Self-pay

## 2019-04-13 DIAGNOSIS — M545 Low back pain, unspecified: Secondary | ICD-10-CM

## 2019-04-13 DIAGNOSIS — M25652 Stiffness of left hip, not elsewhere classified: Secondary | ICD-10-CM | POA: Insufficient documentation

## 2019-04-13 DIAGNOSIS — M25651 Stiffness of right hip, not elsewhere classified: Secondary | ICD-10-CM | POA: Diagnosis not present

## 2019-04-13 NOTE — Therapy (Signed)
[facility] [address] [place], [address] Phone: [phone]   Fax:  [phone]  Physical Therapy Treatment  Patient Details  Name: Sherry Bernard MRN: [id number] Date of Birth: 10/01/1941 Referring Provider (PT): Nicki Reaper MD   Encounter Date: 04/13/2019  PT End of Session - 04/13/19 1729    Visit Number  7    Number of Visits  13    Date for PT Re-Evaluation  04/26/19    PT Start Time  1700    PT Stop Time  1745    PT Time Calculation (min)  45 min    Activity Tolerance  Patient tolerated treatment well    Behavior During Therapy  [facility] for tasks assessed/performed       Past Medical History:  Diagnosis Date  . Colon polyp   . Hyperthyroidism     Past Surgical History:  Procedure Laterality Date  . APPENDECTOMY     child  . TONSILECTOMY, ADENOIDECTOMY, BILATERAL MYRINGOTOMY AND TUBES     child    There were no vitals filed for this visit.  Subjective Assessment - 04/13/19 1726    Subjective  Patient states the towel behind her back when driving has been helping her pain considerably.    Pertinent History  hyperthyroidism     Limitations  Sitting;Lifting    How long can you sit comfortably?  Hurts with going from sitting to standing - decreases after 10 min of walking     Diagnostic tests  no    Patient Stated Goals  to decrease pain    Currently in Pain?  Yes    Pain Score  2     Pain Location  Back    Pain Orientation  Right    Pain Descriptors / Indicators  Aching    Pain Type  Chronic pain    Pain Onset  More than a month ago    Pain Frequency  Intermittent          TREATMENT Therapeutic Exercise Seated pelvic tilts -- x 25 Standing lumbar extension -- x 20 Standing lateral shift B with lumbar extension -- x 20  Lateral shift in prone with lumbar extension (prone press up) -- x 20 Squats in standing with UE support -- x 25 Lunges walking with intermittent UE support -- x  10B Prone on elbows -- 2 x min  Manual therapy STM performed to patient's multifidi B to decrease increased pain and spasms on the affected sides with patient positioned in prone -x 74min  Exercises performed to improve lumbar extension and decrease pain and spasms along the multifidus musculature. Patient demonstrates no increase in pain at the end of the session   PT Education - 04/13/19 1729    Education Details  Added lumbar extension with lateral shift added to HEP    Person(s) Educated  Patient    Methods  Explanation;Demonstration    Comprehension  Verbalized understanding;Returned demonstration          PT Long Term Goals - 03/15/19 1612      PT LONG TERM GOAL #1   Title  Patient will be independent with HEP to continue benefits of therapy after discharge.    Baseline  Dependent with form/technique    Time  6    Period  Weeks    Status  New    Target Date  04/26/19      PT LONG TERM GOAL #2  Title  Patient will be able to sit for >1 hour without increase in pain to better be able to rest comfortably without increase in pain    Baseline  Increased pain after 5 to 10 min     Time  6    Period  Weeks    Status  New    Target Date  04/26/19      PT LONG TERM GOAL #3   Title  Patient will have a worst pain in the low back to under 3/10 to demonstrate improvement with functional activities.    Baseline  7/10 worst pain    Time  6    Period  Weeks    Status  New    Target Date  04/26/19            Plan - 04/13/19 1731    Clinical Impression Statement  Continued to focus on performing greater amount of extension based exercise as patient continues to have a decrease in pain after performing exercises that focus on repeated extensions. Patient demonstrates less pain overall compared to previous sessions and it able to tolerate higher intensity of exercises (squats and lunges). Patient will benefit from further skilled therapy to return to prior level of  function.    Personal Factors and Comorbidities  Age;Comorbidity 1    Comorbidities  hyperthyroidism    Examination-Activity Limitations  Bend;Sit    Examination-Participation Restrictions  Community Activity    Stability/Clinical Decision Making  Stable/Uncomplicated    Rehab Potential  Good    PT Frequency  2x / week    PT Duration  6 weeks    PT Treatment/Interventions  Therapeutic exercise;Therapeutic activities;Balance training;Neuromuscular re-education;Cognitive remediation;Gait training;Electrical Stimulation;Moist Heat;Stair training;Patient/family education;Dry needling;Passive range of motion;Joint Manipulations;Spinal Manipulations    PT Next Visit Plan  progress strengthening and AROM based exercises    PT Home Exercise Plan  See education section    Consulted and Agree with Plan of Care  Patient       Patient will benefit from skilled therapeutic intervention in order to improve the following deficits and impairments:  Abnormal gait, Increased fascial restricitons, Pain, Decreased mobility, Decreased coordination, Increased muscle spasms, Decreased activity tolerance, Decreased strength, Decreased range of motion, Decreased endurance, Difficulty walking  Visit Diagnosis: 1. Acute bilateral low back pain without sciatica   2. Stiffness of right hip, not elsewhere classified   3. Stiffness of left hip, not elsewhere classified        Problem List Patient Active Problem List   Diagnosis Date Noted  . Finger infection 03/05/2019  . Skin infection 01/04/2019  . URI (upper respiratory infection) 01/04/2019  . Healthcare maintenance 12/05/2018  . Left shoulder pain 03/12/2018  . Anxiety 08/18/2017  . Hematuria 08/16/2017  . Low back pain 01/10/2017  . Encounter for counseling 11/06/2016  . Cough 10/06/2016  . Diarrhea 10/06/2016  . Fibrocystic breast disease 05/05/2016  . Osteopenia 05/05/2016  . Cyst of pancreas 08/13/2015  . Serrated adenoma of colon 11/30/2014   . Acquired hypothyroidism 08/02/2014  . Rhytides 11/20/2013  . Postmenopausal 10/13/1991    Blythe Stanford, PT DPT 04/13/2019, 5:47 PM  [facility] [address] [place], [address] Phone: [phone]   Fax:  [phone]  Name: Sherry Bernard MRN: [id number] Date of Birth: [birth year]

## 2019-04-18 ENCOUNTER — Ambulatory Visit: Payer: PPO

## 2019-04-18 ENCOUNTER — Other Ambulatory Visit: Payer: Self-pay

## 2019-04-18 DIAGNOSIS — M545 Low back pain, unspecified: Secondary | ICD-10-CM

## 2019-04-18 DIAGNOSIS — M25652 Stiffness of left hip, not elsewhere classified: Secondary | ICD-10-CM

## 2019-04-18 DIAGNOSIS — M25651 Stiffness of right hip, not elsewhere classified: Secondary | ICD-10-CM

## 2019-04-18 NOTE — Therapy (Signed)
[facility] [address] [place], [address] Phone: [phone]   Fax:  [phone]  Physical Therapy Treatment  Patient Details  Name: Sherry Bernard MRN: [id number] Date of Birth: 1941/06/12 Referring Provider (PT): Nicki Reaper MD   Encounter Date: 04/18/2019  PT End of Session - 04/18/19 1513    Visit Number  8    Number of Visits  13    Date for PT Re-Evaluation  04/26/19    PT Start Time  1430    PT Stop Time  1515    PT Time Calculation (min)  45 min    Activity Tolerance  Patient tolerated treatment well    Behavior During Therapy  [facility] for tasks assessed/performed       Past Medical History:  Diagnosis Date  . Colon polyp   . Hyperthyroidism     Past Surgical History:  Procedure Laterality Date  . APPENDECTOMY     child  . TONSILECTOMY, ADENOIDECTOMY, BILATERAL MYRINGOTOMY AND TUBES     child    There were no vitals filed for this visit.  Subjective Assessment - 04/18/19 1453    Subjective  Patient states improvement with sitting for long periods of time and is able to stand and walk quicker.    Pertinent History  hyperthyroidism     Limitations  Sitting;Lifting    How long can you sit comfortably?  Hurts with going from sitting to standing - decreases after 10 min of walking     Diagnostic tests  no    Patient Stated Goals  to decrease pain    Currently in Pain?  No/denies    Pain Onset  More than a month ago        TREATMENT Therapeutic Exercise Seated pelvic tilts -- x 25 Standing lateral shift - x 25 Standing lumbar extension -- x 10 against manual  resistance  Chops in standing - x20 6# downward B Squats in standing with arms into flexion at 90 degrees - x 20  Straight arm weighted chops with 2# balls - x 20    Manual therapy STM performed to patient's multifidi B to decrease increased pain and spasms on the affected sides with patient positioned in prone - x 69min    Exercises performed to improve lumbar extension and decrease pain and spasms along the multifidus musculature. Patient demonstrates no increase in pain at the end of the session  PT Education - 04/18/19 1508    Education Details  Lumbar extension with lateral shift and chops added to HEP    Person(s) Educated  Patient    Methods  Explanation;Demonstration    Comprehension  Verbalized understanding;Returned demonstration          PT Long Term Goals - 03/15/19 1612      PT LONG TERM GOAL #1   Title  Patient will be independent with HEP to continue benefits of therapy after discharge.    Baseline  Dependent with form/technique    Time  6    Period  Weeks    Status  New    Target Date  04/26/19      PT LONG TERM GOAL #2   Title  Patient will be able to sit for >1 hour without increase in pain to better be able to rest comfortably without increase in pain    Baseline  Increased pain after 5 to 10 min     Time  6  Period  Weeks    Status  New    Target Date  04/26/19      PT LONG TERM GOAL #3   Title  Patient will have a worst pain in the low back to under 3/10 to demonstrate improvement with functional activities.    Baseline  7/10 worst pain    Time  6    Period  Weeks    Status  New    Target Date  04/26/19            Plan - 04/18/19 1513    Clinical Impression Statement  Patient demonstrates improvement with exercises requiring less cueing to perform pelvic tilts today indicating functional carryover between visits. Although patient is improving she continues to have increased back pain intermittently. Patient will benefit from further skilled therapy focused on decreasing pain and spasms most notably in standing to return to prior level of function.    Personal Factors and Comorbidities  Age;Comorbidity 1    Comorbidities  hyperthyroidism    Examination-Activity Limitations  Bend;Sit    Examination-Participation Restrictions  Community Activity     Stability/Clinical Decision Making  Stable/Uncomplicated    Rehab Potential  Good    PT Frequency  2x / week    PT Duration  6 weeks    PT Treatment/Interventions  Therapeutic exercise;Therapeutic activities;Balance training;Neuromuscular re-education;Cognitive remediation;Gait training;Electrical Stimulation;Moist Heat;Stair training;Patient/family education;Dry needling;Passive range of motion;Joint Manipulations;Spinal Manipulations    PT Next Visit Plan  progress strengthening and AROM based exercises    PT Home Exercise Plan  See education section    Consulted and Agree with Plan of Care  Patient       Patient will benefit from skilled therapeutic intervention in order to improve the following deficits and impairments:  Abnormal gait, Increased fascial restricitons, Pain, Decreased mobility, Decreased coordination, Increased muscle spasms, Decreased activity tolerance, Decreased strength, Decreased range of motion, Decreased endurance, Difficulty walking  Visit Diagnosis: 1. Acute bilateral low back pain without sciatica   2. Stiffness of right hip, not elsewhere classified   3. Stiffness of left hip, not elsewhere classified        Problem List Patient Active Problem List   Diagnosis Date Noted  . Finger infection 03/05/2019  . Skin infection 01/04/2019  . URI (upper respiratory infection) 01/04/2019  . Healthcare maintenance 12/05/2018  . Left shoulder pain 03/12/2018  . Anxiety 08/18/2017  . Hematuria 08/16/2017  . Low back pain 01/10/2017  . Encounter for counseling 11/06/2016  . Cough 10/06/2016  . Diarrhea 10/06/2016  . Fibrocystic breast disease 05/05/2016  . Osteopenia 05/05/2016  . Cyst of pancreas 08/13/2015  . 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 Acquired hypothyroidism 08/02/2014  . Rhytides 11/20/2013  . Postmenopausal 10/13/1991    Blythe Stanford, PT DPT 04/18/2019, 5:07 PM  [facility] [address] [place], [address] Phone: [phone]   Fax:  [phone]  Name: Sherry Bernard MRN: [id number] Date of Birth: 04/05/1941

## 2019-04-20 DIAGNOSIS — H04123 Dry eye syndrome of bilateral lacrimal glands: Secondary | ICD-10-CM | POA: Diagnosis not present

## 2019-04-28 ENCOUNTER — Encounter: Payer: Self-pay | Admitting: Plastic Surgery

## 2019-05-04 ENCOUNTER — Telehealth: Payer: Self-pay | Admitting: Plastic Surgery

## 2019-05-04 NOTE — Telephone Encounter (Signed)

## 2019-05-05 ENCOUNTER — Ambulatory Visit (INDEPENDENT_AMBULATORY_CARE_PROVIDER_SITE_OTHER): Payer: Self-pay | Admitting: Plastic Surgery

## 2019-05-05 ENCOUNTER — Other Ambulatory Visit: Payer: Self-pay

## 2019-05-05 ENCOUNTER — Encounter: Payer: Self-pay | Admitting: Plastic Surgery

## 2019-05-05 VITALS — BP 135/70 | HR 83 | Temp 96.8°F | Ht 63.5 in | Wt 124.8 lb

## 2019-05-05 DIAGNOSIS — Z719 Counseling, unspecified: Secondary | ICD-10-CM

## 2019-05-05 NOTE — Progress Notes (Signed)
Botulinum Toxin Procedure Note  Procedure: Cosmetic botulinum toxin  Pre-operative Diagnosis: Dynamic rhytides  Post-operative Diagnosis: Same  Complications:  None  Brief history: The patient desires botulinum toxin injection of her forehead. I discussed with the patient this proposed procedure of botulinum toxin injections, which is customized depending on the particular needs of the patient. It is performed on facial rhytids as a temporary correction. The alternatives were discussed with the patient. The risks were addressed including bleeding, scarring, infection, damage to deeper structures, asymmetry, and chronic pain, which may occur infrequently after a procedure. The individual's choice to undergo a surgical procedure is based on the comparison of risks to potential benefits. Other risks include unsatisfactory results, brow ptosis, eyelid ptosis, allergic reaction, temporary paralysis, which should go away with time, bruising, blurring disturbances and delayed healing. Botulinum toxin injections do not arrest the aging process or produce permanent tightening of the eyelid.  Operative intervention maybe necessary to maintain the results of a blepharoplasty or botulinum toxin. The patient understands and wishes to proceed. An informed consent was signed and informational brochures given to her prior to the procedure.  Procedure: The area was prepped with alcohol and dried with a clean gauze. Using a clean technique, the botulinum toxin was diluted with 1.25 cc of preservative-free normal saline which was slowly injected with an 18 gauge needle in a tuberculin syringes.  A 32 gauge needles were then used to inject the botulinum toxin. This mixture allow for an aliquot of 5 units per 0.1 cc in each injection site.    Subsequently the mixture was injected in the glabellar and forehead area with preservation of the temporal branch to the lateral eyebrow as well as into each lateral canthal area  beginning from the lateral orbital rim medial to the zygomaticus major in 3 separate areas. A total of 30 Units of botulinum toxin was used. The forehead and glabellar area was injected with care to inject intramuscular only while holding pressure on the supratrochlear vessels in each area during each injection on either side of the medial corrugators. The injection proceeded vertically superiorly to the medial 2/3 of the frontalis muscle and superior 2/3 of the lateral frontalis, again with preservation of the frontal branch.  No complications were noted. Light pressure was held for 5 minutes. She was instructed explicitly in post-operative care.  Botox LOT:  [id number] C2 EXP:  1/23

## 2019-05-09 DIAGNOSIS — K580 Irritable bowel syndrome with diarrhea: Secondary | ICD-10-CM | POA: Diagnosis not present

## 2019-05-09 DIAGNOSIS — R159 Full incontinence of feces: Secondary | ICD-10-CM | POA: Diagnosis not present

## 2019-05-09 DIAGNOSIS — Z86010 Personal history of colonic polyps: Secondary | ICD-10-CM | POA: Diagnosis not present

## 2019-05-09 DIAGNOSIS — K862 Cyst of pancreas: Secondary | ICD-10-CM | POA: Diagnosis not present

## 2019-05-11 DIAGNOSIS — H11441 Conjunctival cysts, right eye: Secondary | ICD-10-CM | POA: Diagnosis not present

## 2019-05-16 ENCOUNTER — Ambulatory Visit: Payer: PPO | Attending: Internal Medicine

## 2019-05-16 ENCOUNTER — Other Ambulatory Visit: Payer: Self-pay

## 2019-05-16 DIAGNOSIS — M545 Low back pain, unspecified: Secondary | ICD-10-CM

## 2019-05-16 DIAGNOSIS — M25651 Stiffness of right hip, not elsewhere classified: Secondary | ICD-10-CM | POA: Diagnosis not present

## 2019-05-16 DIAGNOSIS — M25652 Stiffness of left hip, not elsewhere classified: Secondary | ICD-10-CM | POA: Insufficient documentation

## 2019-05-16 NOTE — Therapy (Signed)
[facility] [address], [place], [address] Phone: [phone]   Fax:  [phone]  Physical Therapy Treatment  Patient Details  Name: Sherry Bernard MRN: [id number] Date of Birth: 05-16-1941 Referring Provider (PT): Nicki Reaper MD   Encounter Date: 05/16/2019  PT End of Session - 05/16/19 1702    Visit Number  9    Number of Visits  13    Date for PT Re-Evaluation  06/13/19    PT Start Time  1600    PT Stop Time  1645    PT Time Calculation (min)  45 min    Activity Tolerance  Patient tolerated treatment well    Behavior During Therapy  [facility] for tasks assessed/performed       Past Medical History:  Diagnosis Date  . Colon polyp   . Hyperthyroidism     Past Surgical History:  Procedure Laterality Date  . APPENDECTOMY     child  . TONSILECTOMY, ADENOIDECTOMY, BILATERAL MYRINGOTOMY AND TUBES     child    There were no vitals filed for this visit.  Subjective Assessment - 05/16/19 1500    Subjective  Patient [place] the beach [place] the [place] and was very stessed the majority of the time. Patient reports since she's been using a towel behind her back and has been having a lot less pain compared to previous sessions.    Pertinent History  hyperthyroidism     Limitations  Sitting;Lifting    How long can you sit comfortably?  Hurts with going from sitting to standing - decreases after 10 min of walking     Diagnostic tests  no    Patient Stated Goals  to decrease pain    Currently in Pain?  No/denies    Pain Onset  More than a month ago       TREATMENT Therapeutic Exercise Lumbar extension in standing - x 20 Lumbar extension with use of a towel in standing - x20 Lumbar lateral shift - x 20 B Standing rotations with PVC - x 20 Seated pelvic tilts -- x 25 Squats with UE support - x 20    Manual therapy STM performed to patient's multifidi B to decrease increased pain and  spasms on the affected sides with patient positioned in prone - x 85min   Exercises performed to improve lumbar extension and decrease pain and spasms along the multifidus musculature. Patient demonstrates no increase in pain at the end of the session  PT Education - 05/16/19 1701    Education Details  Reiterated HEP; form/technique with exercise    Person(s) Educated  Patient    Methods  Explanation;Demonstration    Comprehension  Verbalized understanding;Returned demonstration          PT Long Term Goals - 05/16/19 1703      PT LONG TERM GOAL #1   Title  Patient will be independent with HEP to continue benefits of therapy after discharge.    Baseline  Dependent with form/technique; Requires significant cueing with exercise performance    Time  6    Period  Weeks    Status  On-going      PT LONG TERM GOAL #2   Title  Patient will be able to sit for >1 hour without increase in pain to better be able to rest comfortably without increase in pain    Baseline  Increased pain after 5 to 10  min; 05/16/2019: Able to sit for 3 hours    Time  6    Period  Weeks    Status  Achieved      PT LONG TERM GOAL #3   Title  Patient will have a worst pain in the low back to under 3/10 to demonstrate improvement with functional activities.    Baseline  7/10 worst pain; 05/16/2019: 5/10    Time  6    Period  Weeks    Status  On-going            Plan - 05/16/19 1708    Clinical Impression Statement  Patient has been making improvements with ability to sit for longer periods of time with less pain overall. Patient is improving overall however, continues to have increased low back pain most notably with standing and bending. Patient demonstrates overall improvements but continues to have limitations in motor control and strength; patient will benefit from furhter skilled therapy to return to prior level of function.    Personal Factors and Comorbidities  Age;Comorbidity 1    Comorbidities   hyperthyroidism    Examination-Activity Limitations  Bend;Sit    Examination-Participation Restrictions  Community Activity    Stability/Clinical Decision Making  Stable/Uncomplicated    Rehab Potential  Good    PT Frequency  2x / week    PT Duration  6 weeks    PT Treatment/Interventions  Therapeutic exercise;Therapeutic activities;Balance training;Neuromuscular re-education;Cognitive remediation;Gait training;Electrical Stimulation;Moist Heat;Stair training;Patient/family education;Dry needling;Passive range of motion;Joint Manipulations;Spinal Manipulations    PT Next Visit Plan  progress strengthening and AROM based exercises    PT Home Exercise Plan  See education section    Consulted and Agree with Plan of Care  Patient       Patient will benefit from skilled therapeutic intervention in order to improve the following deficits and impairments:  Abnormal gait, Increased fascial restricitons, Pain, Decreased mobility, Decreased coordination, Increased muscle spasms, Decreased activity tolerance, Decreased strength, Decreased range of motion, Decreased endurance, Difficulty walking  Visit Diagnosis: 1. Acute bilateral low back pain without sciatica   2. Stiffness of right hip, not elsewhere classified   3. Stiffness of left hip, not elsewhere classified        Problem List Patient Active Problem List   Diagnosis Date Noted  . Finger infection 03/05/2019  . Skin infection 01/04/2019  . URI (upper respiratory infection) 01/04/2019  . Healthcare maintenance 12/05/2018  . Left shoulder pain 03/12/2018  . Anxiety 08/18/2017  . Hematuria 08/16/2017  . Low back pain 01/10/2017  . Encounter for counseling 11/06/2016  . Cough 10/06/2016  . Diarrhea 10/06/2016  . Fibrocystic breast disease 05/05/2016  . Osteopenia 05/05/2016  . Cyst of pancreas 08/13/2015  . 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 Acquired hypothyroidism 08/02/2014  . Rhytides 11/20/2013  . Postmenopausal  10/13/1991    Blythe Stanford, PT DPT 05/16/2019, 5:16 PM  [facility] [address] [place], [address] Phone: [phone]   Fax:  [phone]  Name: RAYVIN ABID MRN: [id number] Date of Birth: June 11, 1941

## 2019-05-16 NOTE — Addendum Note (Signed)
Addended by: Blain Pais on: 05/16/2019 05:18 PM   Modules accepted: Orders

## 2019-05-18 ENCOUNTER — Other Ambulatory Visit: Payer: Self-pay

## 2019-05-18 ENCOUNTER — Ambulatory Visit: Payer: PPO

## 2019-05-18 DIAGNOSIS — M25652 Stiffness of left hip, not elsewhere classified: Secondary | ICD-10-CM

## 2019-05-18 DIAGNOSIS — M545 Low back pain, unspecified: Secondary | ICD-10-CM

## 2019-05-18 DIAGNOSIS — H11441 Conjunctival cysts, right eye: Secondary | ICD-10-CM | POA: Diagnosis not present

## 2019-05-18 DIAGNOSIS — M25651 Stiffness of right hip, not elsewhere classified: Secondary | ICD-10-CM

## 2019-05-18 NOTE — Therapy (Signed)
[name] [address] [place], [address] Phone: [phone]   Fax:  [phone]  Physical Therapy Treatment  Patient Details  Name: Sherry Bernard MRN: [id number] Date of Birth: 1941/09/02 Referring Provider (PT): Nicki Reaper MD   Encounter Date: 05/18/2019  PT End of Session - 05/18/19 1723    Visit Number  10    Number of Visits  13    Date for PT Re-Evaluation  06/13/19    Authorization Type  2/10    PT Start Time  1700    PT Stop Time  1745    PT Time Calculation (min)  45 min    Activity Tolerance  Patient tolerated treatment well    Behavior During Therapy  [facility] for tasks assessed/performed       Past Medical History:  Diagnosis Date  . Colon polyp   . Hyperthyroidism     Past Surgical History:  Procedure Laterality Date  . APPENDECTOMY     child  . TONSILECTOMY, ADENOIDECTOMY, BILATERAL MYRINGOTOMY AND TUBES     child    There were no vitals filed for this visit.  Subjective Assessment - 05/18/19 1705    Subjective  Patient reports she feels like she is improving throughout her low back and L shoulder. Patient reports she continues to have difficulty with performing some of the exercises.    Pertinent History  hyperthyroidism     Limitations  Sitting;Lifting    How long can you sit comfortably?  Hurts with going from sitting to standing - decreases after 10 min of walking     Diagnostic tests  no    Patient Stated Goals  to decrease pain    Currently in Pain?  No/denies    Pain Onset  More than a month ago         TREATMENT Therapeutic Exercise Lumbar extension in standing - x 20 Lumbar extension with use of a towel in standing - x20 Seated pelvic tilts -- x 25 Lumbar lateral shift - x 20 B Standing rotations with PVC - x 20 Squats with UE support - x 20  Side stepping with RTB around knees - 5 x 70ft Forward monster walks in standing with RTB around knee - 2 x 20 Hip abduction  circles  with RTB - 2 x 20 in each direction Overhead lumbar extension - 2# x 30    Manual therapy STM performed to patient's multifidi B to decrease increased pain and spasms on the affected sides with patient positioned in prone - x 70min   Exercises performed to improve lumbar extension and decrease pain and spasms along the multifidus musculature. Patient demonstrates no increase in pain at the end of the session  PT Education - 05/18/19 1715    Education Details  form/technique with exercise    Person(s) Educated  Patient    Methods  Explanation;Demonstration    Comprehension  Verbalized understanding;Returned demonstration          PT Long Term Goals - 05/16/19 1703      PT LONG TERM GOAL #1   Title  Patient will be independent with HEP to continue benefits of therapy after discharge.    Baseline  Dependent with form/technique; Requires significant cueing with exercise performance    Time  6    Period  Weeks    Status  On-going      PT LONG TERM GOAL #2   Title  Patient  will be able to sit for >1 hour without increase in pain to better be able to rest comfortably without increase in pain    Baseline  Increased pain after 5 to 10 min; 05/16/2019: Able to sit for 3 hours    Time  6    Period  Weeks    Status  Achieved      PT LONG TERM GOAL #3   Title  Patient will have a worst pain in the low back to under 3/10 to demonstrate improvement with functional activities.    Baseline  7/10 worst pain; 05/16/2019: 5/10    Time  6    Period  Weeks    Status  On-going            Plan - 05/18/19 1724    Clinical Impression Statement  Performed greater amount of exercises in standing today and focused on strengthening her core and hip musculature to improve her ability to perform a greater amount of bending and squatting without increase in pain. Patient continues to demonstrate increased muscular spasms and pain along her multifidi, but is having less pain overall with  performance. Patient is improving overall and will benefit from further skilled therapy to return to prior level of function.    Personal Factors and Comorbidities  Age;Comorbidity 1    Comorbidities  hyperthyroidism    Examination-Activity Limitations  Bend;Sit    Examination-Participation Restrictions  Community Activity    Stability/Clinical Decision Making  Stable/Uncomplicated    Rehab Potential  Good    PT Frequency  2x / week    PT Duration  6 weeks    PT Treatment/Interventions  Therapeutic exercise;Therapeutic activities;Balance training;Neuromuscular re-education;Cognitive remediation;Gait training;Electrical Stimulation;Moist Heat;Stair training;Patient/family education;Dry needling;Passive range of motion;Joint Manipulations;Spinal Manipulations    PT Next Visit Plan  progress strengthening and AROM based exercises    PT Home Exercise Plan  See education section    Consulted and Agree with Plan of Care  Patient       Patient will benefit from skilled therapeutic intervention in order to improve the following deficits and impairments:  Abnormal gait, Increased fascial restricitons, Pain, Decreased mobility, Decreased coordination, Increased muscle spasms, Decreased activity tolerance, Decreased strength, Decreased range of motion, Decreased endurance, Difficulty walking  Visit Diagnosis: 1. Acute bilateral low back pain without sciatica   2. Stiffness of right hip, not elsewhere classified   3. Stiffness of left hip, not elsewhere classified        Problem List Patient Active Problem List   Diagnosis Date Noted  . Finger infection 03/05/2019  . Skin infection 01/04/2019  . URI (upper respiratory infection) 01/04/2019  . Healthcare maintenance 12/05/2018  . Left shoulder pain 03/12/2018  . Anxiety 08/18/2017  . Hematuria 08/16/2017  . Low back pain 01/10/2017  . Encounter for counseling 11/06/2016  . Cough 10/06/2016  . Diarrhea 10/06/2016  . Fibrocystic breast  disease 05/05/2016  . Osteopenia 05/05/2016  . Cyst of pancreas 08/13/2015  . 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 Acquired hypothyroidism 08/02/2014  . Rhytides 11/20/2013  . Postmenopausal 10/13/1991    Blythe Stanford, PT DPT 05/18/2019, 5:48 PM  [facility] [address], [place], [address] Phone: [phone]   Fax:  [phone]  Name: Sherry Bernard MRN: [id number] Date of Birth: 27-Oct-1940

## 2019-05-23 ENCOUNTER — Other Ambulatory Visit: Payer: Self-pay

## 2019-05-23 ENCOUNTER — Ambulatory Visit: Payer: PPO

## 2019-05-23 DIAGNOSIS — M25651 Stiffness of right hip, not elsewhere classified: Secondary | ICD-10-CM

## 2019-05-23 DIAGNOSIS — M25652 Stiffness of left hip, not elsewhere classified: Secondary | ICD-10-CM

## 2019-05-23 DIAGNOSIS — M545 Low back pain, unspecified: Secondary | ICD-10-CM

## 2019-05-23 NOTE — Therapy (Signed)
[facility] [address], [place], [address] Phone: [phone]   Fax:  [phone]  Physical Therapy Treatment  Patient Details  Name: Sherry Bernard MRN: [id number] Date of Birth: September 08, 1941 Referring Provider (PT): Nicki Reaper MD   Encounter Date: 05/23/2019  PT End of Session - 05/23/19 1354    Visit Number  11    Number of Visits  13    Date for PT Re-Evaluation  06/13/19    Authorization Type  2/10    PT Start Time  1300    PT Stop Time  1345    PT Time Calculation (min)  45 min    Activity Tolerance  Patient tolerated treatment well;No increased pain    Behavior During Therapy  WFL for tasks assessed/performed       Past Medical History:  Diagnosis Date   Colon polyp    Hyperthyroidism     Past Surgical History:  Procedure Laterality Date   APPENDECTOMY     child   TONSILECTOMY, ADENOIDECTOMY, BILATERAL MYRINGOTOMY AND TUBES     child    There were no vitals filed for this visit.  Subjective Assessment - 05/23/19 1308    Subjective  Pt reports that her low back pain and L shoulder soreness has remained unchanged recently. Pt reports being moderately sore from exercise earlier today. Pt reports little to no difficulty with her HEP.    Pertinent History  hyperthyroidism     Limitations  Sitting;Lifting    How long can you sit comfortably?  Hurts with going from sitting to standing - decreases after 10 min of walking     Diagnostic tests  no    Patient Stated Goals  to decrease pain    Currently in Pain?  No/denies    Pain Score  0-No pain    Pain Onset  More than a month ago       TREATMENT Therapeutic Exercise Lumbar extension in standing - x 20 Lumbar extension with use of a towel in standing - x20 Seated pelvic tilts on white Theraball -- x 25 Seated marching with posterior pelvic tilt on white Theraball x20, add 2 lb ball with shoulder flexion x20 Lumbar lateral shift - x 20  B Standing rotations with PVC - x 20 Squats with UE support - x 20, 3 second hold Navigating 4 6-inch stairs with right rail, reciprocal pattern x4 Forward step-downs at 8-inch step x20 each LE Side stepping with GTB around knees - 2 x 75ft Chop diagonals - 2# med ball, x20 each side   Manual therapy STM performed to patients multifidi B to decrease increased pain and spasms on the affected sides with patient positioned in prone - x 8 min   Exercises performed to improve lumbar extension and decrease pain and spasms along the multifidus musculature, as well as improve hip strength. Patient demonstrates no increase in pain at the end of the session     PT Education - 05/23/19 1353    Education Details  Pt educated on technique/form. HEP to continue as is.    Person(s) Educated  Patient    Methods  Explanation;Demonstration;Tactile cues;Verbal cues    Comprehension  Verbalized understanding;Returned demonstration;Verbal cues required          PT Long Term Goals - 05/16/19 1703      PT LONG TERM GOAL #1   Title  Patient will be independent with HEP to continue benefits of therapy  after discharge.    Baseline  Dependent with form/technique; Requires significant cueing with exercise performance    Time  6    Period  Weeks    Status  On-going      PT LONG TERM GOAL #2   Title  Patient will be able to sit for >1 hour without increase in pain to better be able to rest comfortably without increase in pain    Baseline  Increased pain after 5 to 10 min; 05/16/2019: Able to sit for 3 hours    Time  6    Period  Weeks    Status  Achieved      PT LONG TERM GOAL #3   Title  Patient will have a worst pain in the low back to under 3/10 to demonstrate improvement with functional activities.    Baseline  7/10 worst pain; 05/16/2019: 5/10    Time  6    Period  Weeks    Status  On-going            Plan - 05/23/19 1358    Clinical Impression Statement  Pt continues to perform  increased volume and intensity of exercise, focusing on hip and core strengthening, without any increase in pain. Pt able to tolerate progression of exercise to involve 2# med ball and progression of RTB to GTB, to indicate improved overall core and hip strength. Pt able to end the session without verbalization or demonstration of exacerbation of sx. Pt continues to respond well to manual therapy interventions aimed to reduce muscle spasms at B multifidi at the end of the session. Pt continues to improve but will benefit from further skilled therapy treatment to return to prior level of function.    Personal Factors and Comorbidities  Age;Comorbidity 1    Comorbidities  hyperthyroidism    Examination-Activity Limitations  Bend;Sit    Examination-Participation Restrictions  Community Activity    Stability/Clinical Decision Making  Stable/Uncomplicated    Rehab Potential  Good    PT Frequency  2x / week    PT Duration  6 weeks    PT Treatment/Interventions  Therapeutic exercise;Therapeutic activities;Balance training;Neuromuscular re-education;Cognitive remediation;Gait training;Electrical Stimulation;Moist Heat;Stair training;Patient/family education;Dry needling;Passive range of motion;Joint Manipulations;Spinal Manipulations    PT Next Visit Plan  progress strengthening and AROM based exercises    PT Home Exercise Plan  See education section    Consulted and Agree with Plan of Care  Patient       Patient will benefit from skilled therapeutic intervention in order to improve the following deficits and impairments:  Abnormal gait, Increased fascial restricitons, Pain, Decreased mobility, Decreased coordination, Increased muscle spasms, Decreased activity tolerance, Decreased strength, Decreased range of motion, Decreased endurance, Difficulty walking  Visit Diagnosis: 1. Acute bilateral low back pain without sciatica   2. Stiffness of right hip, not elsewhere classified   3. Stiffness of left  hip, not elsewhere classified        Problem List Patient Active Problem List   Diagnosis Date Noted   Finger infection 03/05/2019   Skin infection 01/04/2019   URI (upper respiratory infection) 01/04/2019   Healthcare maintenance 12/05/2018   Left shoulder pain 03/12/2018   Anxiety 08/18/2017   Hematuria 08/16/2017   Low back pain 01/10/2017   Encounter for counseling 11/06/2016   Cough 10/06/2016   Diarrhea 10/06/2016   Fibrocystic breast disease 05/05/2016   Osteopenia 05/05/2016   Cyst of pancreas 08/13/2015   Serrated adenoma of colon 11/30/2014   Acquired  hypothyroidism 08/02/2014   Rhytides 11/20/2013   Postmenopausal 10/13/1991    Scarlette Calico, SPT 05/23/2019, 1:59 PM  [facility] [address], [place], [address] Phone: [phone]   Fax:  [phone]  Name: Sherry Bernard MRN: [id number] Date of Birth: 1941/05/31

## 2019-05-25 ENCOUNTER — Other Ambulatory Visit: Payer: Self-pay

## 2019-05-25 ENCOUNTER — Ambulatory Visit: Payer: PPO

## 2019-05-25 DIAGNOSIS — M545 Low back pain, unspecified: Secondary | ICD-10-CM

## 2019-05-25 DIAGNOSIS — M25651 Stiffness of right hip, not elsewhere classified: Secondary | ICD-10-CM

## 2019-05-25 DIAGNOSIS — M25652 Stiffness of left hip, not elsewhere classified: Secondary | ICD-10-CM

## 2019-05-25 NOTE — Therapy (Signed)
[facility] [address] [place], [address] Phone: [phone]   Fax:  [phone]  Physical Therapy Treatment  Patient Details  Name: Sherry Bernard MRN: [id number] Date of Birth: 07-23-1941 Referring Provider (PT): Nicki Reaper MD   Encounter Date: 05/25/2019  PT End of Session - 05/25/19 1552    Visit Number  12    Number of Visits  13    Date for PT Re-Evaluation  06/13/19    Authorization Type  2/10    PT Start Time  1500    PT Stop Time  1545    PT Time Calculation (min)  45 min    Activity Tolerance  Patient tolerated treatment well;No increased pain    Behavior During Therapy  WFL for tasks assessed/performed       Past Medical History:  Diagnosis Date   Colon polyp    Hyperthyroidism     Past Surgical History:  Procedure Laterality Date   APPENDECTOMY     child   TONSILECTOMY, ADENOIDECTOMY, BILATERAL MYRINGOTOMY AND TUBES     child    There were no vitals filed for this visit.  Subjective Assessment - 05/25/19 1503    Subjective  Pt reports a current and improved 0/10 back pain. Pt was able to workout this morning and her shoulders feel stronger. Pt states no major changes since last session.    Pertinent History  hyperthyroidism     Limitations  Sitting;Lifting    How long can you sit comfortably?  Hurts with going from sitting to standing - decreases after 10 min of walking     Diagnostic tests  no    Patient Stated Goals  to decrease pain    Currently in Pain?  No/denies    Pain Score  0-No pain    Pain Onset  More than a month ago       TREATMENT Therapeutic Exercise Lumbar extension with use of a towel in standing - x20 Seated marching with posterior pelvic tilt on white Theraball x20, 6# with shoulder flexion x20 Lumbar lateral shift - x 20 B Standing rotations with PVC - x 20 Squats with UE support - x 20, 2-3 second hold Forward step-downs at 8-inch step x20 each  LE Forward step-up/down at 7-inch stairs (x4) with R rail - x6 Forward monster walks with GTB around ankles - 4 x 15 ft Side stepping with GTB around ankles - 4 x 24ft Chop diagonals - 3.3# med ball, x10 each side Chop diagonals - 6.6# med ball, x10 each side   Manual therapy STM performed to patients multifidi B to decrease increased pain and spasms on the affected sides with patient positioned in prone - x 8 min   Exercises performed to improve lumbar extension and decrease pain and spasms along the multifidus musculature, as well as improve hip strength. Patient demonstrates no increase in pain at the end of the session   PT Education - 05/25/19 1551    Education Details  Pt educated on technique/form. HEP to continue as is.    Person(s) Educated  Patient    Methods  Explanation;Demonstration;Tactile cues;Verbal cues    Comprehension  Verbalized understanding;Returned demonstration          PT Long Term Goals - 05/16/19 1703      PT LONG TERM GOAL #1   Title  Patient will be independent with HEP to continue benefits of therapy after discharge.  Baseline  Dependent with form/technique; Requires significant cueing with exercise performance    Time  6    Period  Weeks    Status  On-going      PT LONG TERM GOAL #2   Title  Patient will be able to sit for >1 hour without increase in pain to better be able to rest comfortably without increase in pain    Baseline  Increased pain after 5 to 10 min; 05/16/2019: Able to sit for 3 hours    Time  6    Period  Weeks    Status  Achieved      PT LONG TERM GOAL #3   Title  Patient will have a worst pain in the low back to under 3/10 to demonstrate improvement with functional activities.    Baseline  7/10 worst pain; 05/16/2019: 5/10    Time  6    Period  Weeks    Status  On-going            Plan - 05/25/19 1555    Clinical Impression Statement  Pt continues to perform increased volume and intensity of exercise, focusing on  hip and core strengthening, without any increase in pain and with decreased difficulty. Pt able to tolerate progression of exercise to involve 6.6# med ball and continued use of GTB for hip strengthening, to indicate improved overall core and hip strength and activity tolerance. Pts technique with navigating 7-8-inch step/stairs is improved with intermittent verbal cueing, as well as her squat technique and depth has improved with improved hip activation/hip hinge form. Pt continues to be able to end the session without verbalization or demonstration of exacerbation of sx. Pt continues to respond well to manual therapy interventions aimed to reduce muscle spasms at B multifidi (R>L) at the end of the session. Pt continues to improve but will benefit from further skilled therapy treatment to return to prior level of function.    Personal Factors and Comorbidities  Age;Comorbidity 1    Comorbidities  hyperthyroidism    Examination-Activity Limitations  Bend;Sit    Examination-Participation Restrictions  Community Activity    Stability/Clinical Decision Making  Stable/Uncomplicated    Rehab Potential  Good    PT Frequency  2x / week    PT Duration  6 weeks    PT Treatment/Interventions  Therapeutic exercise;Therapeutic activities;Balance training;Neuromuscular re-education;Cognitive remediation;Gait training;Electrical Stimulation;Moist Heat;Stair training;Patient/family education;Dry needling;Passive range of motion;Joint Manipulations;Spinal Manipulations    PT Next Visit Plan  progress strengthening and AROM based exercises    PT Home Exercise Plan  See education section    Consulted and Agree with Plan of Care  Patient       Patient will benefit from skilled therapeutic intervention in order to improve the following deficits and impairments:  Abnormal gait, Increased fascial restricitons, Pain, Decreased mobility, Decreased coordination, Increased muscle spasms, Decreased activity tolerance,  Decreased strength, Decreased range of motion, Decreased endurance, Difficulty walking  Visit Diagnosis: 1. Acute bilateral low back pain without sciatica   2. Stiffness of right hip, not elsewhere classified   3. Stiffness of left hip, not elsewhere classified        Problem List Patient Active Problem List   Diagnosis Date Noted   Finger infection 03/05/2019   Skin infection 01/04/2019   URI (upper respiratory infection) 01/04/2019   Healthcare maintenance 12/05/2018   Left shoulder pain 03/12/2018   Anxiety 08/18/2017   Hematuria 08/16/2017   Low back pain 01/10/2017   Encounter for  counseling 11/06/2016   Cough 10/06/2016   Diarrhea 10/06/2016   Fibrocystic breast disease 05/05/2016   Osteopenia 05/05/2016   Cyst of pancreas 08/13/2015   Serrated adenoma of colon 11/30/2014   Acquired hypothyroidism 08/02/2014   Rhytides 11/20/2013   Postmenopausal 10/13/1991    Sherry Bernard, SPT 05/25/2019, 3:56 PM  [facility] [address] [place], [address] Phone: [phone]   Fax:  [phone]  Name: Sherry Bernard MRN: [id number] Date of Birth: December 29, 1940

## 2019-05-29 ENCOUNTER — Other Ambulatory Visit: Payer: Self-pay

## 2019-05-29 ENCOUNTER — Ambulatory Visit: Payer: PPO

## 2019-05-29 DIAGNOSIS — M545 Low back pain, unspecified: Secondary | ICD-10-CM

## 2019-05-29 DIAGNOSIS — M25652 Stiffness of left hip, not elsewhere classified: Secondary | ICD-10-CM

## 2019-05-29 DIAGNOSIS — M25651 Stiffness of right hip, not elsewhere classified: Secondary | ICD-10-CM

## 2019-05-29 NOTE — Therapy (Signed)
[facility] [address], [place], [address] Phone: [phone]   Fax:  [phone]  Physical Therapy Treatment  Patient Details  Name: Sherry Bernard MRN: [id number] Date of Birth: 09-Sep-1941 Referring Provider (PT): Nicki Reaper MD   Encounter Date: 05/29/2019  PT End of Session - 05/29/19 1729    Visit Number  13    Number of Visits  13    Date for PT Re-Evaluation  06/13/19    Authorization Type  4/10    PT Start Time  1430    PT Stop Time  1515    PT Time Calculation (min)  45 min    Activity Tolerance  Patient tolerated treatment well;No increased pain    Behavior During Therapy  WFL for tasks assessed/performed       Past Medical History:  Diagnosis Date   Colon polyp    Hyperthyroidism     Past Surgical History:  Procedure Laterality Date   APPENDECTOMY     child   TONSILECTOMY, ADENOIDECTOMY, BILATERAL MYRINGOTOMY AND TUBES     child    There were no vitals filed for this visit.  Subjective Assessment - 05/29/19 1710    Subjective  Pt reports a continued 0/10 pain that has improved since initial onset of therapy. Pt wishes to discuss possible d/c with a strong HEP. She states that she has been without sx or increase in pain for a few weeks.    Pertinent History  hyperthyroidism     Limitations  Sitting;Lifting    How long can you sit comfortably?  Hurts with going from sitting to standing - decreases after 10 min of walking     Diagnostic tests  no    Patient Stated Goals  to decrease pain    Currently in Pain?  No/denies    Pain Score  0-No pain    Pain Onset  More than a month ago         TREATMENT Therapeutic Exercise Lumbar extension with use of a towel in standing - x20 Seated marching with posterior pelvic tilt on white Theraball x20, 6# with shoulder flexion x20 Lumbar lateral shift - x 20 B Standing rotations with PVC - x 20 Squats with UE support - x 20, 2-3 second  hold Forward monster walks with GTB around ankles - 4 x 15 ft Side stepping with GTB around ankles - 4 x 12ft Chop diagonals - 6.6# med ball, x10 each side   Manual therapy STM performed to patients multifidi B to decrease increased pain and spasms on the affected sides with patient positioned in prone - x 8 min   Exercises performed to improve lumbar extension and decrease pain and spasms along the multifidus musculature, as well as improve hip strength. Patient demonstrates no increase in pain at the end of the session          PT Education - 05/29/19 1726    Education Details  Pt educated on technique/form. HEP given via printout of medbridge program (confirmation [id number]).    Person(s) Educated  Patient    Methods  Explanation;Demonstration;Tactile cues;Verbal cues;Handout    Comprehension  Verbalized understanding;Returned demonstration          PT Long Term Goals - 05/16/19 1703      PT LONG TERM GOAL #1   Title  Patient will be independent with HEP to continue benefits of therapy after discharge.    Baseline  Dependent with  form/technique; Requires significant cueing with exercise performance    Time  6    Period  Weeks    Status  On-going      PT LONG TERM GOAL #2   Title  Patient will be able to sit for >1 hour without increase in pain to better be able to rest comfortably without increase in pain    Baseline  Increased pain after 5 to 10 min; 05/16/2019: Able to sit for 3 hours    Time  6    Period  Weeks    Status  Achieved      PT LONG TERM GOAL #3   Title  Patient will have a worst pain in the low back to under 3/10 to demonstrate improvement with functional activities.    Baseline  7/10 worst pain; 05/16/2019: 5/10    Time  6    Period  Weeks    Status  On-going            Plan - 05/29/19 1742    Clinical Impression Statement  Pt continues to perform increased volume and intensity of exercise, focusing on hip and core strengthening, without any  increase in pain. Pt able to perform all exercises with greater independence and decreased requirements for verbal/tactile cueing for technique. Pt continues to demonstrate improved hip and core strength, as indicated by continued carryover of good technique and decreased observed muscular fatigue/lack of form breakdown. Pt continues to be able to end the session without verbalization or demonstration of exacerbation of sx. At the end of the session, pt could not localize or report any muscular spasms or increased tension in the lower back/multifidi region, indicating improved tissue tolerance and lack of sx with increased activity. Pt continues to improve and will benefit from at least one more session from further skilled therapy treatment to finalize her HEP for possible d/c.    Personal Factors and Comorbidities  Age;Comorbidity 1    Comorbidities  hyperthyroidism    Examination-Activity Limitations  Bend;Sit    Examination-Participation Restrictions  Community Activity    Stability/Clinical Decision Making  Stable/Uncomplicated    Rehab Potential  Good    PT Frequency  2x / week    PT Duration  6 weeks    PT Treatment/Interventions  Therapeutic exercise;Therapeutic activities;Balance training;Neuromuscular re-education;Cognitive remediation;Gait training;Electrical Stimulation;Moist Heat;Stair training;Patient/family education;Dry needling;Passive range of motion;Joint Manipulations;Spinal Manipulations    PT Next Visit Plan  progress strengthening and AROM based exercises    PT Home Exercise Plan  See education section    Consulted and Agree with Plan of Care  Patient       Patient will benefit from skilled therapeutic intervention in order to improve the following deficits and impairments:  Abnormal gait, Increased fascial restricitons, Pain, Decreased mobility, Decreased coordination, Increased muscle spasms, Decreased activity tolerance, Decreased strength, Decreased range of motion,  Decreased endurance, Difficulty walking  Visit Diagnosis: 1. Acute bilateral low back pain without sciatica   2. Stiffness of right hip, not elsewhere classified   3. Stiffness of left hip, not elsewhere classified        Problem List Patient Active Problem List   Diagnosis Date Noted   Finger infection 03/05/2019   Skin infection 01/04/2019   URI (upper respiratory infection) 01/04/2019   Healthcare maintenance 12/05/2018   Left shoulder pain 03/12/2018   Anxiety 08/18/2017   Hematuria 08/16/2017   Low back pain 01/10/2017   Encounter for counseling 11/06/2016   Cough 10/06/2016   Diarrhea  10/06/2016   Fibrocystic breast disease 05/05/2016   Osteopenia 05/05/2016   Cyst of pancreas 08/13/2015   Serrated adenoma of colon 11/30/2014   Acquired hypothyroidism 08/02/2014   Rhytides 11/20/2013   Postmenopausal 10/13/1991    Scarlette Calico, SPT 05/29/2019, 5:45 PM  [facility] [address], [place], [address] Phone: [phone]   Fax:  [phone]  Name: CONSUELO SUTHERS MRN: [id number] Date of Birth: Jun 24, 1941

## 2019-05-30 DIAGNOSIS — H11441 Conjunctival cysts, right eye: Secondary | ICD-10-CM | POA: Diagnosis not present

## 2019-05-31 DIAGNOSIS — Z719 Counseling, unspecified: Secondary | ICD-10-CM

## 2019-06-06 DIAGNOSIS — H1045 Other chronic allergic conjunctivitis: Secondary | ICD-10-CM | POA: Diagnosis not present

## 2019-06-06 DIAGNOSIS — H04123 Dry eye syndrome of bilateral lacrimal glands: Secondary | ICD-10-CM | POA: Diagnosis not present

## 2019-06-06 DIAGNOSIS — H11441 Conjunctival cysts, right eye: Secondary | ICD-10-CM | POA: Diagnosis not present

## 2019-06-07 ENCOUNTER — Other Ambulatory Visit: Payer: Self-pay

## 2019-06-07 ENCOUNTER — Ambulatory Visit: Payer: PPO

## 2019-06-07 DIAGNOSIS — M545 Low back pain, unspecified: Secondary | ICD-10-CM

## 2019-06-07 DIAGNOSIS — M25652 Stiffness of left hip, not elsewhere classified: Secondary | ICD-10-CM

## 2019-06-07 DIAGNOSIS — M25651 Stiffness of right hip, not elsewhere classified: Secondary | ICD-10-CM

## 2019-06-07 NOTE — Therapy (Signed)
[facility] [address], [place], [address] Phone: [phone]   Fax:  [phone]  Physical Therapy Treatment  Patient Details  Name: Sherry Bernard MRN: [id number] Date of Birth: 08-11-1941 Referring Provider (PT): Nicki Reaper MD   Encounter Date: 06/07/2019  PT End of Session - 06/07/19 1353    Visit Number  14    Number of Visits  13    Date for PT Re-Evaluation  06/13/19    Authorization Type  4/10    PT Start Time  1300    PT Stop Time  1345    PT Time Calculation (min)  45 min    Activity Tolerance  Patient tolerated treatment well;No increased pain    Behavior During Therapy  WFL for tasks assessed/performed       Past Medical History:  Diagnosis Date   Colon polyp    Hyperthyroidism     Past Surgical History:  Procedure Laterality Date   APPENDECTOMY     child   TONSILECTOMY, ADENOIDECTOMY, BILATERAL MYRINGOTOMY AND TUBES     child    There were no vitals filed for this visit.  Subjective Assessment - 06/07/19 1314    Subjective  Pt reports a current 3-5/10 which she says has improved, compared to her very first visit. Pt has been unable to do her exercises the past week 2[place] being at the beach.    Pertinent History  hyperthyroidism     Limitations  Sitting;Lifting    How long can you sit comfortably?  Hurts with going from sitting to standing - decreases after 10 min of walking     Diagnostic tests  no    Patient Stated Goals  to decrease pain    Pain Score  5     Pain Location  Back    Pain Orientation  Distal    Pain Descriptors / Indicators  Aching;Constant    Pain Onset  More than a month ago       TREATMENT  Therapeutic Exercise Lumbar extension with use of a towel in standing - x20 Seated marching with posterior pelvic tilt on white Theraball x20, 7# with shoulder flexion x30 Lumbar lateral shift - x 20 B Seated posterior pelvic tilts on white Thereball x20 Standing  rotations with PVC - x 20 Squats- 2 x 15 Chop diagonals - 6.6# med ball, x15 each side Chop verticals - 6.6# med ball, x15 Lateral throws - 6.6# med ball, x15 each side   Manual therapy STM performed to patients multifidi B to decrease increased pain and spasms on the affected sides with patient positioned in prone - x 8 min   Exercises performed to improve lumbar extension and decrease pain and spasms along the multifidus musculature, as well as improve hip strength. Patient demonstrates no increase in pain at the end of the session      PT Education - 06/07/19 1352    Education Details  Pt educated on technique/form. HEP to continue as is.    Person(s) Educated  Patient    Methods  Explanation;Demonstration;Verbal cues    Comprehension  Verbalized understanding;Returned demonstration;Verbal cues required          PT Long Term Goals - 05/16/19 1703      PT LONG TERM GOAL #1   Title  Patient will be independent with HEP to continue benefits of therapy after discharge.    Baseline  Dependent with form/technique; Requires significant cueing with  exercise performance    Time  6    Period  Weeks    Status  On-going      PT LONG TERM GOAL #2   Title  Patient will be able to sit for >1 hour without increase in pain to better be able to rest comfortably without increase in pain    Baseline  Increased pain after 5 to 10 min; 05/16/2019: Able to sit for 3 hours    Time  6    Period  Weeks    Status  Achieved      PT LONG TERM GOAL #3   Title  Patient will have a worst pain in the low back to under 3/10 to demonstrate improvement with functional activities.    Baseline  7/10 worst pain; 05/16/2019: 5/10    Time  6    Period  Weeks    Status  On-going            Plan - 06/07/19 1405    Clinical Impression Statement  Pt presents today to PT with increased sx (3-5/10) but stated that she has improved since the onset of PT. Pt continues to perform increased volume and intensity  of exercise, without any demonstrative/reported inc in sx. Pt able to perform all exercises great technique, only requiring minimal cueing for posterior weight translation for squats and monster walks. Pt continues to demonstrate improved hip and core strength, as indicated by continued carryover of good technique and decreased observed muscular fatigue/lack of form breakdown. Pt also able to perform vertical and rotational chops with improved form and tolerance. Pt still demonstrates decreased hip/core strength, motor control with higher-level activities, and increased pain response with prolonged activity. Pt continues to improve but will benefit from further skilled therapy treatment to return to prior level of function.    Personal Factors and Comorbidities  Age;Comorbidity 1    Comorbidities  hyperthyroidism    Examination-Activity Limitations  Bend;Sit    Examination-Participation Restrictions  Community Activity    Stability/Clinical Decision Making  Stable/Uncomplicated    Rehab Potential  Good    PT Frequency  2x / week    PT Duration  6 weeks    PT Treatment/Interventions  Therapeutic exercise;Therapeutic activities;Balance training;Neuromuscular re-education;Cognitive remediation;Gait training;Electrical Stimulation;Moist Heat;Stair training;Patient/family education;Dry needling;Passive range of motion;Joint Manipulations;Spinal Manipulations    PT Next Visit Plan  progress strengthening and AROM based exercises    PT Home Exercise Plan  See education section    Consulted and Agree with Plan of Care  Patient       Patient will benefit from skilled therapeutic intervention in order to improve the following deficits and impairments:  Abnormal gait, Increased fascial restricitons, Pain, Decreased mobility, Decreased coordination, Increased muscle spasms, Decreased activity tolerance, Decreased strength, Decreased range of motion, Decreased endurance, Difficulty walking  Visit  Diagnosis: Acute bilateral low back pain without sciatica  Stiffness of right hip, not elsewhere classified  Stiffness of left hip, not elsewhere classified     Problem List Patient Active Problem List   Diagnosis Date Noted   Finger infection 03/05/2019   Skin infection 01/04/2019   URI (upper respiratory infection) 01/04/2019   Healthcare maintenance 12/05/2018   Left shoulder pain 03/12/2018   Anxiety 08/18/2017   Hematuria 08/16/2017   Low back pain 01/10/2017   Encounter for counseling 11/06/2016   Cough 10/06/2016   Diarrhea 10/06/2016   Fibrocystic breast disease 05/05/2016   Osteopenia 05/05/2016   Cyst of pancreas 08/13/2015  Serrated adenoma of colon 11/30/2014   Acquired hypothyroidism 08/02/2014   Rhytides 11/20/2013   Postmenopausal 10/13/1991    Scarlette Calico, SPT 06/07/2019, 2:06 PM  [facility] [address], [place], [address] Phone: [phone]   Fax:  [phone]  Name: Sherry Bernard MRN: [id number] Date of Birth: 1941-02-26

## 2019-06-12 ENCOUNTER — Ambulatory Visit: Payer: PPO

## 2019-06-12 ENCOUNTER — Other Ambulatory Visit: Payer: Self-pay

## 2019-06-12 DIAGNOSIS — M545 Low back pain, unspecified: Secondary | ICD-10-CM

## 2019-06-12 DIAGNOSIS — M25652 Stiffness of left hip, not elsewhere classified: Secondary | ICD-10-CM

## 2019-06-12 DIAGNOSIS — M25651 Stiffness of right hip, not elsewhere classified: Secondary | ICD-10-CM

## 2019-06-12 NOTE — Therapy (Signed)
[facility] [address] [place], [address] Phone: [phone]   Fax:  [phone]  Physical Therapy Treatment  Patient Details  Name: Sherry Bernard MRN: [id number] Date of Birth: 21-Oct-1940 Referring Provider (PT): Nicki Reaper MD   Encounter Date: 06/12/2019  PT End of Session - 06/12/19 1410    Visit Number  15    Number of Visits  21    Date for PT Re-Evaluation  06/13/19    Authorization Type  7/10    PT Start Time  1400    PT Stop Time  1430    PT Time Calculation (min)  30 min    Activity Tolerance  Patient tolerated treatment well;No increased pain    Behavior During Therapy  WFL for tasks assessed/performed       Past Medical History:  Diagnosis Date  . Colon polyp   . Hyperthyroidism     Past Surgical History:  Procedure Laterality Date  . APPENDECTOMY     child  . TONSILECTOMY, ADENOIDECTOMY, BILATERAL MYRINGOTOMY AND TUBES     child    There were no vitals filed for this visit.  Subjective Assessment - 06/12/19 1400    Subjective  Patient reports she has been busy this morning and has been running errands. Patient states today is a "good" day in terms of pain.    Pertinent History  hyperthyroidism     Limitations  Sitting;Lifting    How long can you sit comfortably?  Hurts with going from sitting to standing - decreases after 10 min of walking     Diagnostic tests  no    Patient Stated Goals  to decrease pain    Currently in Pain?  No/denies    Pain Onset  More than a month ago       TREATMENT Therapeutic Exercise Overhead extension with physioball -- x 25 Standing lumbar rotations with physioball -- x 25 Standing chops with physioball -- x 25 B Seated multifidus on physioball -- x 25   Manual therapy STM performed to multifidus on the affected side to decrease increased spasms and pain with patient in prone -- 33min  Performed exercises to improve pain and lumbar  mobility/strength.   PT Education - 06/12/19 1409    Education Details  form/technique with exercise    Person(s) Educated  Patient    Methods  Explanation;Demonstration    Comprehension  Verbalized understanding;Returned demonstration          PT Long Term Goals - 05/16/19 1703      PT LONG TERM GOAL #1   Title  Patient will be independent with HEP to continue benefits of therapy after discharge.    Baseline  Dependent with form/technique; Requires significant cueing with exercise performance    Time  6    Period  Weeks    Status  On-going      PT LONG TERM GOAL #2   Title  Patient will be able to sit for >1 hour without increase in pain to better be able to rest comfortably without increase in pain    Baseline  Increased pain after 5 to 10 min; 05/16/2019: Able to sit for 3 hours    Time  6    Period  Weeks    Status  Achieved      PT LONG TERM GOAL #3   Title  Patient will have a worst pain in the low back to under 3/10  to demonstrate improvement with functional activities.    Baseline  7/10 worst pain; 05/16/2019: 5/10    Time  6    Period  Weeks    Status  On-going            Plan - 06/12/19 1416    Clinical Impression Statement  Patient demonstrates improvement after performing manual therapy and demonstrated to patient self mobilization with LAX ball to further improvements at home. Cued to focus on improving multifidus strength and activiation with exercise and patient demonstrates no increase in pain throughout entirity of session. Patient has increased soreness along her mulitifidus with seated pelvic tilts which indicate improvement in coordination. Patient demonstrates improvement in symptoms however, continues to have days of increased symtpoms. Patient will benefit from further skilled therapy to return to prior level of function.    Personal Factors and Comorbidities  Age;Comorbidity 1    Comorbidities  hyperthyroidism    Examination-Activity Limitations   Bend;Sit    Examination-Participation Restrictions  Community Activity    Stability/Clinical Decision Making  Stable/Uncomplicated    Rehab Potential  Good    PT Frequency  2x / week    PT Duration  6 weeks    PT Treatment/Interventions  Therapeutic exercise;Therapeutic activities;Balance training;Neuromuscular re-education;Cognitive remediation;Gait training;Electrical Stimulation;Moist Heat;Stair training;Patient/family education;Dry needling;Passive range of motion;Joint Manipulations;Spinal Manipulations    PT Next Visit Plan  progress strengthening and AROM based exercises    PT Home Exercise Plan  See education section    Consulted and Agree with Plan of Care  Patient       Patient will benefit from skilled therapeutic intervention in order to improve the following deficits and impairments:  Abnormal gait, Increased fascial restricitons, Pain, Decreased mobility, Decreased coordination, Increased muscle spasms, Decreased activity tolerance, Decreased strength, Decreased range of motion, Decreased endurance, Difficulty walking  Visit Diagnosis: Acute bilateral low back pain without sciatica  Stiffness of right hip, not elsewhere classified  Stiffness of left hip, not elsewhere classified     Problem List Patient Active Problem List   Diagnosis Date Noted  . Finger infection 03/05/2019  . Skin infection 01/04/2019  . URI (upper respiratory infection) 01/04/2019  . Healthcare maintenance 12/05/2018  . Left shoulder pain 03/12/2018  . Anxiety 08/18/2017  . Hematuria 08/16/2017  . Low back pain 01/10/2017  . Encounter for counseling 11/06/2016  . Cough 10/06/2016  . Diarrhea 10/06/2016  . Fibrocystic breast disease 05/05/2016  . Osteopenia 05/05/2016  . Cyst of pancreas 08/13/2015  . 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 Acquired hypothyroidism 08/02/2014  . Rhytides 11/20/2013  . Postmenopausal 10/13/1991    Blythe Stanford, PT DPT 06/12/2019, 2:26 PM  [facility] [address] [place], [address] Phone: [phone]   Fax:  [phone]  Name: Sherry Bernard MRN: [id number] Date of Birth: 12-28-1940

## 2019-06-21 DIAGNOSIS — H04123 Dry eye syndrome of bilateral lacrimal glands: Secondary | ICD-10-CM | POA: Diagnosis not present

## 2019-06-21 DIAGNOSIS — H11441 Conjunctival cysts, right eye: Secondary | ICD-10-CM | POA: Diagnosis not present

## 2019-06-21 DIAGNOSIS — H1045 Other chronic allergic conjunctivitis: Secondary | ICD-10-CM | POA: Diagnosis not present

## 2019-06-22 ENCOUNTER — Other Ambulatory Visit
Admission: RE | Admit: 2019-06-22 | Discharge: 2019-06-22 | Disposition: A | Discharge status: Home / Self Care | Payer: PPO | Source: Ambulatory Visit | Attending: Gastroenterology | Admitting: Gastroenterology

## 2019-06-22 ENCOUNTER — Other Ambulatory Visit: Payer: Self-pay

## 2019-06-22 DIAGNOSIS — Z01812 Encounter for preprocedural laboratory examination: Secondary | ICD-10-CM | POA: Diagnosis not present

## 2019-06-22 DIAGNOSIS — Z20828 Contact with and (suspected) exposure to other viral communicable diseases: Secondary | ICD-10-CM | POA: Diagnosis not present

## 2019-06-22 LAB — SARS CORONAVIRUS 2 (TAT 6-24 HRS): SARS Coronavirus 2: NEGATIVE

## 2019-06-26 ENCOUNTER — Ambulatory Visit: Payer: PPO | Admitting: Anesthesiology

## 2019-06-26 ENCOUNTER — Other Ambulatory Visit: Payer: Self-pay

## 2019-06-26 ENCOUNTER — Ambulatory Visit
Admission: RE | Admit: 2019-06-26 | Discharge: 2019-06-26 | Disposition: A | Discharge status: Home / Self Care | Payer: PPO | Attending: Gastroenterology | Admitting: Gastroenterology

## 2019-06-26 ENCOUNTER — Encounter
Admission: RE | Disposition: A | Discharge status: Home / Self Care | Payer: Self-pay | Source: Home / Self Care | Attending: Gastroenterology

## 2019-06-26 DIAGNOSIS — E039 Hypothyroidism, unspecified: Secondary | ICD-10-CM | POA: Insufficient documentation

## 2019-06-26 DIAGNOSIS — Z79899 Other long term (current) drug therapy: Secondary | ICD-10-CM | POA: Diagnosis not present

## 2019-06-26 DIAGNOSIS — K635 Polyp of colon: Secondary | ICD-10-CM | POA: Diagnosis not present

## 2019-06-26 DIAGNOSIS — K5730 Diverticulosis of large intestine without perforation or abscess without bleeding: Secondary | ICD-10-CM | POA: Insufficient documentation

## 2019-06-26 DIAGNOSIS — D120 Benign neoplasm of cecum: Secondary | ICD-10-CM | POA: Insufficient documentation

## 2019-06-26 DIAGNOSIS — Z79890 Hormone replacement therapy (postmenopausal): Secondary | ICD-10-CM | POA: Diagnosis not present

## 2019-06-26 DIAGNOSIS — Z8719 Personal history of other diseases of the digestive system: Secondary | ICD-10-CM | POA: Diagnosis not present

## 2019-06-26 DIAGNOSIS — D126 Benign neoplasm of colon, unspecified: Secondary | ICD-10-CM | POA: Diagnosis not present

## 2019-06-26 DIAGNOSIS — Z86010 Personal history of colonic polyps: Secondary | ICD-10-CM | POA: Diagnosis not present

## 2019-06-26 DIAGNOSIS — K580 Irritable bowel syndrome with diarrhea: Secondary | ICD-10-CM | POA: Diagnosis not present

## 2019-06-26 DIAGNOSIS — K644 Residual hemorrhoidal skin tags: Secondary | ICD-10-CM | POA: Diagnosis not present

## 2019-06-26 DIAGNOSIS — K5790 Diverticulosis of intestine, part unspecified, without perforation or abscess without bleeding: Secondary | ICD-10-CM | POA: Diagnosis not present

## 2019-06-26 HISTORY — PX: COLONOSCOPY WITH PROPOFOL: SHX5780

## 2019-06-26 SURGERY — COLONOSCOPY WITH PROPOFOL
Anesthesia: General

## 2019-06-26 MED ORDER — SODIUM CHLORIDE (PF) 0.9 % IJ SOLN
INTRAMUSCULAR | Status: DC | PRN
  Administered 2019-06-26: 10 mL

## 2019-06-26 MED ORDER — EPHEDRINE SULFATE 50 MG/ML IJ SOLN
INTRAMUSCULAR | Status: DC | PRN
  Administered 2019-06-26 (×2): 10 mg via INTRAVENOUS

## 2019-06-26 MED ORDER — PROPOFOL 10 MG/ML IV BOLUS
INTRAVENOUS | Status: DC | PRN
  Administered 2019-06-26: 30 mg via INTRAVENOUS
  Administered 2019-06-26: 40 mg via INTRAVENOUS
  Administered 2019-06-26: 10 mg via INTRAVENOUS
  Administered 2019-06-26: 40 mg via INTRAVENOUS

## 2019-06-26 MED ORDER — SODIUM CHLORIDE 0.9 % IV SOLN
INTRAVENOUS | Status: DC
  Administered 2019-06-26 (×2): via INTRAVENOUS

## 2019-06-26 MED ORDER — PROPOFOL 10 MG/ML IV BOLUS
INTRAVENOUS | Status: AC
  Filled 2019-06-26: qty 20

## 2019-06-26 MED ORDER — PROPOFOL 500 MG/50ML IV EMUL
INTRAVENOUS | Status: AC
  Filled 2019-06-26: qty 50

## 2019-06-26 MED ORDER — EPHEDRINE SULFATE 50 MG/ML IJ SOLN
INTRAMUSCULAR | Status: AC
  Filled 2019-06-26: qty 1

## 2019-06-26 MED ORDER — PROPOFOL 500 MG/50ML IV EMUL
INTRAVENOUS | Status: DC | PRN
  Administered 2019-06-26: 180 ug/kg/min via INTRAVENOUS

## 2019-06-26 NOTE — Anesthesia Preprocedure Evaluation (Signed)
Anesthesia Evaluation  Patient identified by MRN, date of birth, ID band Patient awake    Reviewed: Allergy & Precautions, NPO status , Patient's Chart, lab work & pertinent test results  History of Anesthesia Complications Negative for: history of anesthetic complications  Airway Mallampati: II       Dental   Pulmonary neg sleep apnea, neg COPD, Not current smoker,           Cardiovascular (-) hypertension(-) Past MI and (-) CHF (-) dysrhythmias (-) Valvular Problems/Murmurs     Neuro/Psych neg Seizures Anxiety    GI/Hepatic Neg liver ROS, neg GERD  ,  Endo/Other  neg diabetesHypothyroidism   Renal/GU negative Renal ROS     Musculoskeletal   Abdominal   Peds  Hematology   Anesthesia Other Findings   Reproductive/Obstetrics                            Anesthesia Physical Anesthesia Plan  ASA: II  Anesthesia Plan: General   Post-op Pain Management:    Induction:   PONV Risk Score and Plan: 3 and TIVA, Propofol infusion and Ondansetron  Airway Management Planned: Nasal Cannula  Additional Equipment:   Intra-op Plan:   Post-operative Plan:   Informed Consent: I have reviewed the patients History and Physical, chart, labs and discussed the procedure including the risks, benefits and alternatives for the proposed anesthesia with the patient or authorized representative who has indicated his/her understanding and acceptance.       Plan Discussed with:   Anesthesia Plan Comments:         Anesthesia Quick Evaluation

## 2019-06-26 NOTE — Anesthesia Post-op Follow-up Note (Signed)
Anesthesia QCDR form completed.        

## 2019-06-26 NOTE — H&P (Signed)
Outpatient short stay form Pre-procedure 06/26/2019 10:28 AM Lollie Sails MD  Primary Physician: Einar Pheasant  Reason for visit : colonoscopy History of present illness:  : Patient is a 78 year old female presenting today for colonoscopy in regards her personal history of adenomatous colon polyps.  Tolerated her prep well.  Takes no aspirin or blood thinning agent.  Does have a history of chronic diarrhea and some fecal incontinence however this is improved greatly while taking Benefiber.  He denies any rectal bleeding with the exception of occasional bright red flash on toilet paper no abdominal pain.     Current Facility-Administered Medications:  .  0.9 %  sodium chloride infusion, , Intravenous, Continuous, Lollie Sails, MD, Last Rate: 20 mL/hr at 06/26/19 1010  Medications Prior to Admission  Medication Sig Dispense Refill Last Dose  . levothyroxine (SYNTHROID, LEVOTHROID) 75 MCG tablet TAKE 1 TABLET BY MOUTH ONCE DAILY. TAKE ON EMPTY STOMACH WITH A GLASSOF WATER AT LEAST 30-60 MINUTES BEFORE BREAKFAST 90 tablet 1 06/25/2019 at Unknown time  . Biotin 5 MG CAPS Take by mouth.   06/21/2019  . Calcium Carbonate-Simethicone 1000-60 MG CHEW Chew by mouth.   06/21/2019  . Calcium-Phosphorus-Vitamin D (CITRACAL +D3 PO) Take 2 tablets by mouth daily.   06/21/2019  . Glucosamine HCl-MSM (SM GLUCOSAMINE HCL-MSM) 750-750 MG TABS Take by mouth.   06/21/2019  . Multiple Vitamins-Minerals (CENTRUM SILVER 50+WOMEN) TABS Take 1 tablet by mouth daily.   06/21/2019  . mupirocin ointment (BACTROBAN) 2 % APPLY TO AFFECTED AREAS TWICE DAILY 22 g 0   . Probiotic Product (PROBIOTIC-10) CAPS Take by mouth.   06/21/2019     No Known Allergies   Past Medical History:  Diagnosis Date  . Colon polyp   . Hyperthyroidism     Review of systems:      Physical Exam    Heart and lungs: Regular rate and rhythm without rub or gallop lungs are bilaterally clear    HEENT: Normocephalic atraumatic eyes are  anicteric    Other:    Pertinant exam for procedure: Soft nontender nondistended bowel sounds positive normoactive    Planned proceedures: Colonoscopy and indicated procedures. I have discussed the risks benefits and complications of procedures to include not limited to bleeding, infection, perforation and the risk of sedation and the patient wishes to proceed.    Lollie Sails, MD Gastroenterology 06/26/2019  10:28 AM

## 2019-06-26 NOTE — Op Note (Signed)
[facility] Gastroenterology Patient Name: Sherry Bernard Procedure Date: 06/26/2019 10:31 AM MRN: [id number] Account #: [id number] Date of Birth: [birth year] Admit Type: Outpatient Age: 78 Room: [facility] ENDO ROOM 1 Gender: Female Note Status: Finalized Procedure:            Colonoscopy Indications:          Personal history of colonic polyps Providers:            Lollie Sails, MD Referring MD:         Einar Pheasant, MD (Referring MD) Medicines:            Monitored Anesthesia Care Complications:        No immediate complications. Procedure:            Pre-Anesthesia Assessment:                       - ASA Grade Assessment: II - A patient with mild                        systemic disease.                       After obtaining informed consent, the colonoscope was                        passed under direct vision. Throughout the procedure,                        the patient's blood pressure, pulse, and oxygen                        saturations were monitored continuously. The                        Colonoscope was introduced through the anus and                        advanced to the the cecum, identified by appendiceal                        orifice and ileocecal valve. The colonoscopy was                        performed without difficulty. The patient tolerated the                        procedure well. The quality of the bowel preparation                        was good. Findings:      Multiple small and large-mouthed diverticula were found in the sigmoid       colon and descending colon.      A 9 mm polyp was found in the cecum. The polyp was sessile. Polypectomy       was attempted, initially using a saline injection-lift technique with a       cold snare. Polyp resection was incomplete with this device. This       intervention then required a different device and polypectomy technique.       The polyp was removed with a cold biopsy forceps. Resection and  retrieval were complete. Ther site is noted to have good hemostasis.      Two sessile polyps were found in the cecum. The polyps were 3 to 5 mm in       size. These polyps were removed with a cold snare. Resection and       retrieval were complete.      Skin tags were found on perianal exam.      The digital rectal exam was normal otherwise. Impression:           - Diverticulosis in the sigmoid colon and in the                        descending colon.                       - One 9 mm polyp in the cecum, removed with a cold                        biopsy forceps. Resected and retrieved.                       - Two 3 to 5 mm polyps in the cecum, removed with a                        cold snare. Resected and retrieved.                       - Perianal skin tags found on perianal exam. Recommendation:       - Discharge patient to home.                       - Await pathology results.                       - Clear liquid diet today.                       - Full liquid diet for 1 day, then advance as tolerated                        to low residue diet for 2 days. Procedure Code(s):    --- Professional ---                       [phone], Colonoscopy, flexible; with removal of tumor(s),                        polyp(s), or other lesion(s) by snare technique                       [id number], Colonoscopy, flexible; with directed submucosal                        injection(s), any substance                       [id number], 59, Colonoscopy, flexible; with biopsy, single                        or multiple Diagnosis Code(s):    --- Professional ---  K63.5, Polyp of colon                       K64.4, Residual hemorrhoidal skin tags                       Z86.010, Personal history of colonic polyps                       K57.30, Diverticulosis of large intestine without                        perforation or abscess without bleeding CPT copyright 2019 American Medical Association. All  rights reserved. The codes documented in this report are preliminary and upon coder review may  be revised to meet current compliance requirements. Lollie Sails, MD 06/26/2019 11:46:43 AM This report has been signed electronically. Number of Addenda: 0 Note Initiated On: 06/26/2019 10:31 AM Scope Withdrawal Time: 0 hours 37 minutes 6 seconds  Total Procedure Duration: 0 hours 50 minutes 22 seconds       [facility]

## 2019-06-26 NOTE — Transfer of Care (Signed)
Immediate Anesthesia Transfer of Care Note  Patient: Sherry Bernard  Procedure(s) Performed: COLONOSCOPY WITH PROPOFOL (N/A )  Patient Location: PACU  Anesthesia Type:General  Level of Consciousness: awake and alert   Airway & Oxygen Therapy: Patient Spontanous Breathing and Patient connected to nasal cannula oxygen  Post-op Assessment: Report given to RN and Post -op Vital signs reviewed and stable  Post vital signs: Reviewed and stable  Last Vitals:  Vitals Value Taken Time  BP 85/39 06/26/19 1146  Temp    Pulse 88 06/26/19 1147  Resp 15 06/26/19 1147  SpO2 99 % 06/26/19 1147  Vitals shown include unvalidated device data.  Last Pain:  Vitals:   06/26/19 1003  TempSrc: Tympanic  PainSc: 3          Complications: No apparent anesthesia complications

## 2019-06-26 NOTE — Anesthesia Postprocedure Evaluation (Signed)
Anesthesia Post Note  Patient: Sherry Bernard  Procedure(s) Performed: COLONOSCOPY WITH PROPOFOL (N/A )  Patient location during evaluation: Endoscopy Anesthesia Type: General Level of consciousness: awake and alert Pain management: pain level controlled Vital Signs Assessment: post-procedure vital signs reviewed and stable Respiratory status: spontaneous breathing and respiratory function stable Cardiovascular status: stable Anesthetic complications: no     Last Vitals:  Vitals:   06/26/19 1157 06/26/19 1207  BP: 108/62 119/67  Pulse: 98 89  Resp: (!) 29   Temp:    SpO2: 98% 100%    Last Pain:  Vitals:   06/26/19 1207  TempSrc:   PainSc: 0-No pain                 KEPHART,WILLIAM K

## 2019-06-27 ENCOUNTER — Ambulatory Visit: Payer: PPO

## 2019-06-27 ENCOUNTER — Encounter: Payer: Self-pay | Admitting: Gastroenterology

## 2019-06-28 LAB — SURGICAL PATHOLOGY

## 2019-07-04 ENCOUNTER — Other Ambulatory Visit: Payer: Self-pay

## 2019-07-04 ENCOUNTER — Ambulatory Visit: Payer: PPO | Attending: Internal Medicine

## 2019-07-04 DIAGNOSIS — M545 Low back pain, unspecified: Secondary | ICD-10-CM

## 2019-07-04 DIAGNOSIS — M25651 Stiffness of right hip, not elsewhere classified: Secondary | ICD-10-CM | POA: Diagnosis not present

## 2019-07-04 DIAGNOSIS — M25652 Stiffness of left hip, not elsewhere classified: Secondary | ICD-10-CM | POA: Insufficient documentation

## 2019-07-04 NOTE — Therapy (Signed)
[place] [facility] [address] [place], [address] Phone: [phone]   Fax:  [phone]  Physical Therapy Treatment/Re-cert  Patient Details  Name: Sherry Bernard MRN: [id number] Date of Birth: 07-24-1941 Referring Provider (PT): Nicki Reaper MD   Encounter Date: 07/04/2019  PT End of Session - 07/04/19 1608    Visit Number  16    Number of Visits  21    Date for PT Re-Evaluation  06/13/19    Authorization Type  1/10    PT Start Time  1515    PT Stop Time  1600    PT Time Calculation (min)  45 min    Activity Tolerance  Patient tolerated treatment well;No increased pain    Behavior During Therapy  WFL for tasks assessed/performed       Past Medical History:  Diagnosis Date  . Colon polyp   . Hyperthyroidism     Past Surgical History:  Procedure Laterality Date  . APPENDECTOMY     child  . COLONOSCOPY WITH PROPOFOL N/A 06/26/2019   Procedure: COLONOSCOPY WITH PROPOFOL;  Surgeon: Lollie Sails, MD;  Location: [facility] ENDOSCOPY;  Service: Endoscopy;  Laterality: N/A;  . TONSILECTOMY, ADENOIDECTOMY, BILATERAL MYRINGOTOMY AND TUBES     child    There were no vitals filed for this visit.  Subjective Assessment - 07/04/19 1536    Subjective  Patient reports she is worried she is not improving, however, she states she only has pain with prolonged sitting most notably when sitting in the car.    Pertinent History  hyperthyroidism     Limitations  Sitting;Lifting    How long can you sit comfortably?  Hurts with going from sitting to standing - decreases after 10 min of walking     Diagnostic tests  no    Patient Stated Goals  to decrease pain    Currently in Pain?  No/denies    Pain Onset  More than a month ago       TREATMENT Therapeutic Exercise Hooklying bridges - x35 LTRs in hooklying - x 35 Sitting pelvic tilts - 2 x 30 Standing lumbar extension in standing - x 30 Standing scapular retraction BTB -  x 25 Standing no money (B shoulder ER) BTB - x 25  Performed exercises to improve pain and lumbar mobility/strength.  PT Education - 07/04/19 1608    Education Details  form/technique with exercise    Person(s) Educated  Patient    Methods  Explanation;Demonstration    Comprehension  Verbalized understanding;Returned demonstration          PT Long Term Goals - 07/04/19 1638      PT LONG TERM GOAL #1   Title  Patient will be independent with HEP to continue benefits of therapy after discharge.    Baseline  Dependent with form/technique; Requires significant cueing with exercise performance; Heavy cues required for exercise performance.    Time  6    Period  Weeks    Status  On-going      PT LONG TERM GOAL #2   Title  Patient will be able to sit for >1 hour without increase in pain to better be able to rest comfortably without increase in pain    Baseline  Increased pain after 5 to 10 min; 05/16/2019: Able to sit for 3 hours    Time  6    Period  Weeks    Status  Achieved  PT LONG TERM GOAL #3   Title  Patient will have a worst pain in the low back to under 3/10 to demonstrate improvement with functional activities.    Baseline  7/10 worst pain; 05/16/2019: 5/10; 07/04/2019: 3/10    Time  6    Period  Weeks    Status  On-going      PT LONG TERM GOAL #4   Title  Patient will have no increase in pain when standing after sitting for >1hour to better be able to drive for longer periods of time.    Baseline  5 minutes of sitting    Time  6    Period  Weeks    Status  New            Plan - 07/04/19 1611    Clinical Impression Statement  Patient demonstrates improvement overall with exercise, requiring less cueing overall to perform with appropriate technique. Patient is making progress towards long term goals however still demonstrates increased pain with prolonged sitting this limits her ability to perform prolonged driving. Patient demonstrates less pain with exercise  performance and patient will benefit from further skilled therapy to return to prior level of function.    Personal Factors and Comorbidities  Age;Comorbidity 1    Comorbidities  hyperthyroidism    Examination-Activity Limitations  Bend;Sit    Examination-Participation Restrictions  Community Activity    Stability/Clinical Decision Making  Stable/Uncomplicated    Rehab Potential  Good    PT Frequency  2x / week    PT Duration  6 weeks    PT Treatment/Interventions  Therapeutic exercise;Therapeutic activities;Balance training;Neuromuscular re-education;Cognitive remediation;Gait training;Electrical Stimulation;Moist Heat;Stair training;Patient/family education;Dry needling;Passive range of motion;Joint Manipulations;Spinal Manipulations    PT Next Visit Plan  progress strengthening and AROM based exercises    PT Home Exercise Plan  See education section    Consulted and Agree with Plan of Care  Patient       Patient will benefit from skilled therapeutic intervention in order to improve the following deficits and impairments:  Abnormal gait, Increased fascial restricitons, Pain, Decreased mobility, Decreased coordination, Increased muscle spasms, Decreased activity tolerance, Decreased strength, Decreased range of motion, Decreased endurance, Difficulty walking  Visit Diagnosis: Acute bilateral low back pain without sciatica  Stiffness of right hip, not elsewhere classified  Stiffness of left hip, not elsewhere classified     Problem List Patient Active Problem List   Diagnosis Date Noted  . Finger infection 03/05/2019  . Skin infection 01/04/2019  . URI (upper respiratory infection) 01/04/2019  . Healthcare maintenance 12/05/2018  . Left shoulder pain 03/12/2018  . Anxiety 08/18/2017  . Hematuria 08/16/2017  . Low back pain 01/10/2017  . Encounter for counseling 11/06/2016  . Cough 10/06/2016  . Diarrhea 10/06/2016  . Fibrocystic breast disease 05/05/2016  . Osteopenia  05/05/2016  . Cyst of pancreas 08/13/2015  . 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 Acquired hypothyroidism 08/02/2014  . Rhytides 11/20/2013  . Postmenopausal 10/13/1991    Blythe Stanford, PT DPT 07/04/2019, 4:45 PM  [place] [facility] [address] [place], [address] Phone: [phone]   Fax:  [phone]  Name: Sherry Bernard MRN: [id number] Date of Birth: [birth year]

## 2019-07-06 ENCOUNTER — Other Ambulatory Visit: Payer: Self-pay

## 2019-07-07 ENCOUNTER — Encounter: Payer: Self-pay | Admitting: Internal Medicine

## 2019-07-07 ENCOUNTER — Ambulatory Visit (INDEPENDENT_AMBULATORY_CARE_PROVIDER_SITE_OTHER): Payer: PPO

## 2019-07-07 ENCOUNTER — Ambulatory Visit: Payer: Self-pay

## 2019-07-07 DIAGNOSIS — Z0000 Encounter for general adult medical examination without abnormal findings: Secondary | ICD-10-CM | POA: Diagnosis not present

## 2019-07-07 NOTE — Progress Notes (Addendum)
Subjective:   Sherry Bernard is a 78 y.o. female who presents for Medicare Annual (Subsequent) preventive examination.  Review of Systems:  No ROS.  Medicare Wellness Virtual Visit.  Visual/audio telehealth visit, UTA vital signs.   See social history for additional risk factors.   Cardiac Risk Factors include: advanced age (>15men, >89 women)     Objective:     Vitals: There were no vitals taken for this visit.  There is no height or weight on file to calculate BMI.  Advanced Directives 07/07/2019 05/04/2018 05/03/2017 01/20/2016  Does Patient Have a Medical Advance Directive? Yes Yes Yes No  Type of Paramedic of [place];Living will [place];Living will Living will -  Does patient want to make changes to medical advance directive? No - Patient declined No - Patient declined No - Patient declined -  Copy of [place] in Chart? No - copy requested No - copy requested - -  Would patient like information on creating a medical advance directive? - - - Yes - Scientist, clinical (histocompatibility and immunogenetics) given    Tobacco Social History   Tobacco Use  Smoking Status Never Smoker  Smokeless Tobacco Never Used     Counseling given: Not Answered   Clinical Intake:  Pre-visit preparation completed: Yes        Diabetes: No  How often do you need to have someone help you when you read instructions, pamphlets, or other written materials from your doctor or pharmacy?: 1 - Never  Interpreter Needed?: No     Past Medical History:  Diagnosis Date  . Colon polyp   . Hyperthyroidism    Past Surgical History:  Procedure Laterality Date  . APPENDECTOMY     child  . COLONOSCOPY WITH PROPOFOL N/A 06/26/2019   Procedure: COLONOSCOPY WITH PROPOFOL;  Surgeon: Lollie Sails, MD;  Location: [facility] ENDOSCOPY;  Service: Endoscopy;  Laterality: N/A;  . TONSILECTOMY, ADENOIDECTOMY, BILATERAL MYRINGOTOMY AND TUBES     child   Family History   Problem Relation Age of Onset  . Breast cancer Mother    Social History   Socioeconomic History  . Marital status: Married    Spouse name: Not on file  . Number of children: 3  . Years of education: Not on file  . Highest education level: Not on file  Occupational History  . Occupation: retired  Scientific laboratory technician  . Financial resource strain: Not hard at all  . Food insecurity    Worry: Never true    Inability: Never true  . Transportation needs    Medical: No    Non-medical: No  Tobacco Use  . Smoking status: Never Smoker  . Smokeless tobacco: Never Used  Substance and Sexual Activity  . Alcohol use: Yes    Alcohol/week: 2.0 - 3.0 standard drinks    Types: 2 - 3 Glasses of wine per week    Comment: socially  . Drug use: No  . Sexual activity: Yes  Lifestyle  . Physical activity    Days per week: 7 days    Minutes per session: 60 min  . Stress: Not at all  Relationships  . Social Herbalist on phone: Not on file    Gets together: Not on file    Attends religious service: Not on file    Active member of club or organization: Not on file    Attends meetings of clubs or organizations: Not on file  Relationship status: Not on file  Other Topics Concern  . Not on file  Social History Narrative  . Not on file    Outpatient Encounter Medications as of 07/07/2019  Medication Sig  . Biotin 5 MG CAPS Take by mouth.  . Calcium Carbonate-Simethicone 1000-60 MG CHEW Chew by mouth.  . Calcium-Phosphorus-Vitamin D (CITRACAL +D3 PO) Take 2 tablets by mouth daily.  . Glucosamine HCl-MSM (SM GLUCOSAMINE HCL-MSM) 750-750 MG TABS Take by mouth.  . levothyroxine (SYNTHROID, LEVOTHROID) 75 MCG tablet TAKE 1 TABLET BY MOUTH ONCE DAILY. TAKE ON EMPTY STOMACH WITH A GLASSOF WATER AT LEAST 30-60 MINUTES BEFORE BREAKFAST  . Lifitegrast (XIIDRA) 5 % SOLN   . Multiple Vitamins-Minerals (CENTRUM SILVER 50+WOMEN) TABS Take 1 tablet by mouth daily.  . mupirocin ointment (BACTROBAN)  2 % APPLY TO AFFECTED AREAS TWICE DAILY  . Probiotic Product (PROBIOTIC-10) CAPS Take by mouth.   No facility-administered encounter medications on file as of 07/07/2019.     Activities of Daily Living In your present state of health, do you have any difficulty performing the following activities: 07/07/2019  Hearing? N  Vision? N  Difficulty concentrating or making decisions? N  Walking or climbing stairs? N  Dressing or bathing? N  Doing errands, shopping? N  Preparing Food and eating ? N  Using the Toilet? N  In the past six months, have you accidently leaked urine? N  Do you have problems with loss of bowel control? N  Managing your Medications? N  Managing your Finances? N  Housekeeping or managing your Housekeeping? N  Some recent data might be hidden    Patient Care Team: Einar Pheasant, MD as PCP - General (Internal Medicine)    Assessment:   This is a routine wellness examination for [place].  I connected with patient 07/07/19 at  9:00 AM EDT by an audio enabled telemedicine application and verified that I am speaking with the correct person using two identifiers. Patient stated full name and DOB. Patient gave permission to continue with virtual visit. Patient's location was at home and Nurse's location was at [place] office.   Health Maintenance Due: -Influenza vaccine 2020- discussed; to be completed in season with doctor or local pharmacy.   Update all pending maintenance due as appropriate.   See completed HM at the end of note.   Eye: Visual acuity not assessed. Virtual visit. Followed by their ophthalmologist every 12 months.   Dental: Visits every 6 months.    Hearing: Demonstrates normal hearing during visit.  Safety:  Patient feels safe at home- yes Patient does have smoke detectors at home- yes Patient does wear sunscreen or protective clothing when in direct sunlight - yes Patient does wear seat belt when in a moving vehicle - yes Patient drives- yes  Adequate lighting in walkways free from debris- yes Grab bars and handrails used as appropriate- yes Ambulates with no assistive device Cell phone on person when ambulating outside of the home- yes  Social: Alcohol intake - yes      Smoking history- never   Smokers in home? none Illicit drug use? none  Depression: PHQ 2 &9 complete. See screening below. Denies irritability, anhedonia, sadness/tearfullness.  Stable.   Falls: See screening below.    Medication: Taking as directed and without issues.   Covid-19: Precautions and sickness symptoms discussed. Wears mask, social distancing, hand hygiene as appropriate.   Activities of Daily Living Patient denies needing assistance with: household chores, feeding themselves, getting from bed to  chair, getting to the toilet, bathing/showering, dressing, managing money, or preparing meals.   Memory: Patient is alert. Patient denies difficulty focusing or concentrating. Correctly identified the president of the [place], season and recall. Patient likes to read, play computer games for brain stimulation.  BMI- discussed the importance of a healthy diet, water intake and the benefits of aerobic exercise.  Educational material provided.  Physical activity- physical therapy 1 time weekly, 45 minutes. Walking 10, 000 steps daily.   Diet:  Healthy Water: good intake Caffeine: no  Advanced Directive: End of life planning; Advance aging; Advanced directives discussed.  Copy of current HCPOA/Living Will requested.    Other Providers Patient Care Team: Einar Pheasant, MD as PCP - General (Internal Medicine)  Exercise Activities and Dietary recommendations Current Exercise Habits: Home exercise routine, Type of exercise: walking(physical therapy for L shoulder), Intensity: Mild  Goals    . Eat Less junk food    . Healthy Lifestyle     Read more  Stay hydrated Eat healthy Give your arm time to heal    . Increase water intake        Fall Risk Fall Risk  07/07/2019 05/04/2018 05/03/2017 09/30/2016  Falls in the past year? 0 No No No   Timed Get Up and Go performed: no, virtual visits  Depression Screen PHQ 2/9 Scores 07/07/2019 05/04/2018 05/03/2017 09/30/2016  PHQ - 2 Score 0 0 0 0  PHQ- 9 Score - - - 1     Cognitive Function MMSE - Mini Mental State Exam 05/03/2017  Orientation to time 5  Orientation to Place 5  Registration 3  Attention/ Calculation 5  Recall 3  Language- name 2 objects 2  Language- repeat 1  Language- follow 3 step command 3  Language- read & follow direction 1  Write a sentence 1  Copy design 1  Total score 30     6CIT Screen 07/07/2019 05/04/2018  What Year? 0 points 0 points  What month? 0 points 0 points  What time? 0 points 0 points  Count back from 20 0 points 0 points  Months in reverse 0 points 0 points  Repeat phrase 0 points 0 points  Total Score 0 0    Immunization History  Administered Date(s) Administered  . Influenza-Unspecified 07/22/2015, 07/11/2016, 07/06/2017, 07/15/2018  . Pneumococcal Conjugate-13 08/04/2013, 07/22/2015  . Pneumococcal Polysaccharide-23 05/03/2017  . Tdap 08/10/2014, 10/09/2014  . Zoster Recombinat (Shingrix) 01/05/2018, 04/11/2018   Screening Tests Health Maintenance  Topic Date Due  . INFLUENZA VACCINE  05/13/2019  . TETANUS/TDAP  10/09/2024  . DEXA SCAN  Completed  . PNA vac Low Risk Adult  Completed      Plan:   Keep all routine maintenance appointments.   Cpe 07/10/19 @ 10:00  Medicare Attestation I have personally reviewed: The patient's medical and social history Their use of alcohol, tobacco or illicit drugs Their current medications and supplements The patient's functional ability including ADLs,fall risks, home safety risks, cognitive, and hearing and visual impairment Diet and physical activities Evidence for depression    In addition, I have reviewed and discussed with patient certain preventive protocols, quality  metrics, and best practice recommendations. A written personalized care plan for preventive services as well as general preventive health recommendations were provided to patient via mail.     Varney Biles, LPN  [phone]   Reviewed above information.  Agree with assessment and plan.    Dr Nicki Reaper

## 2019-07-07 NOTE — Patient Instructions (Addendum)
  Sherry Bernard , Thank you for taking time to come for your Medicare Wellness Visit. I appreciate your ongoing commitment to your health goals. Please review the following plan we discussed and let me know if I can assist you in the future.   These are the goals we discussed: Goals    . Eat Less junk food    . Healthy Lifestyle     Read more  Stay hydrated Eat healthy Give your arm time to heal    . Increase water intake       This is a list of the screening recommended for you and due dates:  Health Maintenance  Topic Date Due  . Flu Shot  05/13/2019  . Tetanus Vaccine  10/09/2024  . DEXA scan (bone density measurement)  Completed  . Pneumonia vaccines  Completed

## 2019-07-10 ENCOUNTER — Ambulatory Visit (INDEPENDENT_AMBULATORY_CARE_PROVIDER_SITE_OTHER): Payer: PPO | Admitting: Internal Medicine

## 2019-07-10 ENCOUNTER — Ambulatory Visit (INDEPENDENT_AMBULATORY_CARE_PROVIDER_SITE_OTHER): Payer: PPO

## 2019-07-10 ENCOUNTER — Encounter: Payer: Self-pay | Admitting: Internal Medicine

## 2019-07-10 ENCOUNTER — Other Ambulatory Visit: Payer: Self-pay

## 2019-07-10 ENCOUNTER — Ambulatory Visit: Payer: PPO

## 2019-07-10 VITALS — BP 124/70 | HR 80 | Temp 95.1°F | Resp 16 | Ht 63.5 in | Wt 121.0 lb

## 2019-07-10 DIAGNOSIS — M25512 Pain in left shoulder: Secondary | ICD-10-CM

## 2019-07-10 DIAGNOSIS — E039 Hypothyroidism, unspecified: Secondary | ICD-10-CM

## 2019-07-10 DIAGNOSIS — F419 Anxiety disorder, unspecified: Secondary | ICD-10-CM | POA: Diagnosis not present

## 2019-07-10 DIAGNOSIS — M5136 Other intervertebral disc degeneration, lumbar region: Secondary | ICD-10-CM | POA: Diagnosis not present

## 2019-07-10 DIAGNOSIS — Z0000 Encounter for general adult medical examination without abnormal findings: Secondary | ICD-10-CM

## 2019-07-10 DIAGNOSIS — M5441 Lumbago with sciatica, right side: Secondary | ICD-10-CM

## 2019-07-10 DIAGNOSIS — G8929 Other chronic pain: Secondary | ICD-10-CM | POA: Diagnosis not present

## 2019-07-10 DIAGNOSIS — Z13220 Encounter for screening for lipoid disorders: Secondary | ICD-10-CM | POA: Diagnosis not present

## 2019-07-10 DIAGNOSIS — Z23 Encounter for immunization: Secondary | ICD-10-CM | POA: Diagnosis not present

## 2019-07-10 LAB — COMPREHENSIVE METABOLIC PANEL
ALT: 16 U/L (ref 0–35)
AST: 24 U/L (ref 0–37)
Albumin: 4.2 g/dL (ref 3.5–5.2)
Alkaline Phosphatase: 69 U/L (ref 39–117)
BUN: 18 mg/dL (ref 6–23)
CO2: 30 mEq/L (ref 19–32)
Calcium: 9.9 mg/dL (ref 8.4–10.5)
Chloride: 101 mEq/L (ref 96–112)
Creatinine, Ser: 0.76 mg/dL (ref 0.40–1.20)
GFR: 73.66 mL/min (ref >60.00)
Glucose, Bld: 90 mg/dL (ref 70–99)
Potassium: 4.1 mEq/L (ref 3.5–5.1)
Sodium: 140 mEq/L (ref 135–145)
Total Bilirubin: 0.5 mg/dL (ref 0.2–1.2)
Total Protein: 6.7 g/dL (ref 6.0–8.3)

## 2019-07-10 LAB — LIPID PANEL
Cholesterol: 190 mg/dL (ref 0–200)
HDL: 76.5 mg/dL (ref >39.00)
LDL Cholesterol: 94 mg/dL (ref 0–99)
NonHDL: 113.06
Total CHOL/HDL Ratio: 2
Triglycerides: 94 mg/dL (ref 0.0–149.0)
VLDL: 18.8 mg/dL (ref 0.0–40.0)

## 2019-07-10 NOTE — Progress Notes (Signed)
Patient ID: Sherry Bernard, female   DOB: Aug 02, 1941, 78 y.o.   MRN: [id number]   Subjective:    Patient ID: Sherry Bernard, female    DOB: 01-03-1941, 79 y.o.   MRN: [id number]  HPI  Patient here for her physical exam.  She reports she is doing relatively well.  Increased stress with covid, etc.  Trying to stay active.  Going to therapy.  Working out with a Clinical research associate.  Has back and left posterior shoulder pain.  Therapy is helping.  Request therapy for her left shoulder.  No chest pain.  No sob.  No acid reflux.  No abdominal pain.  Bowels moving.  benefiber has helped her bowels.  Just had repeat colonoscopy.     Past Medical History:  Diagnosis Date  . Colon polyp   . Hyperthyroidism    Past Surgical History:  Procedure Laterality Date  . APPENDECTOMY     child  . COLONOSCOPY WITH PROPOFOL N/A 06/26/2019   Procedure: COLONOSCOPY WITH PROPOFOL;  Surgeon: Lollie Sails, MD;  Location: [facility] ENDOSCOPY;  Service: Endoscopy;  Laterality: N/A;  . TONSILECTOMY, ADENOIDECTOMY, BILATERAL MYRINGOTOMY AND TUBES     child   Family History  Problem Relation Age of Onset  . Breast cancer Mother    Social History   Socioeconomic History  . Marital status: Married    Spouse name: Not on file  . Number of children: 3  . Years of education: Not on file  . Highest education level: Not on file  Occupational History  . Occupation: retired  Scientific laboratory technician  . Financial resource strain: Not hard at all  . Food insecurity    Worry: Never true    Inability: Never true  . Transportation needs    Medical: No    Non-medical: No  Tobacco Use  . Smoking status: Never Smoker  . Smokeless tobacco: Never Used  Substance and Sexual Activity  . Alcohol use: Yes    Alcohol/week: 2.0 - 3.0 standard drinks    Types: 2 - 3 Glasses of wine per week    Comment: socially  . Drug use: No  . Sexual activity: Yes  Lifestyle  . Physical activity    Days per week: 7 days    Minutes per session: 60 min  .  Stress: Not at all  Relationships  . Social Herbalist on phone: Not on file    Gets together: Not on file    Attends religious service: Not on file    Active member of club or organization: Not on file    Attends meetings of clubs or organizations: Not on file    Relationship status: Not on file  Other Topics Concern  . Not on file  Social History Narrative  . Not on file    Outpatient Encounter Medications as of 07/10/2019  Medication Sig  . Omega-3 Fatty Acids (FISH OIL PO) Take by mouth.  . Biotin 5 MG CAPS Take by mouth.  . Calcium Carbonate-Simethicone 1000-60 MG CHEW Chew by mouth.  . Calcium-Phosphorus-Vitamin D (CITRACAL +D3 PO) Take 2 tablets by mouth daily.  . Glucosamine HCl-MSM (SM GLUCOSAMINE HCL-MSM) 750-750 MG TABS Take by mouth.  Marland Kitchen Lifitegrast (XIIDRA) 5 % SOLN   . Multiple Vitamins-Minerals (CENTRUM SILVER 50+WOMEN) TABS Take 1 tablet by mouth daily.  . mupirocin ointment (BACTROBAN) 2 % APPLY TO AFFECTED AREAS TWICE DAILY  . Probiotic Product (PROBIOTIC-10) CAPS Take by mouth.  . [DISCONTINUED] levothyroxine (SYNTHROID,  LEVOTHROID) 75 MCG tablet TAKE 1 TABLET BY MOUTH ONCE DAILY. TAKE ON EMPTY STOMACH WITH A GLASSOF WATER AT LEAST 30-60 MINUTES BEFORE BREAKFAST   No facility-administered encounter medications on file as of 07/10/2019.     Review of Systems  Constitutional: Negative for appetite change and unexpected weight change.  HENT: Negative for congestion and sinus pressure.   Eyes: Negative for pain and visual disturbance.  Respiratory: Negative for cough, chest tightness and shortness of breath.   Cardiovascular: Negative for chest pain, palpitations and leg swelling.  Gastrointestinal: Negative for abdominal pain, diarrhea, nausea and vomiting.  Genitourinary: Negative for difficulty urinating and dysuria.  Musculoskeletal: Positive for back pain. Negative for joint swelling and myalgias.       Left posterior shoulder pain.   Skin: Negative  for color change and rash.  Neurological: Negative for dizziness, light-headedness and headaches.  Hematological: Negative for adenopathy. Does not bruise/bleed easily.  Psychiatric/Behavioral: Negative for agitation and dysphoric mood.       Objective:    Physical Exam Constitutional:      General: She is not in acute distress.    Appearance: Normal appearance. She is well-developed.  HENT:     Right Ear: External ear normal.     Left Ear: External ear normal.  Eyes:     General: No scleral icterus.       Right eye: No discharge.        Left eye: No discharge.     Conjunctiva/sclera: Conjunctivae normal.  Neck:     Musculoskeletal: Neck supple. No neck rigidity.     Thyroid: No thyromegaly.  Cardiovascular:     Rate and Rhythm: Normal rate and regular rhythm.  Pulmonary:     Effort: No tachypnea, accessory muscle usage or respiratory distress.     Breath sounds: Normal breath sounds. No decreased breath sounds or wheezing.  Chest:     Breasts:        Right: No inverted nipple, mass, nipple discharge or tenderness (no axillary adenopathy).        Left: No inverted nipple, mass, nipple discharge or tenderness (no axilarry adenopathy).  Abdominal:     General: Bowel sounds are normal.     Palpations: Abdomen is soft.     Tenderness: There is no abdominal tenderness.  Musculoskeletal:        General: No swelling or tenderness.  Lymphadenopathy:     Cervical: No cervical adenopathy.  Skin:    Findings: No erythema or rash.  Neurological:     Mental Status: She is alert and oriented to person, place, and time.  Psychiatric:        Mood and Affect: Mood normal.        Behavior: Behavior normal.     BP 124/70   Pulse 80   Temp (!) 95.1 F (35.1 C)   Resp 16   Ht 5' 3.5" (1.613 m)   Wt 121 lb (54.9 kg)   SpO2 98%   BMI 21.10 kg/m  Wt Readings from Last 3 Encounters:  07/10/19 121 lb (54.9 kg)  06/26/19 124 lb (56.2 kg)  05/05/19 124 lb 12.8 oz (56.6 kg)      Lab Results  Component Value Date   WBC 7.4 01/04/2019   HGB 13.4 01/04/2019   HCT 38.3 01/04/2019   PLT 359.0 01/04/2019   GLUCOSE 90 07/10/2019   CHOL 190 07/10/2019   TRIG 94.0 07/10/2019   HDL 76.50 07/10/2019   LDLCALC 94 07/10/2019  ALT 16 07/10/2019   AST 24 07/10/2019   NA 140 07/10/2019   K 4.1 07/10/2019   CL 101 07/10/2019   CREATININE 0.76 07/10/2019   BUN 18 07/10/2019   CO2 30 07/10/2019   TSH 2.62 01/04/2019   INR 0.8 02/29/2012       Assessment & Plan:   Problem List Items Addressed This Visit    Acquired hypothyroidism    On thyroid replacement.  Follow tsh.       Anxiety    Stable.        Left shoulder pain    Going to therapy.  Discuss with therapy - adding PT for her shoulder.        Low back pain    Check xray given persistent pain.  Going to therapy.       Relevant Orders   DG Lumbar Spine 2-3 Views (Completed)   Comprehensive metabolic panel (Completed)    Other Visit Diagnoses    Routine general medical examination at a health care facility    -  Primary   Screening cholesterol level       Relevant Orders   Lipid panel (Completed)   Need for immunization against influenza       Relevant Orders   Flu Vaccine QUAD High Dose(Fluad) (Completed)       Einar Pheasant, MD

## 2019-07-11 ENCOUNTER — Ambulatory Visit: Payer: PPO

## 2019-07-11 DIAGNOSIS — M25651 Stiffness of right hip, not elsewhere classified: Secondary | ICD-10-CM

## 2019-07-11 DIAGNOSIS — M545 Low back pain, unspecified: Secondary | ICD-10-CM

## 2019-07-11 DIAGNOSIS — M25652 Stiffness of left hip, not elsewhere classified: Secondary | ICD-10-CM

## 2019-07-11 NOTE — Therapy (Signed)
[facility] [address], [place], [address] Phone: [phone]   Fax:  [phone]  Physical Therapy Treatment  Patient Details  Name: Sherry Bernard MRN: [id number] Date of Birth: 04-09-1941 Referring Provider (PT): Nicki Reaper MD   Encounter Date: 07/11/2019  PT End of Session - 07/11/19 1434    Visit Number  17    Number of Visits  21    Date for PT Re-Evaluation  06/13/19    Authorization Type  2/10    PT Start Time  1400    PT Stop Time  1445    PT Time Calculation (min)  45 min    Activity Tolerance  Patient tolerated treatment well;No increased pain    Behavior During Therapy  WFL for tasks assessed/performed       Past Medical History:  Diagnosis Date  . Colon polyp   . Hyperthyroidism     Past Surgical History:  Procedure Laterality Date  . APPENDECTOMY     child  . COLONOSCOPY WITH PROPOFOL N/A 06/26/2019   Procedure: COLONOSCOPY WITH PROPOFOL;  Surgeon: Lollie Sails, MD;  Location: [facility] ENDOSCOPY;  Service: Endoscopy;  Laterality: N/A;  . TONSILECTOMY, ADENOIDECTOMY, BILATERAL MYRINGOTOMY AND TUBES     child    There were no vitals filed for this visit.  Subjective Assessment - 07/11/19 1407    Subjective  Patient reports she had an x ray which she is worried about the need of surgery. Patient reports she is getting an order for the shoulder.    Pertinent History  hyperthyroidism     Limitations  Sitting;Lifting    How long can you sit comfortably?  Hurts with going from sitting to standing - decreases after 10 min of walking     Diagnostic tests  no    Patient Stated Goals  to decrease pain    Currently in Pain?  No/denies    Pain Onset  More than a month ago       TREATMENT Therapeutic Exercise Chops overhead with 6.6# ball - x 15 B Seated pelvic tilts - x 15 Setaed pelvic tilts with grey theraband - x 20 Bridges in hooklying with gray theraband around knees and 20# over  pelvis to increase resistance LTRs against resistance - manual resistance from therapist - x 20  Standing pelvic tilts with UE support with therapist support for tactile cueing - x 20  Dry Needling (1) Dry needle placed along the multifidus B with patient positioned in prone to decrease increased pain and spasms. Utilized piston techniques with .3 x 19mm needle. Patient educated on benefits and risks of treatment. Patient verbal consents to treatment.  PT Education - 07/11/19 1416    Education Details  form/technique with exercise    Person(s) Educated  Patient    Methods  Explanation;Demonstration    Comprehension  Verbalized understanding;Returned demonstration          PT Long Term Goals - 07/04/19 1638      PT LONG TERM GOAL #1   Title  Patient will be independent with HEP to continue benefits of therapy after discharge.    Baseline  Dependent with form/technique; Requires significant cueing with exercise performance; Heavy cues required for exercise performance.    Time  6    Period  Weeks    Status  On-going      PT LONG TERM GOAL #2   Title  Patient will be able to sit  for >1 hour without increase in pain to better be able to rest comfortably without increase in pain    Baseline  Increased pain after 5 to 10 min; 05/16/2019: Able to sit for 3 hours    Time  6    Period  Weeks    Status  Achieved      PT LONG TERM GOAL #3   Title  Patient will have a worst pain in the low back to under 3/10 to demonstrate improvement with functional activities.    Baseline  7/10 worst pain; 05/16/2019: 5/10; 07/04/2019: 3/10    Time  6    Period  Weeks    Status  On-going      PT LONG TERM GOAL #4   Title  Patient will have no increase in pain when standing after sitting for >1hour to better be able to drive for longer periods of time.    Baseline  5 minutes of sitting    Time  6    Period  Weeks    Status  New            Plan - 07/11/19 1451    Clinical Impression Statement   Patient demonstrates significant improvement with pain after performing dry needling indicating decresae in muscular guarding. Patient able to perform pelvic tilts without pain, which has been gradually getting better, however, this is the first occausion where she had no pain at all with performance. Patient will benefit from further skilled therapy focused on improving limitations to return to prior level of function.    Personal Factors and Comorbidities  Age;Comorbidity 1    Comorbidities  hyperthyroidism    Examination-Activity Limitations  Bend;Sit    Examination-Participation Restrictions  Community Activity    Stability/Clinical Decision Making  Stable/Uncomplicated    Rehab Potential  Good    PT Frequency  2x / week    PT Duration  6 weeks    PT Treatment/Interventions  Therapeutic exercise;Therapeutic activities;Balance training;Neuromuscular re-education;Cognitive remediation;Gait training;Electrical Stimulation;Moist Heat;Stair training;Patient/family education;Dry needling;Passive range of motion;Joint Manipulations;Spinal Manipulations    PT Next Visit Plan  progress strengthening and AROM based exercises    PT Home Exercise Plan  See education section    Consulted and Agree with Plan of Care  Patient       Patient will benefit from skilled therapeutic intervention in order to improve the following deficits and impairments:  Abnormal gait, Increased fascial restricitons, Pain, Decreased mobility, Decreased coordination, Increased muscle spasms, Decreased activity tolerance, Decreased strength, Decreased range of motion, Decreased endurance, Difficulty walking  Visit Diagnosis: Acute bilateral low back pain without sciatica  Stiffness of right hip, not elsewhere classified  Stiffness of left hip, not elsewhere classified     Problem List Patient Active Problem List   Diagnosis Date Noted  . Finger infection 03/05/2019  . Skin infection 01/04/2019  . URI (upper  respiratory infection) 01/04/2019  . Healthcare maintenance 12/05/2018  . Left shoulder pain 03/12/2018  . Anxiety 08/18/2017  . Hematuria 08/16/2017  . Low back pain 01/10/2017  . Encounter for counseling 11/06/2016  . Cough 10/06/2016  . Diarrhea 10/06/2016  . Fibrocystic breast disease 05/05/2016  . Osteopenia 05/05/2016  . Cyst of pancreas 08/13/2015  . 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 Acquired hypothyroidism 08/02/2014  . Rhytides 11/20/2013  . Postmenopausal 10/13/1991    Blythe Stanford, PT DPT 07/11/2019, 3:28 PM  [facility] [address],  [place], [address] Phone: [phone]   Fax:  [phone]  Name: Sherry Bernard MRN: [id number] Date of Birth: Mar 17, 1941

## 2019-07-11 NOTE — Addendum Note (Signed)
Addended by: Blain Pais on: 07/11/2019 03:27 PM   Modules accepted: Orders

## 2019-07-12 ENCOUNTER — Other Ambulatory Visit: Payer: Self-pay | Admitting: Internal Medicine

## 2019-07-12 ENCOUNTER — Other Ambulatory Visit: Payer: Self-pay

## 2019-07-12 DIAGNOSIS — E7800 Pure hypercholesterolemia, unspecified: Secondary | ICD-10-CM

## 2019-07-12 DIAGNOSIS — M545 Low back pain, unspecified: Secondary | ICD-10-CM

## 2019-07-12 MED ORDER — ROSUVASTATIN CALCIUM 10 MG PO TABS: 10.0000 mg | ORAL_TABLET | Freq: Every day | ORAL | 1 refills | Status: DC

## 2019-07-12 NOTE — Progress Notes (Signed)
Order placed for ortho referral.  Also started on crestor.  Liver panel ordered.

## 2019-07-13 ENCOUNTER — Other Ambulatory Visit: Payer: Self-pay | Admitting: Internal Medicine

## 2019-07-15 ENCOUNTER — Encounter: Payer: Self-pay | Admitting: Internal Medicine

## 2019-07-15 NOTE — Assessment & Plan Note (Signed)
Check xray given persistent pain.  Going to therapy.

## 2019-07-15 NOTE — Assessment & Plan Note (Signed)
Stable

## 2019-07-15 NOTE — Assessment & Plan Note (Signed)
Going to therapy.  Discuss with therapy - adding PT for her shoulder.

## 2019-07-15 NOTE — Assessment & Plan Note (Signed)
On thyroid replacement.  Follow tsh.  

## 2019-07-17 ENCOUNTER — Other Ambulatory Visit: Payer: Self-pay | Admitting: Internal Medicine

## 2019-07-17 DIAGNOSIS — G8929 Other chronic pain: Secondary | ICD-10-CM

## 2019-07-17 DIAGNOSIS — M25512 Pain in left shoulder: Secondary | ICD-10-CM

## 2019-07-17 NOTE — Progress Notes (Signed)
Order placed for PT for left posterior shoulder.

## 2019-07-18 ENCOUNTER — Other Ambulatory Visit: Payer: Self-pay

## 2019-07-18 ENCOUNTER — Ambulatory Visit: Payer: PPO | Attending: Internal Medicine

## 2019-07-18 DIAGNOSIS — M545 Low back pain, unspecified: Secondary | ICD-10-CM

## 2019-07-18 DIAGNOSIS — M25651 Stiffness of right hip, not elsewhere classified: Secondary | ICD-10-CM | POA: Diagnosis not present

## 2019-07-18 DIAGNOSIS — M25512 Pain in left shoulder: Secondary | ICD-10-CM | POA: Diagnosis not present

## 2019-07-18 DIAGNOSIS — M25652 Stiffness of left hip, not elsewhere classified: Secondary | ICD-10-CM | POA: Diagnosis not present

## 2019-07-19 NOTE — Therapy (Signed)
[facility] [address] [place], [address] Phone: [phone]   Fax:  [phone]  Physical Therapy Treatment  Patient Details  Name: Sherry Bernard MRN: [id number] Date of Birth: 16-Jan-1941 Referring Provider (PT): Nicki Reaper MD   Encounter Date: 07/18/2019  PT End of Session - 07/18/19 1355    Visit Number  18    Number of Visits  21    Date for PT Re-Evaluation  08/15/19    Authorization Type  3/10    PT Start Time  [id number]    PT Stop Time  1430    PT Time Calculation (min)  45 min    Activity Tolerance  Patient tolerated treatment well;No increased pain    Behavior During Therapy  WFL for tasks assessed/performed       Past Medical History:  Diagnosis Date  . Colon polyp   . Hyperthyroidism     Past Surgical History:  Procedure Laterality Date  . APPENDECTOMY     child  . COLONOSCOPY WITH PROPOFOL N/A 06/26/2019   Procedure: COLONOSCOPY WITH PROPOFOL;  Surgeon: Lollie Sails, MD;  Location: [facility] ENDOSCOPY;  Service: Endoscopy;  Laterality: N/A;  . TONSILECTOMY, ADENOIDECTOMY, BILATERAL MYRINGOTOMY AND TUBES     child    There were no vitals filed for this visit.  Subjective Assessment - 07/18/19 1335    Subjective  Patient states no major changes since the previous session. Patient states she would like to work on addressing her shoulder pain.    Pertinent History  hyperthyroidism     Limitations  Sitting;Lifting    How long can you sit comfortably?  Hurts with going from sitting to standing - decreases after 10 min of walking     Diagnostic tests  no    Patient Stated Goals  to decrease pain    Currently in Pain?  No/denies    Pain Onset  More than a month ago       TREATMENT Therapeutic Exercise Seated pelvic tilts - x 15 Hip abduction with black TB - x 15  Standing suitcase carry - 10# in one hand and 20# in another hand  Hip extension with black TB - x 15 Deadlifts with 20#  weight - x 30 Squats in standing - x 20   Performed exercises focused on improving lumbar strength and muscular activation of the multifidus musculature  Dry Needling (1) Dry needle placed along the multifidus B with patient positioned in prone to decrease increased pain and spasms. Utilized piston techniques with .3 x 170mm needle. Patient educated on benefits and risks of treatment. Patient verbal consents to treatment.  PT Education - 07/18/19 1354    Education Details  form/technique with exercise    Person(s) Educated  Patient    Methods  Explanation;Demonstration    Comprehension  Verbalized understanding;Returned demonstration          PT Long Term Goals - 07/04/19 1638      PT LONG TERM GOAL #1   Title  Patient will be independent with HEP to continue benefits of therapy after discharge.    Baseline  Dependent with form/technique; Requires significant cueing with exercise performance; Heavy cues required for exercise performance.    Time  6    Period  Weeks    Status  On-going      PT LONG TERM GOAL #2   Title  Patient will be able to sit for >1 hour without increase  in pain to better be able to rest comfortably without increase in pain    Baseline  Increased pain after 5 to 10 min; 05/16/2019: Able to sit for 3 hours    Time  6    Period  Weeks    Status  Achieved      PT LONG TERM GOAL #3   Title  Patient will have a worst pain in the low back to under 3/10 to demonstrate improvement with functional activities.    Baseline  7/10 worst pain; 05/16/2019: 5/10; 07/04/2019: 3/10    Time  6    Period  Weeks    Status  On-going      PT LONG TERM GOAL #4   Title  Patient will have no increase in pain when standing after sitting for >1hour to better be able to drive for longer periods of time.    Baseline  5 minutes of sitting    Time  6    Period  Weeks    Status  New            Plan - 07/18/19 1638    Clinical Impression Statement  Patient demonstrates  decreased pain after performing dry needling to the multifidus indiciating decreased muscular guarding. Patient able to perform pelvic tilts without pain which is an improvement in comparison to previous sessions. Although patient is improving, she continues to have difficulties with carryover between sessions. Will address shoulder pain NV. Patient will benefit from further skilled therapy to return to prior level of function.    Personal Factors and Comorbidities  Age;Comorbidity 1    Comorbidities  hyperthyroidism    Examination-Activity Limitations  Bend;Sit    Examination-Participation Restrictions  Community Activity    Stability/Clinical Decision Making  Stable/Uncomplicated    Rehab Potential  Good    PT Frequency  2x / week    PT Duration  6 weeks    PT Treatment/Interventions  Therapeutic exercise;Therapeutic activities;Balance training;Neuromuscular re-education;Cognitive remediation;Gait training;Electrical Stimulation;Moist Heat;Stair training;Patient/family education;Dry needling;Passive range of motion;Joint Manipulations;Spinal Manipulations    PT Next Visit Plan  progress strengthening and AROM based exercises    PT Home Exercise Plan  See education section    Consulted and Agree with Plan of Care  Patient       Patient will benefit from skilled therapeutic intervention in order to improve the following deficits and impairments:  Abnormal gait, Increased fascial restricitons, Pain, Decreased mobility, Decreased coordination, Increased muscle spasms, Decreased activity tolerance, Decreased strength, Decreased range of motion, Decreased endurance, Difficulty walking  Visit Diagnosis: Acute bilateral low back pain without sciatica  Stiffness of right hip, not elsewhere classified     Problem List Patient Active Problem List   Diagnosis Date Noted  . Finger infection 03/05/2019  . Skin infection 01/04/2019  . URI (upper respiratory infection) 01/04/2019  . Healthcare  maintenance 12/05/2018  . Left shoulder pain 03/12/2018  . Anxiety 08/18/2017  . Hematuria 08/16/2017  . Low back pain 01/10/2017  . Encounter for counseling 11/06/2016  . Cough 10/06/2016  . Diarrhea 10/06/2016  . Fibrocystic breast disease 05/05/2016  . Osteopenia 05/05/2016  . Cyst of pancreas 08/13/2015  . 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 Acquired hypothyroidism 08/02/2014  . Rhytides 11/20/2013  . Postmenopausal 10/13/1991    Blythe Stanford, PT DPT 07/19/2019, 9:11 AM  [facility] [address], [place], [address] Phone: [phone]   Fax:  [phone]  Name:  ZARIA FOSBERG MRN: [id number] Date of Birth: [birth year]

## 2019-07-20 ENCOUNTER — Telehealth: Payer: Self-pay

## 2019-07-20 NOTE — Telephone Encounter (Signed)
Copied from [place] [id number]. Topic: Referral - Status >> Jul 20, 2019  3:15 PM Scherrie Gerlach wrote: Reason for CRM: the referral was sent in proficient to Dr Roland Rack.  But Ebony Hail states she does not think they were to get if it is PT. Appears to have gone to the work place. Please advise

## 2019-07-25 ENCOUNTER — Other Ambulatory Visit: Payer: Self-pay

## 2019-07-25 ENCOUNTER — Ambulatory Visit: Payer: PPO

## 2019-07-25 DIAGNOSIS — M25512 Pain in left shoulder: Secondary | ICD-10-CM

## 2019-07-25 DIAGNOSIS — M25651 Stiffness of right hip, not elsewhere classified: Secondary | ICD-10-CM

## 2019-07-25 DIAGNOSIS — M545 Low back pain, unspecified: Secondary | ICD-10-CM

## 2019-07-25 NOTE — Telephone Encounter (Signed)
Called to get more info but Sherry Bernard was not available.

## 2019-07-26 NOTE — Therapy (Signed)
[facility] [address] [place], [address] Phone: [phone]   Fax:  [phone]  Physical Therapy Evaluation  Patient Details  Name: Sherry Bernard MRN: [id number] Date of Birth: 1941/03/10 Referring Provider (PT): Nicki Reaper MD   Encounter Date: 07/25/2019  PT End of Session - 07/25/19 1509    Visit Number  19    Number of Visits  33    Date for PT Re-Evaluation  08/15/19    Authorization Type  4/10    PT Start Time  1300    PT Stop Time  1345    PT Time Calculation (min)  45 min    Activity Tolerance  Patient tolerated treatment well;No increased pain    Behavior During Therapy  WFL for tasks assessed/performed       Past Medical History:  Diagnosis Date  . Colon polyp   . Hyperthyroidism     Past Surgical History:  Procedure Laterality Date  . APPENDECTOMY     child  . COLONOSCOPY WITH PROPOFOL N/A 06/26/2019   Procedure: COLONOSCOPY WITH PROPOFOL;  Surgeon: Lollie Sails, MD;  Location: [facility] ENDOSCOPY;  Service: Endoscopy;  Laterality: N/A;  . TONSILECTOMY, ADENOIDECTOMY, BILATERAL MYRINGOTOMY AND TUBES     child    There were no vitals filed for this visit.   Subjective Assessment - 07/25/19 1452    Subjective  Patient reports increased L shoulder pain most notably with performing overhead reaching (avoids placing items on high shelves and uses R shoulder to perform). Patient states she had increased difficulty with lifitng heavier weights (unable to do frequent overhead movement with weight) which limits her abilityto perform recreational activity. Patient states improvement since onset over 3 years ago but has not been significantly improving over the past 1 year. 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 Patient states she has not tried many interventions to decrease her pain. Patient reports she originally hurt her shoulder 3 years ago when working with a  Physiological scientist and had a sharp pain after doing an overhead lift resulting in increased ecchymosis. Patient reports she would like to improve her pain.    Pertinent History  hyperthyroidism     Limitations  Sitting;Lifting    How long can you sit comfortably?  Hurts with going from sitting to standing - decreases after 10 min of walking     Diagnostic tests  no    Patient Stated Goals  to decrease pain    Currently in Pain?  No/denies    Pain Onset  More than a month ago         [facility] PT Assessment - 07/25/19 1826      Assessment   Medical Diagnosis  L shoulder    Referring Provider (PT)  Nicki Reaper MD    Onset Date/Surgical Date  10/12/16    Hand Dominance  Left    Next MD Visit  unknown    Prior Therapy  yes for lumbar      Balance Screen   Has the patient fallen in the past 6 months  No    Has the patient had a decrease in activity level because of a fear of falling?   No    Is the patient reluctant to leave their home because of a fear of falling?   No      Home Film/video editor residence    Living Arrangements  Spouse/significant other    Available Help at Discharge  Family    Type of Hill to enter    Entrance Stairs-Number of Steps  15    Entrance Stairs-Rails  Right    Home Layout  Two level      Prior Function   Level of Independence  Independent    Vocation  Retired    [employer]  N/a    Leisure  work out, walk, travel, grand children      Cognition   Overall Cognitive Status  Within Functional Limits for tasks assessed      Observation/Other Assessments   Observations  Abducted shoulder blades in sitting      Sensation   Light Touch  Appears Intact      Functional Tests   Functional tests  --      Posture/Postural Control   Posture Comments  FHP, minimal kyphosis, forward rounded shoulders      AROM   AROM Assessment Site  Cervical;Thoracic;Shoulder    Right/Left Shoulder  Right;Left     Right Shoulder Flexion  165 Degrees    Right Shoulder ABduction  165 Degrees    Right Shoulder Internal Rotation  80 Degrees    Right Shoulder External Rotation  75 Degrees    Left Shoulder Flexion  125 Degrees    Left Shoulder ABduction  125 Degrees    Left Shoulder Internal Rotation  50 Degrees    Left Shoulder External Rotation  80 Degrees    Cervical Flexion  WNL    Cervical Extension  50% limited    Cervical - Right Side Bend  66% limited    Cervical - Left Side Bend  66% limited    Cervical - Right Rotation  50% limited    Cervical - Left Rotation  50% limited    Thoracic Flexion  WNL    Thoracic Extension  80% limited    Thoracic - Right Rotation  50% limited    Thoracic - Left Rotation  50% limited      Strength   Strength Assessment Site  Shoulder;Elbow    Right/Left Shoulder  Right;Left    Right Shoulder Flexion  5/5    Right Shoulder ABduction  5/5    Right Shoulder Internal Rotation  5/5    Right Shoulder External Rotation  4+/5    Left Shoulder Flexion  4-/5    Left Shoulder ABduction  4/5    Left Shoulder Internal Rotation  5/5    Left Shoulder External Rotation  4-/5    Right/Left Elbow  Right;Left    Right Elbow Flexion  5/5    Right Elbow Extension  5/5    Left Elbow Flexion  5/5    Left Elbow Extension  5/5      Palpation   Palpation comment  TTP: along the deltoid, periscapular musculature, infraspinatus/supraspinatus      Special Tests    Special Tests  Rotator Cuff Impingement    Rotator Cuff Impingment tests  Hawkins- Kennedy test;Painful Arc of Motion;Neer impingement test      Neer Impingement test    Findings  Negative      Hawkins-Kennedy test   Findings  Negative      Painful Arc of Motion   Findings  Negative       Objective measurements completed on examination: See above findings.   TREATMENT Therapeutic Exercise Thoracic extension/flexion in sitting -- x 10  B shoulder ER with YTB in sitting -- x 15 B shoulder flexion with  use of yellow physioball -- x 10   Performed exercises to improve overhead shoulder motion.     PT Education - 07/25/19 1508    Education Details  shoulder flexion B on wall; B SHoulder ER in sitting added to HEP; form/technique with exercise    Person(s) Educated  Patient    Methods  Explanation;Demonstration    Comprehension  Verbalized understanding;Returned demonstration          PT Long Term Goals - 07/25/19 1516      PT LONG TERM GOAL #1   Title  Patient will be independent with HEP to continue benefits of therapy after discharge.    Baseline  Dependent with form/technique; Requires significant cueing with exercise performance; Heavy cues required for exercise performance.    Time  6    Period  Weeks    Status  On-going      PT LONG TERM GOAL #2   Title  Patient will be able to sit for >1 hour without increase in pain to better be able to rest comfortably without increase in pain    Baseline  Increased pain after 5 to 10 min; 05/16/2019: Able to sit for 3 hours    Time  6    Period  Weeks    Status  Achieved      PT LONG TERM GOAL #3   Title  Patient will have a worst pain in the low back to under 3/10 to demonstrate improvement with functional activities.    Baseline  7/10 worst pain; 05/16/2019: 5/10; 07/04/2019: 3/10    Time  6    Period  Weeks    Status  On-going      PT LONG TERM GOAL #4   Title  Patient will have no increase in pain when standing after sitting for >1hour to better be able to drive for longer periods of time.    Baseline  5 minutes of sitting    Time  6    Period  Weeks    Status  New      PT LONG TERM GOAL #5   Title  Patient will be able to perform overhead shoulder flexion to AROM to reach the unaffected side around (155 degrees of flexion) to reach the higher shelf.    Baseline  125 degrees of flexion AROM    Time  6    Period  Weeks    Status  New      Additional Long Term Goals   Additional Long Term Goals  Yes      PT LONG TERM  GOAL #6   Title  Patient will have a worst pain of 2/10 with overhead shoulder flexion to better be able to lift items overhead.    Baseline  5/10    Time  6    Period  Weeks    Status  New      PT LONG TERM GOAL #7   Title  Patient will be able to perform resisted  L shoulder ER to the same extent as the unaffected side to better be able to perform overhead lifting.    Baseline  4/5 on the L shoulder    Time  6    Period  Weeks    Status  New             Plan - 07/25/19 1509    Clinical  Impression Statement  Patient is a 78 yo left hand dominant female presenting with increased L shoulder pain most notably with performing overhead motion. Patient demonstrates increased L shoulder dysfunction as indicated by increased pain with performing end range shoulder flexion/abduction and decreased strength with performing shoulder ER in standing. Patient also demonstrate poor strength with shoulder ER and flexion. Patient will benefit from further skilled therapy to return to prior level of function.    Personal Factors and Comorbidities  Age;Comorbidity 1    Comorbidities  hyperthyroidism    Examination-Activity Limitations  Bend;Sit    Examination-Participation Restrictions  Community Activity    Stability/Clinical Decision Making  Stable/Uncomplicated    Rehab Potential  Good    PT Frequency  2x / week    PT Duration  6 weeks    PT Treatment/Interventions  Therapeutic exercise;Therapeutic activities;Balance training;Neuromuscular re-education;Cognitive remediation;Gait training;Electrical Stimulation;Moist Heat;Stair training;Patient/family education;Dry needling;Passive range of motion;Joint Manipulations;Spinal Manipulations    PT Next Visit Plan  progress strengthening and AROM based exercises    PT Home Exercise Plan  See education section    Consulted and Agree with Plan of Care  Patient       Patient will benefit from skilled therapeutic intervention in order to improve the  following deficits and impairments:  Abnormal gait, Increased fascial restricitons, Pain, Decreased mobility, Decreased coordination, Increased muscle spasms, Decreased activity tolerance, Decreased strength, Decreased range of motion, Decreased endurance, Difficulty walking  Visit Diagnosis: Acute bilateral low back pain without sciatica  Stiffness of right hip, not elsewhere classified  Left shoulder pain, unspecified chronicity     Problem List Patient Active Problem List   Diagnosis Date Noted  . Finger infection 03/05/2019  . Skin infection 01/04/2019  . URI (upper respiratory infection) 01/04/2019  . Healthcare maintenance 12/05/2018  . Left shoulder pain 03/12/2018  . Anxiety 08/18/2017  . Hematuria 08/16/2017  . Low back pain 01/10/2017  . Encounter for counseling 11/06/2016  . Cough 10/06/2016  . Diarrhea 10/06/2016  . Fibrocystic breast disease 05/05/2016  . Osteopenia 05/05/2016  . Cyst of pancreas 08/13/2015  . 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 Acquired hypothyroidism 08/02/2014  . Rhytides 11/20/2013  . Postmenopausal 10/13/1991    Blythe Stanford, PT DPT 07/26/2019, 9:23 AM  [facility] [address] [place], [address] Phone: [phone]   Fax:  [phone]  Name: Sherry Bernard MRN: [id number] Date of Birth: [birth year]

## 2019-08-02 NOTE — Telephone Encounter (Signed)
Referral is being resent so she can get scheduled

## 2019-08-04 ENCOUNTER — Other Ambulatory Visit: Payer: Self-pay

## 2019-08-04 ENCOUNTER — Ambulatory Visit (INDEPENDENT_AMBULATORY_CARE_PROVIDER_SITE_OTHER): Payer: Self-pay | Admitting: Plastic Surgery

## 2019-08-04 ENCOUNTER — Encounter: Payer: Self-pay | Admitting: Plastic Surgery

## 2019-08-04 VITALS — BP 107/63 | HR 84 | Temp 97.1°F | Ht 63.4 in | Wt 124.0 lb

## 2019-08-04 DIAGNOSIS — Z719 Counseling, unspecified: Secondary | ICD-10-CM

## 2019-08-04 NOTE — Progress Notes (Addendum)
Botulinum Toxin Procedure Note  Procedure: Cosmetic botulinum toxin   Pre-operative Diagnosis: Dynamic rhytides   Post-operative Diagnosis: Same  Complications:  None  Brief history: The patient desires botulinum toxin injection of her forehead. I discussed with the patient this proposed procedure of botulinum toxin injections, which is customized depending on the particular needs of the patient. It is performed on facial rhytids as a temporary correction. The alternatives were discussed with the patient. The risks were addressed including bleeding, scarring, infection, damage to deeper structures, asymmetry, and chronic pain, which may occur infrequently after a procedure. The individual's choice to undergo a surgical procedure is based on the comparison of risks to potential benefits. Other risks include unsatisfactory results, brow ptosis, eyelid ptosis, allergic reaction, temporary paralysis, which should go away with time, bruising, blurring disturbances and delayed healing. Botulinum toxin injections do not arrest the aging process or produce permanent tightening of the eyelid.  Operative intervention maybe necessary to maintain the results of a blepharoplasty or botulinum toxin. The patient understands and wishes to proceed. An informed consent was signed and informational brochures given to her prior to the procedure.  Procedure: The area was prepped with alcohol and dried with a clean gauze. Using a clean technique, the botulinum toxin was diluted with 1.25 cc of preservative-free normal saline which was slowly injected with an 18 gauge needle in a tuberculin syringes.  A 32 gauge needles were then used to inject the botulinum toxin. This mixture allow for an aliquot of 5 units per 0.1 cc in each injection site.    Subsequently the mixture was injected in the glabellar and forehead area with preservation of the temporal branch to the lateral eyebrow as well as into each lateral canthal area  beginning from the lateral orbital rim medial to the zygomaticus major in 3 separate areas. A total of 30 Units of botulinum toxin was used. The forehead and glabellar area was injected with care to inject intramuscular only while holding pressure on the supratrochlear vessels in each area during each injection on either side of the medial corrugators. The injection proceeded vertically superiorly to the medial 2/3 of the frontalis muscle and superior 2/3 of the lateral frontalis, again with preservation of the frontal branch.  No complications were noted. Light pressure was held for 5 minutes. She was instructed explicitly in post-operative care.  Botox LOT:  [id number] C2 EXP:  5/23

## 2019-08-08 ENCOUNTER — Other Ambulatory Visit: Payer: Self-pay

## 2019-08-08 ENCOUNTER — Ambulatory Visit: Payer: PPO

## 2019-08-08 DIAGNOSIS — M25512 Pain in left shoulder: Secondary | ICD-10-CM

## 2019-08-08 DIAGNOSIS — M25651 Stiffness of right hip, not elsewhere classified: Secondary | ICD-10-CM

## 2019-08-08 DIAGNOSIS — M25652 Stiffness of left hip, not elsewhere classified: Secondary | ICD-10-CM

## 2019-08-08 DIAGNOSIS — M545 Low back pain, unspecified: Secondary | ICD-10-CM

## 2019-08-08 NOTE — Therapy (Signed)
[facility] [address], [place], [address] Phone: [phone]   Fax:  [phone]  Physical Therapy Treatment  Patient Details  Name: Sherry Bernard MRN: [id number] Date of Birth: 09-17-1941 Referring Provider (PT): Nicki Reaper MD   Encounter Date: 08/08/2019  PT End of Session - 08/08/19 1435    Visit Number  20    Number of Visits  33    Date for PT Re-Evaluation  08/15/19    Authorization Type  4/10    PT Start Time  [id number]    PT Stop Time  1430    PT Time Calculation (min)  31 min    Activity Tolerance  Patient tolerated treatment well;No increased pain    Behavior During Therapy  WFL for tasks assessed/performed       Past Medical History:  Diagnosis Date  . Colon polyp   . Hyperthyroidism     Past Surgical History:  Procedure Laterality Date  . APPENDECTOMY     child  . COLONOSCOPY WITH PROPOFOL N/A 06/26/2019   Procedure: COLONOSCOPY WITH PROPOFOL;  Surgeon: Lollie Sails, MD;  [facility] ENDOSCOPY;  Service: Endoscopy;  Laterality: N/A;  . TONSILECTOMY, ADENOIDECTOMY, BILATERAL MYRINGOTOMY AND TUBES     child    There were no vitals filed for this visit.  Subjective Assessment - 08/08/19 1400    Subjective  Patient reports good vacation but bothered by going up and down steps. Reports that once she gets moving she is not in pain.    Pertinent History  hyperthyroidism     Limitations  Sitting;Lifting    How long can you sit comfortably?  Hurts with going from sitting to standing - decreases after 10 min of walking     Diagnostic tests  no    Patient Stated Goals  to decrease pain    Currently in Pain?  No/denies    Pain Onset  More than a month ago       TREATMENT  Pre-treatment R shld flexion, abd WNL; L limited grossly approx. 20 deg.  MT GH glides inf post grades 2-3 30 glides MWM ER with sustained posterior glide  MT for increased range and decreased pain in  shoulder  TE Low rows on ball green theraband 3 x 10 for core/scapular stability Pelvic tilts anterior/posterior and lateral on ball for dynamic core stability and lumbopelvic dissociation Standing shoulder flexion green theraband 2 x 10 for increased strength within full ROM Standing shoulder abd green theraband 2 x 10 for increased strength within full ROM Standing shoulder D1 & D2 red theraband x 10 each for multiplanar shoulder strengthening Standing shoulder horizontal abduction red theraband with cues for scapular muscle activation Shoulder ER with red theraband x 10 for postural correction - added to HEP Cervical retraction in standing x 10 Cervical retraction in standing against ball resistance - cues for DNF activation but compensation with lumbar ext cannot be corrected Supine cervical retraction into towel roll x 10  HEP Red TB ER Green TB Flexion   Post-tx: R and L shld WNL for flexion, abd, ER    PT Education - 08/08/19 1434    Education Details  form/technique with therex; HEP: band EROT, standing shld flexion    Person(s) Educated  Patient    Methods  Explanation;Demonstration    Comprehension  Verbalized understanding;Returned demonstration;Verbal cues required;Tactile cues required          PT Long Term Goals -  07/25/19 1516      PT LONG TERM GOAL #1   Title  Patient will be independent with HEP to continue benefits of therapy after discharge.    Baseline  Dependent with form/technique; Requires significant cueing with exercise performance; Heavy cues required for exercise performance.    Time  6    Period  Weeks    Status  On-going      PT LONG TERM GOAL #2   Title  Patient will be able to sit for >1 hour without increase in pain to better be able to rest comfortably without increase in pain    Baseline  Increased pain after 5 to 10 min; 05/16/2019: Able to sit for 3 hours    Time  6    Period  Weeks    Status  Achieved      PT LONG TERM GOAL #3    Title  Patient will have a worst pain in the low back to under 3/10 to demonstrate improvement with functional activities.    Baseline  7/10 worst pain; 05/16/2019: 5/10; 07/04/2019: 3/10    Time  6    Period  Weeks    Status  On-going      PT LONG TERM GOAL #4   Title  Patient will have no increase in pain when standing after sitting for >1hour to better be able to drive for longer periods of time.    Baseline  5 minutes of sitting    Time  6    Period  Weeks    Status  New      PT LONG TERM GOAL #5   Title  Patient will be able to perform overhead shoulder flexion to AROM to reach the unaffected side around (155 degrees of flexion) to reach the higher shelf.    Baseline  125 degrees of flexion AROM    Time  6    Period  Weeks    Status  New      Additional Long Term Goals   Additional Long Term Goals  Yes      PT LONG TERM GOAL #6   Title  Patient will have a worst pain of 2/10 with overhead shoulder flexion to better be able to lift items overhead.    Baseline  5/10    Time  6    Period  Weeks    Status  New      PT LONG TERM GOAL #7   Title  Patient will be able to perform resisted  L shoulder ER to the same extent as the unaffected side to better be able to perform overhead lifting.    Baseline  4/5 on the L shoulder    Time  6    Period  Weeks    Status  New            Plan - 08/08/19 1436    Clinical Impression Statement  Treatment limited by late arrival to therapy. Patient demonstrates good response to [facility] joint mobilizations attaining equal flexion, abduction, and ER bilaterally and tolerating shoulder exercises well. POC to focus on neck and low back with postural exercises to correct forward head/rounded shoulders and improve dynamic core control. Patient will benefit from skilled physical therapy to return to PLOF.    Personal Factors and Comorbidities  Age;Comorbidity 1    Comorbidities  hyperthyroidism    Examination-Activity Limitations  Bend;Sit     Examination-Participation Restrictions  Community Activity    Stability/Clinical  Decision Making  Stable/Uncomplicated    Rehab Potential  Good    PT Frequency  2x / week    PT Duration  6 weeks    PT Treatment/Interventions  Therapeutic exercise;Therapeutic activities;Balance training;Neuromuscular re-education;Cognitive remediation;Gait training;Electrical Stimulation;Moist Heat;Stair training;Patient/family education;Dry needling;Passive range of motion;Joint Manipulations;Spinal Manipulations    PT Next Visit Plan  progress strengthening and AROM based exercises    PT Home Exercise Plan  See education section    Consulted and Agree with Plan of Care  Patient       Patient will benefit from skilled therapeutic intervention in order to improve the following deficits and impairments:  Abnormal gait, Increased fascial restricitons, Pain, Decreased mobility, Decreased coordination, Increased muscle spasms, Decreased activity tolerance, Decreased strength, Decreased range of motion, Decreased endurance, Difficulty walking  Visit Diagnosis: Acute bilateral low back pain without sciatica  Stiffness of right hip, not elsewhere classified  Left shoulder pain, unspecified chronicity  Stiffness of left hip, not elsewhere classified     Problem List Patient Active Problem List   Diagnosis Date Noted  . Finger infection 03/05/2019  . Skin infection 01/04/2019  . URI (upper respiratory infection) 01/04/2019  . Healthcare maintenance 12/05/2018  . Left shoulder pain 03/12/2018  . Anxiety 08/18/2017  . Hematuria 08/16/2017  . Low back pain 01/10/2017  . Encounter for counseling 11/06/2016  . Cough 10/06/2016  . Diarrhea 10/06/2016  . Fibrocystic breast disease 05/05/2016  . Osteopenia 05/05/2016  . Cyst of pancreas 08/13/2015  . 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 Acquired hypothyroidism 08/02/2014  . Rhytides 11/20/2013  . Postmenopausal 10/13/1991    Virgia Land,  SPT 08/08/2019, 2:42 PM  [facility] [address] [place], [address] Phone: [phone]   Fax:  [phone]  Name: OKALANI SILFIES MRN: [id number] Date of Birth: [birth year]

## 2019-08-09 DIAGNOSIS — Z0000 Encounter for general adult medical examination without abnormal findings: Secondary | ICD-10-CM | POA: Diagnosis not present

## 2019-08-09 DIAGNOSIS — N6019 Diffuse cystic mastopathy of unspecified breast: Secondary | ICD-10-CM | POA: Diagnosis not present

## 2019-08-09 DIAGNOSIS — Z780 Asymptomatic menopausal state: Secondary | ICD-10-CM | POA: Diagnosis not present

## 2019-08-09 DIAGNOSIS — M8580 Other specified disorders of bone density and structure, unspecified site: Secondary | ICD-10-CM | POA: Diagnosis not present

## 2019-08-09 DIAGNOSIS — K862 Cyst of pancreas: Secondary | ICD-10-CM | POA: Diagnosis not present

## 2019-08-09 DIAGNOSIS — D126 Benign neoplasm of colon, unspecified: Secondary | ICD-10-CM | POA: Diagnosis not present

## 2019-08-09 DIAGNOSIS — J984 Other disorders of lung: Secondary | ICD-10-CM | POA: Diagnosis not present

## 2019-08-09 DIAGNOSIS — Z13220 Encounter for screening for lipoid disorders: Secondary | ICD-10-CM | POA: Diagnosis not present

## 2019-08-09 DIAGNOSIS — Z136 Encounter for screening for cardiovascular disorders: Secondary | ICD-10-CM | POA: Diagnosis not present

## 2019-08-09 DIAGNOSIS — R918 Other nonspecific abnormal finding of lung field: Secondary | ICD-10-CM | POA: Diagnosis not present

## 2019-08-09 DIAGNOSIS — E039 Hypothyroidism, unspecified: Secondary | ICD-10-CM | POA: Diagnosis not present

## 2019-08-09 DIAGNOSIS — Z131 Encounter for screening for diabetes mellitus: Secondary | ICD-10-CM | POA: Diagnosis not present

## 2019-08-15 ENCOUNTER — Other Ambulatory Visit: Payer: Self-pay

## 2019-08-15 ENCOUNTER — Ambulatory Visit: Payer: PPO | Attending: Internal Medicine

## 2019-08-15 DIAGNOSIS — M25651 Stiffness of right hip, not elsewhere classified: Secondary | ICD-10-CM | POA: Diagnosis not present

## 2019-08-15 DIAGNOSIS — M545 Low back pain, unspecified: Secondary | ICD-10-CM

## 2019-08-15 DIAGNOSIS — M25512 Pain in left shoulder: Secondary | ICD-10-CM | POA: Insufficient documentation

## 2019-08-15 DIAGNOSIS — M25652 Stiffness of left hip, not elsewhere classified: Secondary | ICD-10-CM | POA: Insufficient documentation

## 2019-08-15 NOTE — Therapy (Signed)
[facility] [address], [place], [address] Phone: [phone]   Fax:  [phone]  Physical Therapy Treatment  Patient Details  Name: Sherry Bernard MRN: [id number] Date of Birth: 10-04-1941 Referring Provider (PT): Nicki Reaper MD   Encounter Date: 08/15/2019  PT End of Session - 08/15/19 1347    Visit Number  21    Number of Visits  33    Date for PT Re-Evaluation  08/15/19    Authorization Type  6/10    PT Start Time  1301    PT Stop Time  1345    PT Time Calculation (min)  44 min    Activity Tolerance  Patient tolerated treatment well;No increased pain    Behavior During Therapy  WFL for tasks assessed/performed       Past Medical History:  Diagnosis Date  . Colon polyp   . Hyperthyroidism     Past Surgical History:  Procedure Laterality Date  . APPENDECTOMY     child  . COLONOSCOPY WITH PROPOFOL N/A 06/26/2019   Procedure: COLONOSCOPY WITH PROPOFOL;  Surgeon: Lollie Sails, MD;  Location: [facility] ENDOSCOPY;  Service: Endoscopy;  Laterality: N/A;  . TONSILECTOMY, ADENOIDECTOMY, BILATERAL MYRINGOTOMY AND TUBES     child    There were no vitals filed for this visit.  Subjective Assessment - 08/15/19 1303    Subjective  Patient reports soreness following last exercise session but recovered well. Back pain currently mild.    Pertinent History  hyperthyroidism     Limitations  Sitting;Lifting    How long can you sit comfortably?  Hurts with going from sitting to standing - decreases after 10 min of walking     Diagnostic tests  no    Patient Stated Goals  to decrease pain    Currently in Pain?  Yes    Pain Score  5     Pain Location  Back    Pain Orientation  Lower    Pain Descriptors / Indicators  Aching    Pain Type  Chronic pain    Pain Onset  More than a month ago    Multiple Pain Sites  No      TREATMENT   MT ART to pectorals - no tissue response TrP release to pectorals - reduced  tissue tension noted following treatment  TE Rows green TB 2 x 10 Straight arm extension from flexed position red theraband for inferior/middle traps 2 x 10 Serratus pushups against wall x 10 Serratus pushups modified quad 2 x 10 Lat pull downs 20# x 20, 25# x 20 Lateral side bending stretch x 30 sec R/L Thoracic rotation R/L x 10 patient demonstrates reduced thoracolumbar dissociation suggestive of reduced segmental rotation LTR with DKTC STS/squat 2 x 10 Lower trunk extension in standing for relief of LBP Deadlifts 5# x 10  TE for reduction of low back pain, improved functional capacity, and improved postural alignment via improved scapular positioning and thoracic spine mobility    PT Education - 08/15/19 1327    Education Details  form/technique with therex    Person(s) Educated  Patient    Methods  Explanation;Demonstration    Comprehension  Verbalized understanding;Returned demonstration;Tactile cues required;Verbal cues required          PT Long Term Goals - 07/25/19 1516      PT LONG TERM GOAL #1   Title  Patient will be independent with HEP to continue benefits of therapy  after discharge.    Baseline  Dependent with form/technique; Requires significant cueing with exercise performance; Heavy cues required for exercise performance.    Time  6    Period  Weeks    Status  On-going      PT LONG TERM GOAL #2   Title  Patient will be able to sit for >1 hour without increase in pain to better be able to rest comfortably without increase in pain    Baseline  Increased pain after 5 to 10 min; 05/16/2019: Able to sit for 3 hours    Time  6    Period  Weeks    Status  Achieved      PT LONG TERM GOAL #3   Title  Patient will have a worst pain in the low back to under 3/10 to demonstrate improvement with functional activities.    Baseline  7/10 worst pain; 05/16/2019: 5/10; 07/04/2019: 3/10    Time  6    Period  Weeks    Status  On-going      PT LONG TERM GOAL #4   Title   Patient will have no increase in pain when standing after sitting for >1hour to better be able to drive for longer periods of time.    Baseline  5 minutes of sitting    Time  6    Period  Weeks    Status  New      PT LONG TERM GOAL #5   Title  Patient will be able to perform overhead shoulder flexion to AROM to reach the unaffected side around (155 degrees of flexion) to reach the higher shelf.    Baseline  125 degrees of flexion AROM    Time  6    Period  Weeks    Status  New      Additional Long Term Goals   Additional Long Term Goals  Yes      PT LONG TERM GOAL #6   Title  Patient will have a worst pain of 2/10 with overhead shoulder flexion to better be able to lift items overhead.    Baseline  5/10    Time  6    Period  Weeks    Status  New      PT LONG TERM GOAL #7   Title  Patient will be able to perform resisted  L shoulder ER to the same extent as the unaffected side to better be able to perform overhead lifting.    Baseline  4/5 on the L shoulder    Time  6    Period  Weeks    Status  New            Plan - 08/15/19 1537    Clinical Impression Statement  Patient demonstrates mild focal tightness in pectorals, reduced with manual therapy and not limiting exercise. Patient demonstrates good tolerance with UE/core exercises resulting in reduced LBP at end of session and improved postural alignment. Deficits remain in functional capacity and forward head posture. Patient will benefit from skilled physical therapy to return to PLOF.    Personal Factors and Comorbidities  Age;Comorbidity 1    Comorbidities  hyperthyroidism    Examination-Activity Limitations  Bend;Sit    Examination-Participation Restrictions  Community Activity    Stability/Clinical Decision Making  Stable/Uncomplicated    Rehab Potential  Good    PT Frequency  2x / week    PT Duration  6 weeks    PT Treatment/Interventions  Therapeutic exercise;Therapeutic activities;Balance training;Neuromuscular  re-education;Cognitive remediation;Gait training;Electrical Stimulation;Moist Heat;Stair training;Patient/family education;Dry needling;Passive range of motion;Joint Manipulations;Spinal Manipulations    PT Next Visit Plan  progress strengthening and AROM based exercises    PT Home Exercise Plan  See education section    Consulted and Agree with Plan of Care  Patient       Patient will benefit from skilled therapeutic intervention in order to improve the following deficits and impairments:  Abnormal gait, Increased fascial restricitons, Pain, Decreased mobility, Decreased coordination, Increased muscle spasms, Decreased activity tolerance, Decreased strength, Decreased range of motion, Decreased endurance, Difficulty walking  Visit Diagnosis: Acute bilateral low back pain without sciatica  Stiffness of right hip, not elsewhere classified  Left shoulder pain, unspecified chronicity  Stiffness of left hip, not elsewhere classified     Problem List Patient Active Problem List   Diagnosis Date Noted  . Finger infection 03/05/2019  . Skin infection 01/04/2019  . URI (upper respiratory infection) 01/04/2019  . Healthcare maintenance 12/05/2018  . Left shoulder pain 03/12/2018  . Anxiety 08/18/2017  . Hematuria 08/16/2017  . Low back pain 01/10/2017  . Encounter for counseling 11/06/2016  . Cough 10/06/2016  . Diarrhea 10/06/2016  . Fibrocystic breast disease 05/05/2016  . Osteopenia 05/05/2016  . Cyst of pancreas 08/13/2015  . 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 Acquired hypothyroidism 08/02/2014  . Rhytides 11/20/2013  . Postmenopausal 10/13/1991    Virgia Land, SPT 08/15/2019, 3:43 PM  [facility] [address] [place], [address] Phone: [phone]   Fax:  [phone]  Name: TABBATHA FLOSS MRN: [id number] Date of Birth: 03/22/1941

## 2019-08-22 ENCOUNTER — Other Ambulatory Visit: Payer: Self-pay

## 2019-08-22 ENCOUNTER — Ambulatory Visit: Payer: PPO

## 2019-08-22 DIAGNOSIS — M25652 Stiffness of left hip, not elsewhere classified: Secondary | ICD-10-CM

## 2019-08-22 DIAGNOSIS — M545 Low back pain, unspecified: Secondary | ICD-10-CM

## 2019-08-22 DIAGNOSIS — M25512 Pain in left shoulder: Secondary | ICD-10-CM

## 2019-08-22 DIAGNOSIS — M25651 Stiffness of right hip, not elsewhere classified: Secondary | ICD-10-CM

## 2019-08-23 NOTE — Therapy (Signed)
[facility] [address] [place], [address] Phone: [phone]   Fax:  [phone]  Physical Therapy Treatment  Patient Details  Name: Sherry Bernard MRN: [id number] Date of Birth: 06/05/1941 Referring Provider (PT): Nicki Reaper MD   Encounter Date: 08/22/2019  PT End of Session - 08/22/19 1430    Visit Number  22    Number of Visits  33    Date for PT Re-Evaluation  08/15/19    Authorization Type  7/10    PT Start Time  [id number]    PT Stop Time  1600    PT Time Calculation (min)  44 min    Activity Tolerance  Patient tolerated treatment well;No increased pain    Behavior During Therapy  WFL for tasks assessed/performed       Past Medical History:  Diagnosis Date  . Colon polyp   . Hyperthyroidism     Past Surgical History:  Procedure Laterality Date  . APPENDECTOMY     child  . COLONOSCOPY WITH PROPOFOL N/A 06/26/2019   Procedure: COLONOSCOPY WITH PROPOFOL;  Surgeon: Lollie Sails, MD;  Location[facility] ENDOSCOPY;  Service: Endoscopy;  Laterality: N/A;  . TONSILECTOMY, ADENOIDECTOMY, BILATERAL MYRINGOTOMY AND TUBES     child    There were no vitals filed for this visit.  Subjective Assessment - 08/22/19 1520    Subjective  Patient reports no changes since last visit.    Pertinent History  hyperthyroidism     Limitations  Sitting;Lifting    How long can you sit comfortably?  Hurts with going from sitting to standing - decreases after 10 min of walking     Diagnostic tests  no    Patient Stated Goals  to decrease pain    Currently in Pain?  Yes    Pain Score  5     Pain Location  Back    Pain Orientation  Lower    Pain Descriptors / Indicators  Aching    Pain Type  Chronic pain    Pain Onset  More than a month ago         TREATMENT  MT Myofascial release and trigger point release to R biceps muscle belly and R pectorals Myofascial release to QL, obliques, lumbar paraspinals  MT for  relief of pain and improved tissue extensibility    TE Standing lumbar extension x 10 Standing shoulder horizontal abduction red theraband x 20  Standing shoulder bilateral EROT with red theraband x 20 Doorway stretch 3 x 30 sec Ulnar stretch following pt complaint of "funny bone" pain x 30 sec SLS pelvic depression x 20 Bridges segmental x 20 with red tband around knees for abductor activation and stabilization LTRs x 20 R/L with cues for thoracolumbar dissociation  TE for improved shoulder strength and activity tolerance and core control for reduction of LBP      PT Education - 08/22/19 1519    Education Details  form/technique with exercise    Person(s) Educated  Patient    Methods  Explanation;Demonstration;Tactile cues;Verbal cues    Comprehension  Verbalized understanding;Returned demonstration;Verbal cues required;Tactile cues required          PT Long Term Goals - 07/25/19 1516      PT LONG TERM GOAL #1   Title  Patient will be independent with HEP to continue benefits of therapy after discharge.    Baseline  Dependent with form/technique; Requires significant cueing with exercise performance; Heavy  cues required for exercise performance.    Time  6    Period  Weeks    Status  On-going      PT LONG TERM GOAL #2   Title  Patient will be able to sit for >1 hour without increase in pain to better be able to rest comfortably without increase in pain    Baseline  Increased pain after 5 to 10 min; 05/16/2019: Able to sit for 3 hours    Time  6    Period  Weeks    Status  Achieved      PT LONG TERM GOAL #3   Title  Patient will have a worst pain in the low back to under 3/10 to demonstrate improvement with functional activities.    Baseline  7/10 worst pain; 05/16/2019: 5/10; 07/04/2019: 3/10    Time  6    Period  Weeks    Status  On-going      PT LONG TERM GOAL #4   Title  Patient will have no increase in pain when standing after sitting for >1hour to better be able  to drive for longer periods of time.    Baseline  5 minutes of sitting    Time  6    Period  Weeks    Status  New      PT LONG TERM GOAL #5   Title  Patient will be able to perform overhead shoulder flexion to AROM to reach the unaffected side around (155 degrees of flexion) to reach the higher shelf.    Baseline  125 degrees of flexion AROM    Time  6    Period  Weeks    Status  New      Additional Long Term Goals   Additional Long Term Goals  Yes      PT LONG TERM GOAL #6   Title  Patient will have a worst pain of 2/10 with overhead shoulder flexion to better be able to lift items overhead.    Baseline  5/10    Time  6    Period  Weeks    Status  New      PT LONG TERM GOAL #7   Title  Patient will be able to perform resisted  L shoulder ER to the same extent as the unaffected side to better be able to perform overhead lifting.    Baseline  4/5 on the L shoulder    Time  6    Period  Weeks    Status  New            Plan - 08/22/19 1600    Clinical Impression Statement  Patient reports continued tightness in shoulder reduced with manual therapy and not limiting exercise. Patient demonstrates poor recall with HEP; HEP reviewed and new handout provided. Tolerates core exercises well reporting no LBP at end of session but demonstrates limited carryover between sessions indicating need for additional exercise to build activity tolerance. Patient will benefit from skilled physical therapy to return to PLOF.    Personal Factors and Comorbidities  Age;Comorbidity 1    Comorbidities  hyperthyroidism    Examination-Activity Limitations  Bend;Sit    Examination-Participation Restrictions  Community Activity    Stability/Clinical Decision Making  Stable/Uncomplicated    Rehab Potential  Good    PT Frequency  2x / week    PT Duration  6 weeks    PT Treatment/Interventions  Therapeutic exercise;Therapeutic activities;Balance training;Neuromuscular re-education;Cognitive  remediation;Gait  training;Electrical Stimulation;Moist Heat;Stair training;Patient/family education;Dry needling;Passive range of motion;Joint Manipulations;Spinal Manipulations    PT Next Visit Plan  progress strengthening and AROM based exercises    PT Home Exercise Plan  See education section    Consulted and Agree with Plan of Care  Patient       Patient will benefit from skilled therapeutic intervention in order to improve the following deficits and impairments:  Abnormal gait, Increased fascial restricitons, Pain, Decreased mobility, Decreased coordination, Increased muscle spasms, Decreased activity tolerance, Decreased strength, Decreased range of motion, Decreased endurance, Difficulty walking  Visit Diagnosis: Acute bilateral low back pain without sciatica  Stiffness of right hip, not elsewhere classified  Left shoulder pain, unspecified chronicity  Stiffness of left hip, not elsewhere classified     Problem List Patient Active Problem List   Diagnosis Date Noted  . Finger infection 03/05/2019  . Skin infection 01/04/2019  . URI (upper respiratory infection) 01/04/2019  . Healthcare maintenance 12/05/2018  . Left shoulder pain 03/12/2018  . Anxiety 08/18/2017  . Hematuria 08/16/2017  . Low back pain 01/10/2017  . Encounter for counseling 11/06/2016  . Cough 10/06/2016  . Diarrhea 10/06/2016  . Fibrocystic breast disease 05/05/2016  . Osteopenia 05/05/2016  . Cyst of pancreas 08/13/2015  . 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 Acquired hypothyroidism 08/02/2014  . Rhytides 11/20/2013  . Postmenopausal 10/13/1991    Virgia Land, SPT 08/23/2019, 12:32 PM  [facility] [address] [place], [address] Phone: [phone]   Fax:  [phone]  Name: Sherry Bernard MRN: [id number] Date of Birth: [birth year]

## 2019-08-28 ENCOUNTER — Other Ambulatory Visit: Payer: Self-pay

## 2019-08-29 ENCOUNTER — Ambulatory Visit: Payer: PPO

## 2019-08-29 DIAGNOSIS — M545 Low back pain, unspecified: Secondary | ICD-10-CM

## 2019-08-29 DIAGNOSIS — M25512 Pain in left shoulder: Secondary | ICD-10-CM

## 2019-08-29 DIAGNOSIS — M25651 Stiffness of right hip, not elsewhere classified: Secondary | ICD-10-CM

## 2019-08-29 NOTE — Therapy (Signed)
[facility] [address] [place], [address] Phone: [phone]   Fax:  [phone]  Physical Therapy Treatment  Patient Details  Name: Sherry Bernard MRN: [id number] Date of Birth: [birth year] Referring Provider (PT): Nicki Reaper MD   Encounter Date: 08/29/2019  PT End of Session - 08/29/19 1437    Visit Number  23    Number of Visits  33    Date for PT Re-Evaluation  08/15/19    Authorization Type  8/10    PT Start Time  8502    PT Stop Time  1515    PT Time Calculation (min)  41 min    Activity Tolerance  Patient tolerated treatment well;No increased pain    Behavior During Therapy  WFL for tasks assessed/performed       Past Medical History:  Diagnosis Date  . Colon polyp   . Hyperthyroidism     Past Surgical History:  Procedure Laterality Date  . APPENDECTOMY     child  . COLONOSCOPY WITH PROPOFOL N/A 06/26/2019   Procedure: COLONOSCOPY WITH PROPOFOL;  Surgeon: Lollie Sails, MD;  Location: [facility] ENDOSCOPY;  Service: Endoscopy;  Laterality: N/A;  . TONSILECTOMY, ADENOIDECTOMY, BILATERAL MYRINGOTOMY AND TUBES     child    There were no vitals filed for this visit.  Subjective Assessment - 08/29/19 1435    Subjective  Patient reports LBP and presents with pain patch for back. Reports doing HEP and reports that shoulder is doing better but pain reoccurs    Pertinent History  hyperthyroidism     Limitations  Sitting;Lifting    How long can you sit comfortably?  Hurts with going from sitting to standing - decreases after 10 min of walking     Diagnostic tests  no    Patient Stated Goals  to decrease pain    Currently in Pain?  Yes    Pain Score  6     Pain Location  Back    Pain Onset  More than a month ago    Multiple Pain Sites  No       TREATMENT  TE LTRs x 20 R/L  Bridges segmental x 20 with band for abductor activation and stabilization Pelvic clock on ball x 20 PPT/APT, lateral  tilt Marches on ball x 20 with cues for core stability Prayer stretch 3 x 30 sec with bias to R/L side flexion DKTC x 30 sec for  Dead bugs x 20 with cues for correct technique Standing lumbar extension x 10 Standing R hip flexion/extension for pain reduction and improved balance over L -- x 10 Step ups over R LE -- x10  Hip hikes x 10 R/L  TE for improved core control and reduction of pain  Gait analysis: Note narrowed stance, pronation B, limited extension R>L in stance phase, mild hyperlordosis  MT Note pain over PSIS Pelvic compression hypomobile on L Prone bent-knee test demonstrates LLD R short MET correction 4 x 3 sec hold corrects LLD L thigh thrust grade 3 x 20 grade 5 x 1 for reduction of pain Soft tissue mobilization to quadriceps R x 30 sec for relief of pain   MT for reduction of pain and improved mobility through hip and pelvis    PT Education - 08/29/19 1434    Education Details  form/technique with exercise    Person(s) Educated  Patient    Methods  Explanation;Demonstration;Tactile cues;Verbal cues    Comprehension  Verbalized understanding;Returned demonstration;Verbal cues required;Tactile cues required          PT Long Term Goals - 07/25/19 1516      PT LONG TERM GOAL #1   Title  Patient will be independent with HEP to continue benefits of therapy after discharge.    Baseline  Dependent with form/technique; Requires significant cueing with exercise performance; Heavy cues required for exercise performance.    Time  6    Period  Weeks    Status  On-going      PT LONG TERM GOAL #2   Title  Patient will be able to sit for >1 hour without increase in pain to better be able to rest comfortably without increase in pain    Baseline  Increased pain after 5 to 10 min; 05/16/2019: Able to sit for 3 hours    Time  6    Period  Weeks    Status  Achieved      PT LONG TERM GOAL #3   Title  Patient will have a worst pain in the low back to under 3/10 to  demonstrate improvement with functional activities.    Baseline  7/10 worst pain; 05/16/2019: 5/10; 07/04/2019: 3/10    Time  6    Period  Weeks    Status  On-going      PT LONG TERM GOAL #4   Title  Patient will have no increase in pain when standing after sitting for >1hour to better be able to drive for longer periods of time.    Baseline  5 minutes of sitting    Time  6    Period  Weeks    Status  New      PT LONG TERM GOAL #5   Title  Patient will be able to perform overhead shoulder flexion to AROM to reach the unaffected side around (155 degrees of flexion) to reach the higher shelf.    Baseline  125 degrees of flexion AROM    Time  6    Period  Weeks    Status  New      Additional Long Term Goals   Additional Long Term Goals  Yes      PT LONG TERM GOAL #6   Title  Patient will have a worst pain of 2/10 with overhead shoulder flexion to better be able to lift items overhead.    Baseline  5/10    Time  6    Period  Weeks    Status  New      PT LONG TERM GOAL #7   Title  Patient will be able to perform resisted  L shoulder ER to the same extent as the unaffected side to better be able to perform overhead lifting.    Baseline  4/5 on the L shoulder    Time  6    Period  Weeks    Status  New              Patient will benefit from skilled therapeutic intervention in order to improve the following deficits and impairments:     Visit Diagnosis: Acute bilateral low back pain without sciatica  Stiffness of right hip, not elsewhere classified  Left shoulder pain, unspecified chronicity     Problem List Patient Active Problem List   Diagnosis Date Noted  . Finger infection 03/05/2019  . Skin infection 01/04/2019  . URI (upper respiratory infection) 01/04/2019  . Healthcare maintenance 12/05/2018  . Left  shoulder pain 03/12/2018  . Anxiety 08/18/2017  . Hematuria 08/16/2017  . Low back pain 01/10/2017  . Encounter for counseling 11/06/2016  . Cough  10/06/2016  . Diarrhea 10/06/2016  . Fibrocystic breast disease 05/05/2016  . Osteopenia 05/05/2016  . Cyst of pancreas 08/13/2015  . 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 Acquired hypothyroidism 08/02/2014  . Rhytides 11/20/2013  . Postmenopausal 10/13/1991    Virgia Land, SPT 08/29/2019, 2:40 PM  [facility] [address] [place], [address] Phone: [phone]   Fax:  [phone]  Name: Sherry Bernard MRN: [id number] Date of Birth: 1941-07-14

## 2019-08-30 ENCOUNTER — Other Ambulatory Visit: Payer: Self-pay

## 2019-08-30 ENCOUNTER — Encounter: Payer: Self-pay | Admitting: Internal Medicine

## 2019-08-30 ENCOUNTER — Other Ambulatory Visit (INDEPENDENT_AMBULATORY_CARE_PROVIDER_SITE_OTHER): Payer: PPO

## 2019-08-30 ENCOUNTER — Telehealth: Payer: Self-pay | Admitting: *Deleted

## 2019-08-30 DIAGNOSIS — Z87448 Personal history of other diseases of urinary system: Secondary | ICD-10-CM

## 2019-08-30 DIAGNOSIS — E7800 Pure hypercholesterolemia, unspecified: Secondary | ICD-10-CM

## 2019-08-30 LAB — CBC WITH DIFFERENTIAL/PLATELET
Basophils Absolute: 0.1 10*3/uL (ref 0.0–0.1)
Basophils Relative: 0.8 % (ref 0.0–3.0)
Eosinophils Absolute: 0.1 10*3/uL (ref 0.0–0.7)
Eosinophils Relative: 1.3 % (ref 0.0–5.0)
HCT: 41.2 % (ref 36.0–46.0)
Hemoglobin: 13.7 g/dL (ref 12.0–15.0)
Lymphocytes Relative: 20.6 % (ref 12.0–46.0)
Lymphs Abs: 1.5 10*3/uL (ref 0.7–4.0)
MCHC: 33.3 g/dL (ref 30.0–36.0)
MCV: 93.1 fl (ref 78.0–100.0)
Monocytes Absolute: 0.7 10*3/uL (ref 0.1–1.0)
Monocytes Relative: 9.7 % (ref 3.0–12.0)
Neutro Abs: 4.8 10*3/uL (ref 1.4–7.7)
Neutrophils Relative %: 67.6 % (ref 43.0–77.0)
Platelets: 274 10*3/uL (ref 150.0–400.0)
RBC: 4.43 Mil/uL (ref 3.87–5.11)
RDW: 13.1 % (ref 11.5–15.5)
WBC: 7.1 10*3/uL (ref 4.0–10.5)

## 2019-08-30 LAB — FERRITIN: Ferritin: 51.2 ng/mL (ref 10.0–291.0)

## 2019-08-30 NOTE — Telephone Encounter (Signed)
Is she agreeable to restart her cholesterol medication?  Given calculated cholesterol risk, she was started on crestor for risk factor reduction.

## 2019-08-30 NOTE — Telephone Encounter (Signed)
Order placed for labs.

## 2019-08-30 NOTE — Addendum Note (Signed)
Addended by: Leeanne Rio on: 08/30/2019 10:40 AM   Modules accepted: Orders

## 2019-08-30 NOTE — Addendum Note (Signed)
Addended by: Leeanne Rio on: 08/30/2019 10:24 AM   Modules accepted: Orders

## 2019-08-30 NOTE — Telephone Encounter (Signed)
Lab appt scheduled in 6 weeks./

## 2019-08-30 NOTE — Telephone Encounter (Signed)
Pt was seen at [facility] recently & asked why she was on Crestor so she just stopped it. Pt states that she stopped taking it on 08/09/19 & did not notify anyone.  But she also states that was told that her cholesterol was off during her last lab check.  If you would like to add on a lipid panel to her labs drawn today.  Thanks

## 2019-08-30 NOTE — Telephone Encounter (Signed)
Patient is agreeable to restart cholesterol medication

## 2019-09-05 ENCOUNTER — Other Ambulatory Visit: Payer: Self-pay

## 2019-09-05 ENCOUNTER — Ambulatory Visit: Payer: PPO

## 2019-09-05 DIAGNOSIS — M545 Low back pain, unspecified: Secondary | ICD-10-CM

## 2019-09-05 DIAGNOSIS — M25512 Pain in left shoulder: Secondary | ICD-10-CM

## 2019-09-05 DIAGNOSIS — M25651 Stiffness of right hip, not elsewhere classified: Secondary | ICD-10-CM

## 2019-09-05 NOTE — Therapy (Signed)
[facility] [address], [place], [address] Phone: [phone]   Fax:  [phone]  Physical Therapy Treatment  Patient Details  Name: Sherry Bernard MRN: [id number] Date of Birth: August 05, 1941 Referring Provider (PT): Nicki Reaper MD   Encounter Date: 09/05/2019    Past Medical History:  Diagnosis Date  . Colon polyp   . Hyperthyroidism     Past Surgical History:  Procedure Laterality Date  . APPENDECTOMY     child  . COLONOSCOPY WITH PROPOFOL N/A 06/26/2019   Procedure: COLONOSCOPY WITH PROPOFOL;  Surgeon: Lollie Sails, MD;  Location: [facility] ENDOSCOPY;  Service: Endoscopy;  Laterality: N/A;  . TONSILECTOMY, ADENOIDECTOMY, BILATERAL MYRINGOTOMY AND TUBES     child    There were no vitals filed for this visit.    MT: Myofascial release and trigger point release to R thoracic paraspinals R/L lumbar parasinals R QL Thigh thrust SIJ mobilization R grade 3 x 20 grade 5 x 1 Note L lumbar scoliosis   MT for pain relief and improved tissue extensibility  TE Sidelying SLR R/L x 10 Seated lumbar flexion over ball x 20 Palloff press resisted rotation to L 2 x 20 blue theraband Hooklying hip adduction isometric 4 x 4 sec hold Hooklying hip abduction isometric 4 x 4 sec hold Supine tabletop core activation isometric x 20 SLS 10 sec holds R/L note improved stability over R  TE for SIJ mobilization, anterior core stabilization, and hip girdle stabilization for reduction of LBP     PT Long Term Goals - 07/25/19 1516      PT LONG TERM GOAL #1   Title  Patient will be independent with HEP to continue benefits of therapy after discharge.    Baseline  Dependent with form/technique; Requires significant cueing with exercise performance; Heavy cues required for exercise performance.    Time  6    Period  Weeks    Status  On-going      PT LONG TERM GOAL #2   Title  Patient will be able to sit for >1 hour  without increase in pain to better be able to rest comfortably without increase in pain    Baseline  Increased pain after 5 to 10 min; 05/16/2019: Able to sit for 3 hours    Time  6    Period  Weeks    Status  Achieved      PT LONG TERM GOAL #3   Title  Patient will have a worst pain in the low back to under 3/10 to demonstrate improvement with functional activities.    Baseline  7/10 worst pain; 05/16/2019: 5/10; 07/04/2019: 3/10    Time  6    Period  Weeks    Status  On-going      PT LONG TERM GOAL #4   Title  Patient will have no increase in pain when standing after sitting for >1hour to better be able to drive for longer periods of time.    Baseline  5 minutes of sitting    Time  6    Period  Weeks    Status  New      PT LONG TERM GOAL #5   Title  Patient will be able to perform overhead shoulder flexion to AROM to reach the unaffected side around (155 degrees of flexion) to reach the higher shelf.    Baseline  125 degrees of flexion AROM    Time  6  Period  Weeks    Status  New      Additional Long Term Goals   Additional Long Term Goals  Yes      PT LONG TERM GOAL #6   Title  Patient will have a worst pain of 2/10 with overhead shoulder flexion to better be able to lift items overhead.    Baseline  5/10    Time  6    Period  Weeks    Status  New      PT LONG TERM GOAL #7   Title  Patient will be able to perform resisted  L shoulder ER to the same extent as the unaffected side to better be able to perform overhead lifting.    Baseline  4/5 on the L shoulder    Time  6    Period  Weeks    Status  New              Patient will benefit from skilled therapeutic intervention in order to improve the following deficits and impairments:     Visit Diagnosis: Acute bilateral low back pain without sciatica  Stiffness of right hip, not elsewhere classified  Left shoulder pain, unspecified chronicity     Problem List Patient Active Problem List   Diagnosis Date  Noted  . Finger infection 03/05/2019  . Skin infection 01/04/2019  . URI (upper respiratory infection) 01/04/2019  . Healthcare maintenance 12/05/2018  . Left shoulder pain 03/12/2018  . Anxiety 08/18/2017  . Hematuria 08/16/2017  . Low back pain 01/10/2017  . Encounter for counseling 11/06/2016  . Cough 10/06/2016  . Diarrhea 10/06/2016  . Fibrocystic breast disease 05/05/2016  . Osteopenia 05/05/2016  . Cyst of pancreas 08/13/2015  . 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 Acquired hypothyroidism 08/02/2014  . Rhytides 11/20/2013  . Postmenopausal 10/13/1991    Virgia Land, SPT 09/05/2019, 1:44 PM  [facility] [address], [place], [address] Phone: [phone]   Fax:  [phone]  Name: Sherry Bernard MRN: [id number] Date of Birth: 1941-09-27

## 2019-09-12 ENCOUNTER — Ambulatory Visit (INDEPENDENT_AMBULATORY_CARE_PROVIDER_SITE_OTHER): Payer: Self-pay | Admitting: Plastic Surgery

## 2019-09-12 ENCOUNTER — Ambulatory Visit: Payer: PPO | Attending: Internal Medicine

## 2019-09-12 ENCOUNTER — Other Ambulatory Visit: Payer: Self-pay

## 2019-09-12 ENCOUNTER — Encounter: Payer: Self-pay | Admitting: Plastic Surgery

## 2019-09-12 VITALS — BP 147/83 | HR 68 | Temp 97.8°F | Ht 63.0 in | Wt 122.6 lb

## 2019-09-12 DIAGNOSIS — M25651 Stiffness of right hip, not elsewhere classified: Secondary | ICD-10-CM | POA: Insufficient documentation

## 2019-09-12 DIAGNOSIS — M545 Low back pain, unspecified: Secondary | ICD-10-CM

## 2019-09-12 DIAGNOSIS — M25652 Stiffness of left hip, not elsewhere classified: Secondary | ICD-10-CM | POA: Diagnosis not present

## 2019-09-12 DIAGNOSIS — M25512 Pain in left shoulder: Secondary | ICD-10-CM | POA: Insufficient documentation

## 2019-09-12 DIAGNOSIS — Z719 Counseling, unspecified: Secondary | ICD-10-CM

## 2019-09-12 NOTE — Progress Notes (Signed)
Filler Injection Procedure Note  Procedure:  Filler administration  Pre-operative Diagnosis: Rytides and midface volume loss  Post-operative Diagnosis: Same  Surgeon: Electronically signed by: Theodoro Kos, DO   Complications:  None  Brief history: The patient desires injection with fillers in her face. I discussed with the patient this proposed procedure of filler injections, which is customized depending on the particular needs of the patient. It is performed on facial volume loss as a temporary correction. The alternatives were discussed with the patient. The risks were addressed including bleeding, scarring, infection, damage to deeper structures, asymmetry, and chronic pain, which may occur infrequently after a procedure. The individual's choice to undergo a surgical procedure is based on the comparison of risks to potential benefits. Other risks include unsatisfactory results, allergic reaction, which should go away with time, bruising and delayed healing. Fillers do not arrest the aging process or produce permanent tightening.  Operative intervention maybe necessary to maintain the results. The patient understands and wishes to proceed. An informed consent was signed and informational brochures given to her prior to the procedure.  Procedure: The area was prepped with chlorhexidine and dried with a clean gauze. Using a clean technique, a 30 gauge needle was then used to inject the filler into the midface area.  It was injected at the medial sub-region of the mid-face.  Half the syringe was utilized on each side. No complications were noted. Light pressure was held for 5 minutes. She was instructed explicitly in post-operative care.  Voluma LOT: [id number] EXP: 2020-02-26

## 2019-09-12 NOTE — Therapy (Signed)
[facility] [address], [place], [address] Phone: [phone]   Fax:  [phone]  Physical Therapy Treatment/ Progress Note  Patient Details  Name: Sherry Bernard MRN: [id number] Date of Birth: 04-29-1941 Referring Provider (PT): Nicki Reaper MD  Reporting period: 07/25/2019- 09/12/2019  Encounter Date: 09/12/2019  PT End of Session - 09/12/19 1304    Visit Number  25    Number of Visits  33    Date for PT Re-Evaluation  09/05/19    Authorization Type  10/10    PT Start Time  1304    PT Stop Time  1345    PT Time Calculation (min)  41 min    Activity Tolerance  Patient tolerated treatment well;No increased pain    Behavior During Therapy  WFL for tasks assessed/performed       Past Medical History:  Diagnosis Date  . Colon polyp   . Hyperthyroidism     Past Surgical History:  Procedure Laterality Date  . APPENDECTOMY     child  . COLONOSCOPY WITH PROPOFOL N/A 06/26/2019   Procedure: COLONOSCOPY WITH PROPOFOL;  Surgeon: Lollie Sails, MD;  Location: [facility] ENDOSCOPY;  Service: Endoscopy;  Laterality: N/A;  . TONSILECTOMY, ADENOIDECTOMY, BILATERAL MYRINGOTOMY AND TUBES     child    There were no vitals filed for this visit.  Subjective Assessment - 09/12/19 1304    Subjective  Patient reports back is better this week than last. Reports that shoulder was feeling better but is sore today after workout yesterday with weights and personal trainer.    Pertinent History  hyperthyroidism     Limitations  Sitting;Lifting    How long can you sit comfortably?  Hurts with going from sitting to standing - decreases after 10 min of walking     Diagnostic tests  no    Patient Stated Goals  to decrease pain    Currently in Pain?  Yes    Pain Score  5     Pain Location  Back    Pain Orientation  Lower    Pain Descriptors / Indicators  Aching    Pain Onset  More than a month ago    Multiple Pain Sites  No        TREATMENT    MT Note pelvic obliquity with R ASIS superior to L MET to correct pelvic obliquity with isometric R hip flexion/L extension Posterior innominate correction mobilization per [facility] with lumbar extension x 10 R hip distraction long-axis x 20   MT for correction of innominate positional fault, SIJ mobilization, and reduced LBP  TE Assessed recall of HEP including horizontal BUE abduction, external rotation providing minor cues for correction Standing hip extension x 10 R/L Prone donkey kicks x 10 with cues for improved target muscle activation Bird dogs LE > UE > LE/UE x 20 each  GT Heel-toe gait training with mirror feedback x 10 min                   PT Education - 09/12/19 1306    Education Details  form/technique with exercise    Person(s) Educated  Patient    Methods  Explanation;Demonstration;Tactile cues;Verbal cues    Comprehension  Verbalized understanding;Returned demonstration;Verbal cues required;Tactile cues required          PT Long Term Goals - 09/12/19 1311      PT LONG TERM GOAL #1   Title  Patient will  be independent with HEP to continue benefits of therapy after discharge.    Baseline  Dependent with form/technique; Requires significant cueing with exercise performance; Heavy cues required for exercise performance.    Time  6    Period  Weeks    Status  Achieved      PT LONG TERM GOAL #2   Title  Patient will be able to sit for >1 hour without increase in pain to better be able to rest comfortably without increase in pain    Baseline  Increased pain after 5 to 10 min; 05/16/2019: Able to sit for 3 hours    Time  6    Period  Weeks    Status  Achieved      PT LONG TERM GOAL #3   Title  Patient will have a worst pain in the low back to under 3/10 to demonstrate improvement with functional activities.    Baseline  7/10 worst pain; 05/16/2019: 5/10; 07/04/2019: 3/10    Time  6    Period  Weeks    Status  On-going       PT LONG TERM GOAL #4   Title  Patient will have no increase in pain when standing after sitting for >1hour to better be able to drive for longer periods of time.    Baseline  5 minutes of sitting; 09/12/19 1 hr    Time  6    Period  Weeks    Status  Achieved      PT LONG TERM GOAL #5   Title  Patient will be able to perform overhead shoulder flexion to AROM to reach the unaffected side around (155 degrees of flexion) to reach the higher shelf.    Baseline  125 degrees of flexion AROM; 12/1 WFL    Time  6    Period  Weeks    Status  Achieved      PT LONG TERM GOAL #6   Title  Patient will have a worst pain of 2/10 with overhead shoulder flexion to better be able to lift items overhead.    Baseline  5/10; 12/1 reports 4/10    Time  6    Period  Weeks    Status  New      PT LONG TERM GOAL #7   Title  Patient will be able to perform resisted  L shoulder ER to the same extent as the unaffected side to better be able to perform overhead lifting.    Baseline  4/5 on the L shoulder; 4/5 L shoulder    Time  6    Period  Weeks    Status  New            Plan - 09/12/19 1350    Clinical Impression Statement  LBP reduced following SIJ mobilizations to reduce pelvic obliquity/posterior innominate. Pt also reports she feels less pain when she forces herself to walk through whole R foot rather than lateral edge, possibly influenced by ankle pain. However, gait training with focus on heel-toe gait increased LBP after several repetitions suggesting LBP is related to innominate restrictions with walking. Note also recurring arm pain but pt feels this may be related to gym workout yesterday. Progress towards goals is evidenced by pt's improved retention of UE exercises, UE ROM, and improved sitting tolerance. Patient will benefit from skilled physical therapy to reduce LBP and restore PLOF.    Personal Factors and Comorbidities  Age;Comorbidity 1    Comorbidities  hyperthyroidism     Examination-Activity Limitations  Bend;Sit    Examination-Participation Restrictions  Community Activity    Stability/Clinical Decision Making  Stable/Uncomplicated    Rehab Potential  Good    PT Frequency  2x / week    PT Duration  6 weeks    PT Treatment/Interventions  Therapeutic exercise;Therapeutic activities;Balance training;Neuromuscular re-education;Cognitive remediation;Gait training;Electrical Stimulation;Moist Heat;Stair training;Patient/family education;Dry needling;Passive range of motion;Joint Manipulations;Spinal Manipulations    PT Next Visit Plan  UE reassessment    PT Home Exercise Plan  See education section    Consulted and Agree with Plan of Care  Patient       Patient will benefit from skilled therapeutic intervention in order to improve the following deficits and impairments:  Abnormal gait, Increased fascial restricitons, Pain, Decreased mobility, Decreased coordination, Increased muscle spasms, Decreased activity tolerance, Decreased strength, Decreased range of motion, Decreased endurance, Difficulty walking  Visit Diagnosis: Acute bilateral low back pain without sciatica  Stiffness of right hip, not elsewhere classified  Left shoulder pain, unspecified chronicity  Stiffness of left hip, not elsewhere classified     Problem List Patient Active Problem List   Diagnosis Date Noted  . Finger infection 03/05/2019  . Skin infection 01/04/2019  . URI (upper respiratory infection) 01/04/2019  . Healthcare maintenance 12/05/2018  . Left shoulder pain 03/12/2018  . Anxiety 08/18/2017  . Hematuria 08/16/2017  . Low back pain 01/10/2017  . Encounter for counseling 11/06/2016  . Cough 10/06/2016  . Diarrhea 10/06/2016  . Fibrocystic breast disease 05/05/2016  . Osteopenia 05/05/2016  . Cyst of pancreas 08/13/2015  . 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 Acquired hypothyroidism 08/02/2014  . Rhytides 11/20/2013  . Postmenopausal 10/13/1991    Virgia Land, SPT 09/12/2019, 2:05 PM  [facility] [address] [place], [address] Phone: [phone]   Fax:  [phone]  Name: Sherry Bernard MRN: [id number] Date of Birth: 1940-10-22

## 2019-09-19 ENCOUNTER — Other Ambulatory Visit: Payer: Self-pay

## 2019-09-19 ENCOUNTER — Ambulatory Visit: Payer: PPO

## 2019-09-19 DIAGNOSIS — M545 Low back pain, unspecified: Secondary | ICD-10-CM

## 2019-09-19 DIAGNOSIS — M25652 Stiffness of left hip, not elsewhere classified: Secondary | ICD-10-CM

## 2019-09-19 DIAGNOSIS — M25512 Pain in left shoulder: Secondary | ICD-10-CM

## 2019-09-19 DIAGNOSIS — M25651 Stiffness of right hip, not elsewhere classified: Secondary | ICD-10-CM

## 2019-09-19 NOTE — Therapy (Signed)
[facility] [address], [place], [address] Phone: [phone]   Fax:  [phone]  Physical Therapy Treatment  Patient Details  Name: Sherry Bernard MRN: [id number] Date of Birth: Nov 17, 1940 Referring Provider (PT): Nicki Reaper MD   Encounter Date: 09/19/2019  PT End of Session - 09/19/19 1303    Visit Number  26    Number of Visits  33    Date for PT Re-Evaluation  09/05/19    Authorization Type  1/10    PT Start Time  4174    PT Stop Time  1345    PT Time Calculation (min)  42 min    Activity Tolerance  Patient tolerated treatment well;No increased pain    Behavior During Therapy  WFL for tasks assessed/performed       Past Medical History:  Diagnosis Date  . Colon polyp   . Hyperthyroidism     Past Surgical History:  Procedure Laterality Date  . APPENDECTOMY     child  . COLONOSCOPY WITH PROPOFOL N/A 06/26/2019   Procedure: COLONOSCOPY WITH PROPOFOL;  Surgeon: Lollie Sails, MD;  Location: [facility] ENDOSCOPY;  Service: Endoscopy;  Laterality: N/A;  . TONSILECTOMY, ADENOIDECTOMY, BILATERAL MYRINGOTOMY AND TUBES     child    There were no vitals filed for this visit.  Subjective Assessment - 09/19/19 1303    Subjective  Patient reports that low back pain is worse but shoulder is fine.    Pertinent History  hyperthyroidism     Limitations  Sitting;Lifting    How long can you sit comfortably?  Hurts with going from sitting to standing - decreases after 10 min of walking     Diagnostic tests  no    Patient Stated Goals  to decrease pain    Currently in Pain?  Yes    Pain Score  9     Pain Location  Back    Pain Orientation  Lower    Pain Descriptors / Indicators  Aching    Pain Type  Chronic pain    Pain Onset  More than a month ago    Pain Frequency  Intermittent    Multiple Pain Sites  No         TREATMENT  Note L ASIS high/prominent relative to R  MT MET correction for pelvic  obliquity - note even ASIS after treatment Soft tissue mobilization and ischemic compression over R quads R glutes, paraspinals with focus over R vastus lateralis R paraspinals Prone posterior mobilization of L ilium on sacrum - no change in pain Prone posterior mobilization of R ilium on sacrum with concurrent spinal/RLE extension x 10 each - reduced pain in SI/lumbar region  TE Bridges x 20 Standing hip extension x 20 Lats 35# x 20 Running man over R x 20 Ball bridges x 20     PT Education - 09/19/19 1302    Education Details  form/technique with exercise    Person(s) Educated  Patient    Methods  Explanation;Demonstration;Tactile cues;Verbal cues    Comprehension  Verbalized understanding;Returned demonstration;Verbal cues required;Tactile cues required          PT Long Term Goals - 09/12/19 1311      PT LONG TERM GOAL #1   Title  Patient will be independent with HEP to continue benefits of therapy after discharge.    Baseline  Dependent with form/technique; Requires significant cueing with exercise performance; Heavy cues required for exercise  performance.    Time  6    Period  Weeks    Status  Achieved      PT LONG TERM GOAL #2   Title  Patient will be able to sit for >1 hour without increase in pain to better be able to rest comfortably without increase in pain    Baseline  Increased pain after 5 to 10 min; 05/16/2019: Able to sit for 3 hours    Time  6    Period  Weeks    Status  Achieved      PT LONG TERM GOAL #3   Title  Patient will have a worst pain in the low back to under 3/10 to demonstrate improvement with functional activities.    Baseline  7/10 worst pain; 05/16/2019: 5/10; 07/04/2019: 3/10    Time  6    Period  Weeks    Status  On-going      PT LONG TERM GOAL #4   Title  Patient will have no increase in pain when standing after sitting for >1hour to better be able to drive for longer periods of time.    Baseline  5 minutes of sitting; 09/12/19 1 hr     Time  6    Period  Weeks    Status  Achieved      PT LONG TERM GOAL #5   Title  Patient will be able to perform overhead shoulder flexion to AROM to reach the unaffected side around (155 degrees of flexion) to reach the higher shelf.    Baseline  125 degrees of flexion AROM; 12/1 WFL    Time  6    Period  Weeks    Status  Achieved      PT LONG TERM GOAL #6   Title  Patient will have a worst pain of 2/10 with overhead shoulder flexion to better be able to lift items overhead.    Baseline  5/10; 12/1 reports 4/10    Time  6    Period  Weeks    Status  New      PT LONG TERM GOAL #7   Title  Patient will be able to perform resisted  L shoulder ER to the same extent as the unaffected side to better be able to perform overhead lifting.    Baseline  4/5 on the L shoulder; 4/5 L shoulder    Time  6    Period  Weeks    Status  New            Plan - 09/19/19 1358    Clinical Impression Statement  LBP reduced with manual therapy and SIJ mobilizations. Extension exercises in symmetrical patterns relieve pain but no additional relief or improved function is immediately apparent with progression to asymmetric patterns needed for pelvic stabilization while walking. Note also forward flexed posture which is thought to be contributory. Plan to strengthen in extension for pain relief. Patient will benefit from skilled physical therapy to reduce LBP and restore PLOF.    Personal Factors and Comorbidities  Age;Comorbidity 1    Comorbidities  hyperthyroidism    Examination-Activity Limitations  Bend;Sit    Examination-Participation Restrictions  Community Activity    Stability/Clinical Decision Making  Stable/Uncomplicated    Rehab Potential  Good    PT Frequency  2x / week    PT Duration  6 weeks    PT Treatment/Interventions  Therapeutic exercise;Therapeutic activities;Balance training;Neuromuscular re-education;Cognitive remediation;Gait training;Electrical Stimulation;Moist Heat;Stair  training;Patient/family education;Dry  needling;Passive range of motion;Joint Manipulations;Spinal Manipulations    PT Next Visit Plan  UE reassessment    PT Home Exercise Plan  See education section    Consulted and Agree with Plan of Care  Patient       Patient will benefit from skilled therapeutic intervention in order to improve the following deficits and impairments:  Abnormal gait, Increased fascial restricitons, Pain, Decreased mobility, Decreased coordination, Increased muscle spasms, Decreased activity tolerance, Decreased strength, Decreased range of motion, Decreased endurance, Difficulty walking  Visit Diagnosis: Acute bilateral low back pain without sciatica  Stiffness of right hip, not elsewhere classified  Left shoulder pain, unspecified chronicity  Stiffness of left hip, not elsewhere classified     Problem List Patient Active Problem List   Diagnosis Date Noted  . Finger infection 03/05/2019  . Skin infection 01/04/2019  . URI (upper respiratory infection) 01/04/2019  . Healthcare maintenance 12/05/2018  . Left shoulder pain 03/12/2018  . Anxiety 08/18/2017  . Hematuria 08/16/2017  . Low back pain 01/10/2017  . Encounter for counseling 11/06/2016  . Cough 10/06/2016  . Diarrhea 10/06/2016  . Fibrocystic breast disease 05/05/2016  . Osteopenia 05/05/2016  . Cyst of pancreas 08/13/2015  . 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 Acquired hypothyroidism 08/02/2014  . Rhytides 11/20/2013  . Postmenopausal 10/13/1991    Virgia Land, SPT 09/19/2019, 2:07 PM  [facility] [address] [place], [address] Phone: [phone]   Fax:  [phone]  Name: Sherry Bernard MRN: [id number] Date of Birth: 07-Jun-1941

## 2019-09-26 ENCOUNTER — Ambulatory Visit: Payer: PPO

## 2019-09-26 ENCOUNTER — Other Ambulatory Visit: Payer: Self-pay

## 2019-09-26 DIAGNOSIS — M545 Low back pain, unspecified: Secondary | ICD-10-CM

## 2019-09-26 DIAGNOSIS — M25512 Pain in left shoulder: Secondary | ICD-10-CM

## 2019-09-26 DIAGNOSIS — M25651 Stiffness of right hip, not elsewhere classified: Secondary | ICD-10-CM

## 2019-09-26 NOTE — Therapy (Signed)
[facility] [address] [place], [address] Phone: [phone]   Fax:  [phone]  Physical Therapy Treatment  Patient Details  Name: Sherry Bernard MRN: [id number] Date of Birth: 09-30-1941 Referring Provider (PT): Nicki Reaper MD   Encounter Date: 09/26/2019  PT End of Session - 09/26/19 1306    Visit Number  27    Number of Visits  33    Date for PT Re-Evaluation  10/24/19    Authorization Type  2/10    PT Start Time  9563    PT Stop Time  1345    PT Time Calculation (min)  42 min    Activity Tolerance  Patient tolerated treatment well;No increased pain    Behavior During Therapy  WFL for tasks assessed/performed       Past Medical History:  Diagnosis Date  . Colon polyp   . Hyperthyroidism     Past Surgical History:  Procedure Laterality Date  . APPENDECTOMY     child  . COLONOSCOPY WITH PROPOFOL N/A 06/26/2019   Procedure: COLONOSCOPY WITH PROPOFOL;  Surgeon: Lollie Sails, MD;  Location: [facility] ENDOSCOPY;  Service: Endoscopy;  Laterality: N/A;  . TONSILECTOMY, ADENOIDECTOMY, BILATERAL MYRINGOTOMY AND TUBES     child    There were no vitals filed for this visit.  Subjective Assessment - 09/26/19 1305    Subjective  Patient reports that LBP has returned to baseline    Pertinent History  hyperthyroidism     Limitations  Sitting;Lifting    How long can you sit comfortably?  Hurts with going from sitting to standing - decreases after 10 min of walking     Diagnostic tests  no    Patient Stated Goals  to decrease pain    Currently in Pain?  Yes    Pain Score  5     Pain Location  Back    Pain Orientation  Lower    Pain Descriptors / Indicators  Aching    Pain Type  Chronic pain    Pain Onset  More than a month ago    Multiple Pain Sites  No      TREATMENT  MT Pelvic compression test + L Thigh thrust mobilization grade 3 x 20 grade 5 x 1 for L SIJ mobilization  TE MET for  correction of pelvic obliquity with RLE isometric extension LLE flexion 2 x 5 sec MVIC hold "Pelvic shotgun" MET isometric hip adduction/abduction 2 x 5 sec MVIC hold Bridges x 20 Bridges with LLE raised to promote RLE extension moment x 20 Medicine ball twists to L 4.5# x 10 10# side bending to L x 10 Running man over R 2 x 10 - cues for improved stability through R hip. Patient able to correct Lateral stepping onto 4" step R leading x 10 Glute med hip hikes onto 4" step x 10 - note R truncal lean compensation pt able to correct inconsistently with cues.      PT Education - 09/26/19 1306    Education Details  form/technique with exercise    Person(s) Educated  Patient    Methods  Explanation;Demonstration;Tactile cues;Verbal cues    Comprehension  Verbalized understanding;Returned demonstration;Verbal cues required;Tactile cues required          PT Long Term Goals - 09/12/19 1311      PT LONG TERM GOAL #1   Title  Patient will be independent with HEP to continue benefits of therapy  after discharge.    Baseline  Dependent with form/technique; Requires significant cueing with exercise performance; Heavy cues required for exercise performance.    Time  6    Period  Weeks    Status  Achieved      PT LONG TERM GOAL #2   Title  Patient will be able to sit for >1 hour without increase in pain to better be able to rest comfortably without increase in pain    Baseline  Increased pain after 5 to 10 min; 05/16/2019: Able to sit for 3 hours    Time  6    Period  Weeks    Status  Achieved      PT LONG TERM GOAL #3   Title  Patient will have a worst pain in the low back to under 3/10 to demonstrate improvement with functional activities.    Baseline  7/10 worst pain; 05/16/2019: 5/10; 07/04/2019: 3/10    Time  6    Period  Weeks    Status  On-going      PT LONG TERM GOAL #4   Title  Patient will have no increase in pain when standing after sitting for >1hour to better be able to drive  for longer periods of time.    Baseline  5 minutes of sitting; 09/12/19 1 hr    Time  6    Period  Weeks    Status  Achieved      PT LONG TERM GOAL #5   Title  Patient will be able to perform overhead shoulder flexion to AROM to reach the unaffected side around (155 degrees of flexion) to reach the higher shelf.    Baseline  125 degrees of flexion AROM; 12/1 WFL    Time  6    Period  Weeks    Status  Achieved      PT LONG TERM GOAL #6   Title  Patient will have a worst pain of 2/10 with overhead shoulder flexion to better be able to lift items overhead.    Baseline  5/10; 12/1 reports 4/10    Time  6    Period  Weeks    Status  New      PT LONG TERM GOAL #7   Title  Patient will be able to perform resisted  L shoulder ER to the same extent as the unaffected side to better be able to perform overhead lifting.    Baseline  4/5 on the L shoulder; 4/5 L shoulder    Time  6    Period  Weeks    Status  New            Plan - 09/26/19 1450    Clinical Impression Statement  LBP reduced with manual therapy and exercises. Pt reports pain in low back is eliminated following asymmetric exercises but c/o lateral hip pain consistent with muscle soreness following exercise. Plan to progress asymmetric exercise patterns for improved RLE hip stability and pelvic girdle stability. Patient will benefit from skilled physical therapy to reduce LBP and restore PLOF.    Personal Factors and Comorbidities  Age;Comorbidity 1    Comorbidities  hyperthyroidism    Examination-Activity Limitations  Bend;Sit    Examination-Participation Restrictions  Community Activity    Stability/Clinical Decision Making  Stable/Uncomplicated    Rehab Potential  Good    PT Frequency  2x / week    PT Duration  6 weeks    PT Treatment/Interventions  Therapeutic exercise;Therapeutic  activities;Balance training;Neuromuscular re-education;Cognitive remediation;Gait training;Electrical Stimulation;Moist Heat;Stair  training;Patient/family education;Dry needling;Passive range of motion;Joint Manipulations;Spinal Manipulations    PT Next Visit Plan  UE reassessment    PT Home Exercise Plan  See education section    Consulted and Agree with Plan of Care  Patient       Patient will benefit from skilled therapeutic intervention in order to improve the following deficits and impairments:  Abnormal gait, Increased fascial restricitons, Pain, Decreased mobility, Decreased coordination, Increased muscle spasms, Decreased activity tolerance, Decreased strength, Decreased range of motion, Decreased endurance, Difficulty walking  Visit Diagnosis: Acute bilateral low back pain without sciatica  Stiffness of right hip, not elsewhere classified  Left shoulder pain, unspecified chronicity     Problem List Patient Active Problem List   Diagnosis Date Noted  . Finger infection 03/05/2019  . Skin infection 01/04/2019  . URI (upper respiratory infection) 01/04/2019  . Healthcare maintenance 12/05/2018  . Left shoulder pain 03/12/2018  . Anxiety 08/18/2017  . Hematuria 08/16/2017  . Low back pain 01/10/2017  . Encounter for counseling 11/06/2016  . Cough 10/06/2016  . Diarrhea 10/06/2016  . Fibrocystic breast disease 05/05/2016  . Osteopenia 05/05/2016  . Cyst of pancreas 08/13/2015  . 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 Acquired hypothyroidism 08/02/2014  . Rhytides 11/20/2013  . Postmenopausal 10/13/1991    Virgia Land, SPT 09/26/2019, 3:04 PM  [facility] [address] [place], [address] Phone: [phone]   Fax:  [phone]  Name: Sherry Bernard MRN: [id number] Date of Birth: [birth year]

## 2019-09-29 ENCOUNTER — Encounter: Payer: PPO | Admitting: Plastic Surgery

## 2019-10-02 DIAGNOSIS — H40033 Anatomical narrow angle, bilateral: Secondary | ICD-10-CM | POA: Diagnosis not present

## 2019-10-02 DIAGNOSIS — H1045 Other chronic allergic conjunctivitis: Secondary | ICD-10-CM | POA: Diagnosis not present

## 2019-10-02 DIAGNOSIS — H11441 Conjunctival cysts, right eye: Secondary | ICD-10-CM | POA: Diagnosis not present

## 2019-10-02 DIAGNOSIS — H43811 Vitreous degeneration, right eye: Secondary | ICD-10-CM | POA: Diagnosis not present

## 2019-10-02 DIAGNOSIS — H40013 Open angle with borderline findings, low risk, bilateral: Secondary | ICD-10-CM | POA: Diagnosis not present

## 2019-10-02 DIAGNOSIS — H2513 Age-related nuclear cataract, bilateral: Secondary | ICD-10-CM | POA: Diagnosis not present

## 2019-10-02 DIAGNOSIS — H04123 Dry eye syndrome of bilateral lacrimal glands: Secondary | ICD-10-CM | POA: Diagnosis not present

## 2019-10-03 ENCOUNTER — Other Ambulatory Visit: Payer: Self-pay

## 2019-10-03 ENCOUNTER — Ambulatory Visit: Payer: PPO

## 2019-10-03 DIAGNOSIS — M545 Low back pain, unspecified: Secondary | ICD-10-CM

## 2019-10-03 DIAGNOSIS — M25651 Stiffness of right hip, not elsewhere classified: Secondary | ICD-10-CM

## 2019-10-03 DIAGNOSIS — M25512 Pain in left shoulder: Secondary | ICD-10-CM

## 2019-10-03 NOTE — Therapy (Signed)
[facility] [address], [place], [address] Phone: [phone]   Fax:  [phone]  Physical Therapy Treatment  Patient Details  Name: Sherry Bernard MRN: [id number] Date of Birth: 04-05-1941 Referring Provider (PT): Nicki Reaper MD   Encounter Date: 10/03/2019  PT End of Session - 10/03/19 1304    Visit Number  28    Number of Visits  33    Date for PT Re-Evaluation  10/24/19    Authorization Type  3/10    PT Start Time  1302    PT Stop Time  1345    PT Time Calculation (min)  43 min    Activity Tolerance  Patient tolerated treatment well;No increased pain    Behavior During Therapy  WFL for tasks assessed/performed       Past Medical History:  Diagnosis Date  . Colon polyp   . Hyperthyroidism     Past Surgical History:  Procedure Laterality Date  . APPENDECTOMY     child  . COLONOSCOPY WITH PROPOFOL N/A 06/26/2019   Procedure: COLONOSCOPY WITH PROPOFOL;  Surgeon: Lollie Sails, MD;  Location: [facility] ENDOSCOPY;  Service: Endoscopy;  Laterality: N/A;  . TONSILECTOMY, ADENOIDECTOMY, BILATERAL MYRINGOTOMY AND TUBES     child    There were no vitals filed for this visit.  Subjective Assessment - 10/03/19 1302    Subjective  Patient reports that LBP "is a lot better" following last session. Still feels LBP with prolonged walking or transfers from sitting.    Pertinent History  hyperthyroidism     Limitations  Sitting;Lifting    How long can you sit comfortably?  Hurts with going from sitting to standing - decreases after 10 min of walking     Diagnostic tests  no    Patient Stated Goals  to decrease pain    Currently in Pain?  Yes    Pain Score  5     Pain Location  Back    Pain Orientation  Lower    Pain Descriptors / Indicators  Aching    Pain Onset  More than a month ago    Multiple Pain Sites  No       TREATMENT MT Thigh thrust mobilization grade 3 x 20 grade 5 x 1 for L SIJ  mobilization  TE Bridges x 20  Bridges with LLE raised to promote RLE extension moment x 20 Medicine ball lift to L 4.5# x 10 Bird dog progression UE > LE > UE & LE x 10 each Glute med hip hikes R x 10 - decreased step from 4" to 2" for reduced challenge. Added to HEP. Running man over R 2 x 10 - cues for improved stability  Sidebending L 5# x 20 for R core strengthening - added to HEP    PT Long Term Goals - 09/12/19 1311      PT LONG TERM GOAL #1   Title  Patient will be independent with HEP to continue benefits of therapy after discharge.    Baseline  Dependent with form/technique; Requires significant cueing with exercise performance; Heavy cues required for exercise performance.    Time  6    Period  Weeks    Status  Achieved      PT LONG TERM GOAL #2   Title  Patient will be able to sit for >1 hour without increase in pain to better be able to rest comfortably without increase in pain  Baseline  Increased pain after 5 to 10 min; 05/16/2019: Able to sit for 3 hours    Time  6    Period  Weeks    Status  Achieved      PT LONG TERM GOAL #3   Title  Patient will have a worst pain in the low back to under 3/10 to demonstrate improvement with functional activities.    Baseline  7/10 worst pain; 05/16/2019: 5/10; 07/04/2019: 3/10    Time  6    Period  Weeks    Status  On-going      PT LONG TERM GOAL #4   Title  Patient will have no increase in pain when standing after sitting for >1hour to better be able to drive for longer periods of time.    Baseline  5 minutes of sitting; 09/12/19 1 hr    Time  6    Period  Weeks    Status  Achieved      PT LONG TERM GOAL #5   Title  Patient will be able to perform overhead shoulder flexion to AROM to reach the unaffected side around (155 degrees of flexion) to reach the higher shelf.    Baseline  125 degrees of flexion AROM; 12/1 WFL    Time  6    Period  Weeks    Status  Achieved      PT LONG TERM GOAL #6   Title  Patient will have  a worst pain of 2/10 with overhead shoulder flexion to better be able to lift items overhead.    Baseline  5/10; 12/1 reports 4/10    Time  6    Period  Weeks    Status  New      PT LONG TERM GOAL #7   Title  Patient will be able to perform resisted  L shoulder ER to the same extent as the unaffected side to better be able to perform overhead lifting.    Baseline  4/5 on the L shoulder; 4/5 L shoulder    Time  6    Period  Weeks    Status  New            Plan - 10/03/19 1417    Clinical Impression Statement  LBP reduced with exercises and pt report of improvement since last session representing good carryover which has been elusive. Focused session today on HEP for core with asymmetric exercises for balancing muscular tone and stability. Pt expressed good comprehension but did require extensive cueing especially for tempo. Provided handout with tracker to measure adherence. Patient will benefit from skilled therapy to reduce LBP and restore PLOF.    Personal Factors and Comorbidities  Age;Comorbidity 1    Comorbidities  hyperthyroidism    Examination-Activity Limitations  Bend;Sit    Examination-Participation Restrictions  Community Activity    Stability/Clinical Decision Making  Stable/Uncomplicated    Rehab Potential  Good    PT Frequency  2x / week    PT Duration  6 weeks    PT Treatment/Interventions  Therapeutic exercise;Therapeutic activities;Balance training;Neuromuscular re-education;Cognitive remediation;Gait training;Electrical Stimulation;Moist Heat;Stair training;Patient/family education;Dry needling;Passive range of motion;Joint Manipulations;Spinal Manipulations    PT Next Visit Plan  Progress asymmetric core therex; UE reassessment    PT Home Exercise Plan  See education section    Consulted and Agree with Plan of Care  Patient       Patient will benefit from skilled therapeutic intervention in order to improve the following deficits  and impairments:  Abnormal  gait, Increased fascial restricitons, Pain, Decreased mobility, Decreased coordination, Increased muscle spasms, Decreased activity tolerance, Decreased strength, Decreased range of motion, Decreased endurance, Difficulty walking  Visit Diagnosis: Acute bilateral low back pain without sciatica  Stiffness of right hip, not elsewhere classified  Left shoulder pain, unspecified chronicity     Problem List Patient Active Problem List   Diagnosis Date Noted  . Finger infection 03/05/2019  . Skin infection 01/04/2019  . URI (upper respiratory infection) 01/04/2019  . Healthcare maintenance 12/05/2018  . Left shoulder pain 03/12/2018  . Anxiety 08/18/2017  . Hematuria 08/16/2017  . Low back pain 01/10/2017  . Encounter for counseling 11/06/2016  . Cough 10/06/2016  . Diarrhea 10/06/2016  . Fibrocystic breast disease 05/05/2016  . Osteopenia 05/05/2016  . Cyst of pancreas 08/13/2015  . 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 Acquired hypothyroidism 08/02/2014  . Rhytides 11/20/2013  . Postmenopausal 10/13/1991    Virgia Land, SPT 10/03/2019, 2:26 PM  [facility] [address] [place], [address] Phone: [phone]   Fax:  [phone]  Name: JAISLEY SYNNOTT MRN: [id number] Date of Birth: [birth year]

## 2019-10-04 ENCOUNTER — Telehealth: Payer: Self-pay

## 2019-10-04 NOTE — Telephone Encounter (Signed)
Call back from pt: She is calling for advice re: having Botox injection - forehead/& around her eyes by Dr. Marla Roe on 11/03/19- & she has surgery scheduled for 11/06/19 for "dry eye" procedure- she is not sure what the exact procedure will be- but the surgeon's office instructed her to check with Dr. Marla Roe for advice on waiting for Botox at a later date d/t the surgery I informed her that I will consult with Dr. Marla Roe when she returns to the office & follow her recommendation  I will call her back to advise Pt agrees with plan of care

## 2019-10-04 NOTE — Telephone Encounter (Signed)
Patient called with concerns regarding her botox appointment on 11/02/2018. She is having surgery for dry eyes on 11/05/2018 and would like to know if she should change her botox to an earlier or later date.

## 2019-10-04 NOTE — Telephone Encounter (Signed)
Call to pt- no answer- left v/m requesting c/b

## 2019-10-09 ENCOUNTER — Telehealth: Payer: Self-pay | Admitting: Internal Medicine

## 2019-10-09 NOTE — Telephone Encounter (Signed)
Pt called and had a question about her cholesterol

## 2019-10-09 NOTE — Telephone Encounter (Signed)
Call back to pt to inform her that Dr. Marla Roe recommended that she wait 3-4 weeks after the "dry eye" surgical procedure before getting Botox injection She understands the plan of care & will call back to reschedule her appointment in late Feb. Shirlean Berman

## 2019-10-10 ENCOUNTER — Ambulatory Visit: Payer: PPO

## 2019-10-10 ENCOUNTER — Other Ambulatory Visit: Payer: Self-pay

## 2019-10-10 DIAGNOSIS — M25652 Stiffness of left hip, not elsewhere classified: Secondary | ICD-10-CM

## 2019-10-10 DIAGNOSIS — M545 Low back pain, unspecified: Secondary | ICD-10-CM

## 2019-10-10 DIAGNOSIS — M25651 Stiffness of right hip, not elsewhere classified: Secondary | ICD-10-CM

## 2019-10-10 DIAGNOSIS — M25512 Pain in left shoulder: Secondary | ICD-10-CM

## 2019-10-10 NOTE — Therapy (Signed)
[facility] [address] [place], [address] Phone: [phone]   Fax:  [phone]  Physical Therapy Treatment  Patient Details  Name: Sherry Bernard MRN: [id number] Date of Birth: [birth year] Referring Provider (PT): Nicki Reaper MD   Encounter Date: 10/10/2019  PT End of Session - 10/10/19 1746    Visit Number  29    Number of Visits  33    Date for PT Re-Evaluation  10/24/19    Authorization Type  4/10    PT Start Time  [id number]    PT Stop Time  1630    PT Time Calculation (min)  42 min    Activity Tolerance  Patient tolerated treatment well;No increased pain    Behavior During Therapy  WFL for tasks assessed/performed       Past Medical History:  Diagnosis Date  . Colon polyp   . Hyperthyroidism     Past Surgical History:  Procedure Laterality Date  . APPENDECTOMY     child  . COLONOSCOPY WITH PROPOFOL N/A 06/26/2019   Procedure: COLONOSCOPY WITH PROPOFOL;  Surgeon: Lollie Sails, MD;  Location: [facility] ENDOSCOPY;  Service: Endoscopy;  Laterality: N/A;  . TONSILECTOMY, ADENOIDECTOMY, BILATERAL MYRINGOTOMY AND TUBES     child    There were no vitals filed for this visit.  Subjective Assessment - 10/10/19 1549    Subjective  Patient reports that LBP is a little better but she feels each day she may be better or the same. Walking brings on pain after 1.5 hr.    Pertinent History  hyperthyroidism     Limitations  Sitting;Lifting    How long can you sit comfortably?  Hurts with going from sitting to standing - decreases after 10 min of walking     Diagnostic tests  no    Patient Stated Goals  to decrease pain    Currently in Pain?  Yes    Pain Score  4     Pain Location  Back    Pain Orientation  Lower    Pain Descriptors / Indicators  Aching    Pain Type  Chronic pain    Pain Onset  More than a month ago           TREATMENT  TE Glute med hip hikes RLE x 10 - cues for improved target  muscle activation Bird dog progression UE > LE > LE & UE x 20 each - pt demonstrates lumbopelvic stability deficits R>L but is able to improve with cues Sidelying hip abd 2 x 10 R/L Bridges x 20 Leg press B 60# x 10, LLE 40# x 10 RLE 3 x 10 - moved RLE to center of plate for improved simulation of SLS during gait Running man onto 4" step x 10 over RLE Standing R QL stretch 3 x 30 sec  TE for RLE strengthening and hip stability to reduce load over lumbar musculature and relief of pain   MT Myofascial release, soft tissue mobilization, and manual stretch to R QL for pain relief PT Education - 10/10/19 1746    Education Details  form/technique with exercise    Person(s) Educated  Patient    Methods  Explanation;Demonstration;Tactile cues;Verbal cues    Comprehension  Verbalized understanding;Returned demonstration;Verbal cues required;Tactile cues required          PT Long Term Goals - 09/12/19 1311      PT LONG TERM GOAL #1   Title  Patient will be independent with HEP to continue benefits of therapy after discharge.    Baseline  Dependent with form/technique; Requires significant cueing with exercise performance; Heavy cues required for exercise performance.    Time  6    Period  Weeks    Status  Achieved      PT LONG TERM GOAL #2   Title  Patient will be able to sit for >1 hour without increase in pain to better be able to rest comfortably without increase in pain    Baseline  Increased pain after 5 to 10 min; 05/16/2019: Able to sit for 3 hours    Time  6    Period  Weeks    Status  Achieved      PT LONG TERM GOAL #3   Title  Patient will have a worst pain in the low back to under 3/10 to demonstrate improvement with functional activities.    Baseline  7/10 worst pain; 05/16/2019: 5/10; 07/04/2019: 3/10    Time  6    Period  Weeks    Status  On-going      PT LONG TERM GOAL #4   Title  Patient will have no increase in pain when standing after sitting for >1hour to better be  able to drive for longer periods of time.    Baseline  5 minutes of sitting; 09/12/19 1 hr    Time  6    Period  Weeks    Status  Achieved      PT LONG TERM GOAL #5   Title  Patient will be able to perform overhead shoulder flexion to AROM to reach the unaffected side around (155 degrees of flexion) to reach the higher shelf.    Baseline  125 degrees of flexion AROM; 12/1 WFL    Time  6    Period  Weeks    Status  Achieved      PT LONG TERM GOAL #6   Title  Patient will have a worst pain of 2/10 with overhead shoulder flexion to better be able to lift items overhead.    Baseline  5/10; 12/1 reports 4/10    Time  6    Period  Weeks    Status  New      PT LONG TERM GOAL #7   Title  Patient will be able to perform resisted  L shoulder ER to the same extent as the unaffected side to better be able to perform overhead lifting.    Baseline  4/5 on the L shoulder; 4/5 L shoulder    Time  6    Period  Weeks    Status  New            Plan - 10/10/19 1747    Clinical Impression Statement  LBP reduced following exercises and manual therapy. Note 0/10 pain with myofascial release to R QL which is consistent with gait deviations; plan for targeted manual therapy and exercises next session. Patient reports good adherence to HEP although patient did require corrections. Patient will benefit from skilled physical therapy to reduce LBP and restore PLOF.    Personal Factors and Comorbidities  Age;Comorbidity 1    Comorbidities  hyperthyroidism    Examination-Activity Limitations  Bend;Sit    Examination-Participation Restrictions  Community Activity    Stability/Clinical Decision Making  Stable/Uncomplicated    Rehab Potential  Good    PT Frequency  2x / week    PT Duration  6  weeks    PT Treatment/Interventions  Therapeutic exercise;Therapeutic activities;Balance training;Neuromuscular re-education;Cognitive remediation;Gait training;Electrical Stimulation;Moist Heat;Stair  training;Patient/family education;Dry needling;Passive range of motion;Joint Manipulations;Spinal Manipulations    PT Next Visit Plan  Progress asymmetric core therex; UE reassessment    PT Home Exercise Plan  QL stretch, sidebending 3#, R glute med hip hikes    Consulted and Agree with Plan of Care  Patient       Patient will benefit from skilled therapeutic intervention in order to improve the following deficits and impairments:  Abnormal gait, Increased fascial restricitons, Pain, Decreased mobility, Decreased coordination, Increased muscle spasms, Decreased activity tolerance, Decreased strength, Decreased range of motion, Decreased endurance, Difficulty walking  Visit Diagnosis: Acute bilateral low back pain without sciatica  Stiffness of right hip, not elsewhere classified  Left shoulder pain, unspecified chronicity  Stiffness of left hip, not elsewhere classified     Problem List Patient Active Problem List   Diagnosis Date Noted  . Finger infection 03/05/2019  . Skin infection 01/04/2019  . URI (upper respiratory infection) 01/04/2019  . Healthcare maintenance 12/05/2018  . Left shoulder pain 03/12/2018  . Anxiety 08/18/2017  . Hematuria 08/16/2017  . Low back pain 01/10/2017  . Encounter for counseling 11/06/2016  . Cough 10/06/2016  . Diarrhea 10/06/2016  . Fibrocystic breast disease 05/05/2016  . Osteopenia 05/05/2016  . Cyst of pancreas 08/13/2015  . 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 Acquired hypothyroidism 08/02/2014  . Rhytides 11/20/2013  . Postmenopausal 10/13/1991    Virgia Land, SPT 10/10/2019, 5:52 PM  [facility] [address] [place], [address] Phone: [phone]   Fax:  [phone]  Name: Sherry Bernard MRN: [id number] Date of Birth: March 21, 1941

## 2019-10-16 ENCOUNTER — Other Ambulatory Visit: Payer: Self-pay

## 2019-10-16 ENCOUNTER — Other Ambulatory Visit (INDEPENDENT_AMBULATORY_CARE_PROVIDER_SITE_OTHER): Payer: PPO

## 2019-10-16 DIAGNOSIS — E7800 Pure hypercholesterolemia, unspecified: Secondary | ICD-10-CM | POA: Diagnosis not present

## 2019-10-16 LAB — HEPATIC FUNCTION PANEL
ALT: 19 U/L (ref 0–35)
AST: 27 U/L (ref 0–37)
Albumin: 4 g/dL (ref 3.5–5.2)
Alkaline Phosphatase: 63 U/L (ref 39–117)
Bilirubin, Direct: 0.1 mg/dL (ref 0.0–0.3)
Total Bilirubin: 0.3 mg/dL (ref 0.2–1.2)
Total Protein: 6.7 g/dL (ref 6.0–8.3)

## 2019-10-16 NOTE — Telephone Encounter (Signed)
Patient was just calling to ensure that it was okay for her to continue taking her cholesterol medication. She was unsure why she was taking it and also what her labs are being drawn for today. Pt will continue medication

## 2019-10-17 ENCOUNTER — Encounter: Payer: Self-pay | Admitting: Internal Medicine

## 2019-10-17 ENCOUNTER — Ambulatory Visit: Payer: PPO | Attending: Internal Medicine

## 2019-10-17 DIAGNOSIS — M545 Low back pain, unspecified: Secondary | ICD-10-CM

## 2019-10-17 DIAGNOSIS — M25652 Stiffness of left hip, not elsewhere classified: Secondary | ICD-10-CM | POA: Diagnosis not present

## 2019-10-17 DIAGNOSIS — M25651 Stiffness of right hip, not elsewhere classified: Secondary | ICD-10-CM | POA: Diagnosis not present

## 2019-10-17 DIAGNOSIS — M25512 Pain in left shoulder: Secondary | ICD-10-CM | POA: Insufficient documentation

## 2019-10-17 NOTE — Therapy (Signed)
[facility] [address] [place], [address] Phone: [phone]   Fax:  [phone]  Physical Therapy Treatment  Patient Details  Name: Sherry Bernard MRN: [id number] Date of Birth: 1941-08-11 Referring Provider (PT): Nicki Reaper MD   Encounter Date: 10/17/2019  PT End of Session - 10/17/19 1616    Visit Number  30    Number of Visits  33    Date for PT Re-Evaluation  10/24/19    Authorization Type  5/10    PT Start Time  1600    PT Stop Time  1645    PT Time Calculation (min)  45 min    Activity Tolerance  Patient tolerated treatment well;No increased pain    Behavior During Therapy  WFL for tasks assessed/performed       Past Medical History:  Diagnosis Date  . Colon polyp   . Hyperthyroidism     Past Surgical History:  Procedure Laterality Date  . APPENDECTOMY     child  . COLONOSCOPY WITH PROPOFOL N/A 06/26/2019   Procedure: COLONOSCOPY WITH PROPOFOL;  Surgeon: Lollie Sails, MD;  Location: [facility] ENDOSCOPY;  Service: Endoscopy;  Laterality: N/A;  . TONSILECTOMY, ADENOIDECTOMY, BILATERAL MYRINGOTOMY AND TUBES     child    There were no vitals filed for this visit.  Subjective Assessment - 10/17/19 1605    Subjective  Patient reports she was able to get up and down and stairs without difficulty. Patient states she was able to lift a box which increased her pain    Pertinent History  hyperthyroidism     Limitations  Sitting;Lifting    How long can you sit comfortably?  Hurts with going from sitting to standing - decreases after 10 min of walking     Diagnostic tests  no    Patient Stated Goals  to decrease pain    Currently in Pain?  Yes    Pain Score  4     Pain Location  Back    Pain Orientation  Lower    Pain Descriptors / Indicators  Aching    Pain Type  Chronic pain    Pain Onset  More than a month ago    Pain Frequency  Intermittent              TREATMENT   TE Glute med  hip hikes RLE x20 - cues for improved target muscle activation Bird dog progression UE > LE > LE & UE x 20 each - pt demonstrates lumbopelvic stability deficits R>L but is able to improve with cues Sidelying hip abd x 15 R/L Bridges with arms across -- x 20 Sidelying QL stretch - x 20  Running man onto 4" step x 15 B Leg press B 65# x 10, LLE 55# x 10 RLE 3 x 10 - moved RLE to center of plate for improved simulation of SLS during gait   TE for RLE strengthening and hip stability to reduce load over lumbar musculature and relief of pain     MT Myofascial release, soft tissue mobilization, and manual stretch to R QL for pain relief with patient positioned in sidelying   PT Education - 10/17/19 1615    Education Details  form/technique with exercise    Person(s) Educated  Patient    Methods  Explanation;Demonstration    Comprehension  Verbalized understanding;Returned demonstration          PT Long Term Goals - 09/12/19  [place] #1   Title  Patient will be independent with HEP to continue benefits of therapy after discharge.    Baseline  Dependent with form/technique; Requires significant cueing with exercise performance; Heavy cues required for exercise performance.    Time  6    Period  Weeks    Status  Achieved      PT LONG TERM GOAL #2   Title  Patient will be able to sit for >1 hour without increase in pain to better be able to rest comfortably without increase in pain    Baseline  Increased pain after 5 to 10 min; 05/16/2019: Able to sit for 3 hours    Time  6    Period  Weeks    Status  Achieved      PT LONG TERM GOAL #3   Title  Patient will have a worst pain in the low back to under 3/10 to demonstrate improvement with functional activities.    Baseline  7/10 worst pain; 05/16/2019: 5/10; 07/04/2019: 3/10    Time  6    Period  Weeks    Status  On-going      PT LONG TERM GOAL #4   Title  Patient will have no increase in pain when standing after  sitting for >1hour to better be able to drive for longer periods of time.    Baseline  5 minutes of sitting; 09/12/19 1 hr    Time  6    Period  Weeks    Status  Achieved      PT LONG TERM GOAL #5   Title  Patient will be able to perform overhead shoulder flexion to AROM to reach the unaffected side around (155 degrees of flexion) to reach the higher shelf.    Baseline  125 degrees of flexion AROM; 12/1 WFL    Time  6    Period  Weeks    Status  Achieved      PT LONG TERM GOAL #6   Title  Patient will have a worst pain of 2/10 with overhead shoulder flexion to better be able to lift items overhead.    Baseline  5/10; 12/1 reports 4/10    Time  6    Period  Weeks    Status  New      PT LONG TERM GOAL #7   Title  Patient will be able to perform resisted  L shoulder ER to the same extent as the unaffected side to better be able to perform overhead lifting.    Baseline  4/5 on the L shoulder; 4/5 L shoulder    Time  6    Period  Weeks    Status  New            Plan - 10/17/19 1616    Clinical Impression Statement  Patient demonstrates improvement with exercises with ability to perform greater amount repetitions with exercises compared to previous sessions. COnitnued to address QL and lumbar extensors limitations with exercises, patient tolerates this well. Will continue to progress exercises, patient demonstrates increased difficulty with R sided exercises and will continue to address these limtiations with therapy.    Personal Factors and Comorbidities  Age;Comorbidity 1    Comorbidities  hyperthyroidism    Examination-Activity Limitations  Bend;Sit    Examination-Participation Restrictions  Community Activity    Stability/Clinical Decision Making  Stable/Uncomplicated    Rehab Potential  Good  PT Frequency  2x / week    PT Duration  6 weeks    PT Treatment/Interventions  Therapeutic exercise;Therapeutic activities;Balance training;Neuromuscular re-education;Cognitive  remediation;Gait training;Electrical Stimulation;Moist Heat;Stair training;Patient/family education;Dry needling;Passive range of motion;Joint Manipulations;Spinal Manipulations    PT Next Visit Plan  Progress asymmetric core therex; UE reassessment    PT Home Exercise Plan  QL stretch, sidebending 3#, R glute med hip hikes    Consulted and Agree with Plan of Care  Patient       Patient will benefit from skilled therapeutic intervention in order to improve the following deficits and impairments:  Abnormal gait, Increased fascial restricitons, Pain, Decreased mobility, Decreased coordination, Increased muscle spasms, Decreased activity tolerance, Decreased strength, Decreased range of motion, Decreased endurance, Difficulty walking  Visit Diagnosis: Acute bilateral low back pain without sciatica  Stiffness of right hip, not elsewhere classified     Problem List Patient Active Problem List   Diagnosis Date Noted  . Finger infection 03/05/2019  . Skin infection 01/04/2019  . URI (upper respiratory infection) 01/04/2019  . Healthcare maintenance 12/05/2018  . Left shoulder pain 03/12/2018  . Anxiety 08/18/2017  . Hematuria 08/16/2017  . Low back pain 01/10/2017  . Encounter for counseling 11/06/2016  . Cough 10/06/2016  . Diarrhea 10/06/2016  . Fibrocystic breast disease 05/05/2016  . Osteopenia 05/05/2016  . Cyst of pancreas 08/13/2015  . 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 Acquired hypothyroidism 08/02/2014  . Rhytides 11/20/2013  . Postmenopausal 10/13/1991    Blythe Stanford, PT DPT 10/17/2019, 4:41 PM  [facility] [address] [place], [address] Phone: [phone]   Fax:  [phone]  Name: Sherry Bernard MRN: [id number] Date of Birth: [birth year]

## 2019-10-30 ENCOUNTER — Ambulatory Visit: Payer: PPO | Admitting: Podiatry

## 2019-10-30 ENCOUNTER — Encounter: Payer: Self-pay | Admitting: Podiatry

## 2019-10-30 ENCOUNTER — Other Ambulatory Visit: Payer: Self-pay

## 2019-10-30 DIAGNOSIS — L603 Nail dystrophy: Secondary | ICD-10-CM

## 2019-10-30 DIAGNOSIS — B351 Tinea unguium: Secondary | ICD-10-CM | POA: Diagnosis not present

## 2019-10-30 NOTE — Progress Notes (Signed)
Subjective:  Patient ID: Sherry Bernard, female    DOB: 1941/02/21,  MRN: [id number] HPI Chief Complaint  Patient presents with  . Nail Problem    Toenails - some nails are thick and discolored and some are also sore at times  . New Patient (Initial Visit)    Est pt 2017    79 y.o. female presents with the above complaint.   ROS: Denies fever chills nausea vomiting muscle aches pains calf pain back pain chest pain shortness of breath.  Past Medical History:  Diagnosis Date  . Colon polyp   . Hyperthyroidism    Past Surgical History:  Procedure Laterality Date  . APPENDECTOMY     child  . COLONOSCOPY WITH PROPOFOL N/A 06/26/2019   Procedure: COLONOSCOPY WITH PROPOFOL;  Surgeon: Lollie Sails, MD;  Location: [facility] ENDOSCOPY;  Service: Endoscopy;  Laterality: N/A;  . TONSILECTOMY, ADENOIDECTOMY, BILATERAL MYRINGOTOMY AND TUBES     child    Current Outpatient Medications:  .  Biotin 5 MG CAPS, Take by mouth., Disp: , Rfl:  .  calcium carbonate (TUMS - DOSED IN MG ELEMENTAL CALCIUM) 500 MG chewable tablet, Chew by mouth., Disp: , Rfl:  .  Calcium Carbonate-Simethicone 1000-60 MG CHEW, Chew by mouth., Disp: , Rfl:  .  Calcium-Phosphorus-Vitamin D (CITRACAL +D3 PO), Take 2 tablets by mouth daily., Disp: , Rfl:  .  Glucosamine HCl-MSM (SM GLUCOSAMINE HCL-MSM) 750-750 MG TABS, Take by mouth., Disp: , Rfl:  .  levothyroxine (SYNTHROID) 75 MCG tablet, TAKE 1 TABLET EVERY DAY ON EMPTY STOMACHWITH A GLASS OF WATER AT LEAST 30-60 MINBEFORE BREAKFAST, Disp: 90 tablet, Rfl: 1 .  Lifitegrast (XIIDRA) 5 % SOLN, , Disp: , Rfl:  .  LOTEMAX SM 0.38 % GEL, Place 1 drop into the right eye 4 (four) times daily., Disp: , Rfl:  .  Multiple Vitamins-Minerals (CENTRUM SILVER 50+WOMEN) TABS, Take 1 tablet by mouth daily., Disp: , Rfl:  .  mupirocin ointment (BACTROBAN) 2 %, APPLY TO AFFECTED AREAS TWICE DAILY, Disp: 22 g, Rfl: 0 .  ofloxacin (OCUFLOX) 0.3 % ophthalmic solution, Place 1 drop into the  right eye 4 (four) times daily., Disp: , Rfl:  .  Omega-3 Fatty Acids (FISH OIL PO), Take by mouth., Disp: , Rfl:  .  Probiotic Product (PROBIOTIC-10) CAPS, Take by mouth., Disp: , Rfl:  .  rosuvastatin (CRESTOR) 10 MG tablet, Take 1 tablet (10 mg total) by mouth daily., Disp: 90 tablet, Rfl: 1  No Known Allergies Review of Systems Objective:  There were no vitals filed for this visit.  General: Well developed, nourished, in no acute distress, alert and oriented x3   Dermatological: Skin is warm, dry and supple bilateral. Nails x 10 are well maintained; remaining integument appears unremarkable at this time. There are no open sores, no preulcerative lesions, no rash or signs of infection present.  Vascular: Dorsalis Pedis artery and Posterior Tibial artery pedal pulses are 2/4 bilateral with immedate capillary fill time. Pedal hair growth present. No varicosities and no lower extremity edema present bilateral.   Neruologic: Grossly intact via light touch bilateral. Vibratory intact via tuning fork bilateral. Protective threshold with Semmes Wienstein monofilament intact to all pedal sites bilateral. Patellar and Achilles deep tendon reflexes 2+ bilateral. No Babinski or clonus noted bilateral.   Musculoskeletal: No gross boney pedal deformities bilateral. No pain, crepitus, or limitation noted with foot and ankle range of motion bilateral. Muscular strength 5/5 in all groups tested bilateral.  Gait: Unassisted,  Nonantalgic.    Radiographs:  None taken  Assessment & Plan:   Assessment: Nail dystrophy  Plan: Samples of the skin and nail were taken today for pathologic evaluation be sent for pathologic evaluation follow-up with her in 1 month     Saori Umholtz T. [place], [place]

## 2019-11-01 ENCOUNTER — Ambulatory Visit: Payer: PPO

## 2019-11-01 ENCOUNTER — Other Ambulatory Visit: Payer: Self-pay

## 2019-11-01 DIAGNOSIS — M545 Low back pain, unspecified: Secondary | ICD-10-CM

## 2019-11-01 DIAGNOSIS — M25652 Stiffness of left hip, not elsewhere classified: Secondary | ICD-10-CM

## 2019-11-01 DIAGNOSIS — M25651 Stiffness of right hip, not elsewhere classified: Secondary | ICD-10-CM

## 2019-11-01 DIAGNOSIS — M25512 Pain in left shoulder: Secondary | ICD-10-CM

## 2019-11-01 NOTE — Therapy (Addendum)
[facility] [address] [place], [address] Phone: [phone]   Fax:  [phone]  Physical Therapy Treatment/ Progress Note  Patient Details  Name: Sherry Bernard MRN: [id number] Date of Birth: 1941-03-17 Referring Provider (PT): Nicki Reaper MD  Reporting Period: 09/13/2019- 12/02/2019  Encounter Date: 11/01/2019  PT End of Session - 11/01/19 0859    Visit Number  31    Number of Visits  33    Date for PT Re-Evaluation  10/24/19    Authorization Type  6/10    PT Start Time  0900    PT Stop Time  0945    PT Time Calculation (min)  45 min    Activity Tolerance  Patient tolerated treatment well;No increased pain    Behavior During Therapy  WFL for tasks assessed/performed       Past Medical History:  Diagnosis Date  . Colon polyp   . Hyperthyroidism     Past Surgical History:  Procedure Laterality Date  . APPENDECTOMY     child  . COLONOSCOPY WITH PROPOFOL N/A 06/26/2019   Procedure: COLONOSCOPY WITH PROPOFOL;  Surgeon: Lollie Sails, MD;  Location: [facility] ENDOSCOPY;  Service: Endoscopy;  Laterality: N/A;  . TONSILECTOMY, ADENOIDECTOMY, BILATERAL MYRINGOTOMY AND TUBES     child    There were no vitals filed for this visit.  Subjective Assessment - 11/01/19 0902    Subjective  Patient reports that back has improved but now left side low back is hurting, although less so. Day to day with pain now. Left knee started hurting a few days ago    Pertinent History  hyperthyroidism     Limitations  Sitting;Lifting    How long can you sit comfortably?  Hurts with going from sitting to standing - decreases after 10 min of walking     Diagnostic tests  no    Patient Stated Goals  to decrease pain    Currently in Pain?  Yes    Pain Score  3     Pain Location  Back    Pain Orientation  Lower;Left;Right    Pain Descriptors / Indicators  Aching    Pain Type  Chronic pain    Pain Onset  More than a month ago     Multiple Pain Sites  Yes    Pain Score  5    Pain Location  Knee    Pain Orientation  Left    Pain Descriptors / Indicators  Aching    Pain Onset  In the past 7 days    Pain Frequency  Intermittent      TREATMENT  Assessment of acute onset nontraumatic medial knee pain: Short and long adductors 4/5 and painless. Apley grind negative; no pain with step-up. Quad engagement even and strong with quad set. Mild edema noted in medial patellar space and palpable in medial knee around patella, possibly representing a plica irritation. Ballotment negative, Clark's negative, MCL intact. Gait demonstrates symmetrical deviations with increased lateral motion but reduced from baseline. Impression: mild irritation secondary to altered kinetic chain through RLE due to exercise. Pt advised to continue with knee brace PRN.   MT Myofascial release and trigger point release to R lumbar QL insertion, glute med/min, obliques Dry needling: one (1) 0.3 x 60 mm to QL insertion on R to release TrP patient educated on benefits and risks to treatment and verbally consent to treatement. Performed Manual therapy during dry needling  MT for pain relief and to improve tissue extensibility    TE Bridges x 20 with correction for knees Leg press B 65# RLE 3 x 10 - moved RLE to center of plate for improved simulation of SLS during gait Running man 3 x 10 Eccentric quad 8" step - note poor control of hip and knee  Glute med hip hikes x 10 L, 2 x 10 R Side plank hip raises x 5 R/L  TE for hip girdle strengthening    PT Education - 11/01/19 0859    Education Details  form/technique with exercise    Person(s) Educated  Patient    Methods  Explanation;Demonstration;Tactile cues;Verbal cues    Comprehension  Verbalized understanding;Returned demonstration;Verbal cues required;Tactile cues required          PT Long Term Goals - 09/12/19 1311      PT LONG TERM GOAL #1   Title  Patient will be independent  with HEP to continue benefits of therapy after discharge.    Baseline  Dependent with form/technique; Requires significant cueing with exercise performance; Heavy cues required for exercise performance.    Time  6    Period  Weeks    Status  Achieved      PT LONG TERM GOAL #2   Title  Patient will be able to sit for >1 hour without increase in pain to better be able to rest comfortably without increase in pain    Baseline  Increased pain after 5 to 10 min; 05/16/2019: Able to sit for 3 hours    Time  6    Period  Weeks    Status  Achieved      PT LONG TERM GOAL #3   Title  Patient will have a worst pain in the low back to under 3/10 to demonstrate improvement with functional activities.    Baseline  7/10 worst pain; 05/16/2019: 5/10; 07/04/2019: 3/10    Time  6    Period  Weeks    Status  On-going      PT LONG TERM GOAL #4   Title  Patient will have no increase in pain when standing after sitting for >1hour to better be able to drive for longer periods of time.    Baseline  5 minutes of sitting; 09/12/19 1 hr    Time  6    Period  Weeks    Status  Achieved      PT LONG TERM GOAL #5   Title  Patient will be able to perform overhead shoulder flexion to AROM to reach the unaffected side around (155 degrees of flexion) to reach the higher shelf.    Baseline  125 degrees of flexion AROM; 12/1 WFL    Time  6    Period  Weeks    Status  Achieved      PT LONG TERM GOAL #6   Title  Patient will have a worst pain of 2/10 with overhead shoulder flexion to better be able to lift items overhead.    Baseline  5/10; 12/1 reports 4/10    Time  6    Period  Weeks    Status  New      PT LONG TERM GOAL #7   Title  Patient will be able to perform resisted  L shoulder ER to the same extent as the unaffected side to better be able to perform overhead lifting.    Baseline  4/5 on the L shoulder; 4/5 L  shoulder    Time  6    Period  Weeks    Status  New            Plan - 11/01/19 1104     Clinical Impression Statement  Patient progressing towards long term goals with improvement in shoulder pain, strength and functional movement. Although patient is improving, she continues ot have intermittent LBP which is improved compared to previous session. No provoking motion or positive test is evident with acute-onset L knee pain; pt advised to continue with knee brace PRN. Demonstrates reduced tissue tension through R lumbar spine. Although increased motion in coronal plane remains evident with gait, deviations are more symmetrical with reduced reliance on LLE indicating increased strength on R. Poor control with eccentric step-down suggests remaining deficits in quad strength. Pain shows gradual improvement. Patient will benefit from skilled physical therapy to improve R-sided limitations and achieve PLOF.    Personal Factors and Comorbidities  Age;Comorbidity 1    Comorbidities  hyperthyroidism    Examination-Activity Limitations  Bend;Sit    Examination-Participation Restrictions  Community Activity    Stability/Clinical Decision Making  Stable/Uncomplicated    Rehab Potential  Good    PT Frequency  2x / week    PT Duration  6 weeks    PT Treatment/Interventions  Therapeutic exercise;Therapeutic activities;Balance training;Neuromuscular re-education;Cognitive remediation;Gait training;Electrical Stimulation;Moist Heat;Stair training;Patient/family education;Dry needling;Passive range of motion;Joint Manipulations;Spinal Manipulations    PT Next Visit Plan  Progress asymmetric core therex; UE reassessment    PT Home Exercise Plan  QL stretch, sidebending 3#, R glute med hip hikes    Consulted and Agree with Plan of Care  Patient       Patient will benefit from skilled therapeutic intervention in order to improve the following deficits and impairments:  Abnormal gait, Increased fascial restricitons, Pain, Decreased mobility, Decreased coordination, Increased muscle spasms, Decreased activity  tolerance, Decreased strength, Decreased range of motion, Decreased endurance, Difficulty walking  Visit Diagnosis: Acute bilateral low back pain without sciatica  Stiffness of right hip, not elsewhere classified  Left shoulder pain, unspecified chronicity  Stiffness of left hip, not elsewhere classified     Problem List Patient Active Problem List   Diagnosis Date Noted  . Finger infection 03/05/2019  . Skin infection 01/04/2019  . URI (upper respiratory infection) 01/04/2019  . Healthcare maintenance 12/05/2018  . Left shoulder pain 03/12/2018  . Anxiety 08/18/2017  . Hematuria 08/16/2017  . Low back pain 01/10/2017  . Encounter for counseling 11/06/2016  . Cough 10/06/2016  . Diarrhea 10/06/2016  . Fibrocystic breast disease 05/05/2016  . Osteopenia 05/05/2016  . Cyst of pancreas 08/13/2015  . 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 Acquired hypothyroidism 08/02/2014  . Rhytides 11/20/2013  . Postmenopausal 10/13/1991    Virgia Land, SPT 11/01/2019, 11:12 AM  [facility] [address] [place], [address] Phone: [phone]   Fax:  [phone]  Name: Sherry Bernard MRN: [id number] Date of Birth: 1940/12/15

## 2019-11-02 NOTE — Addendum Note (Signed)
Addended by: Blain Pais on: 11/02/2019 08:28 AM   Modules accepted: Orders

## 2019-11-03 ENCOUNTER — Encounter: Payer: PPO | Admitting: Plastic Surgery

## 2019-11-06 DIAGNOSIS — H40031 Anatomical narrow angle, right eye: Secondary | ICD-10-CM | POA: Diagnosis not present

## 2019-11-14 ENCOUNTER — Other Ambulatory Visit: Payer: Self-pay

## 2019-11-14 ENCOUNTER — Ambulatory Visit: Payer: PPO | Attending: Internal Medicine

## 2019-11-14 DIAGNOSIS — M25562 Pain in left knee: Secondary | ICD-10-CM | POA: Insufficient documentation

## 2019-11-14 DIAGNOSIS — M545 Low back pain, unspecified: Secondary | ICD-10-CM

## 2019-11-14 DIAGNOSIS — M25651 Stiffness of right hip, not elsewhere classified: Secondary | ICD-10-CM

## 2019-11-14 NOTE — Therapy (Signed)
[facility] [address] [place], [address] Phone: [phone]   Fax:  [phone]  Physical Therapy Treatment  Patient Details  Name: Sherry Bernard MRN: [id number] Date of Birth: 1941/02/18 Referring Provider (PT): Nicki Reaper MD   Encounter Date: 11/14/2019  PT End of Session - 11/14/19 1654    Visit Number  32    Number of Visits  33    Date for PT Re-Evaluation  10/24/19    Authorization Type  7/10    PT Start Time  1430    PT Stop Time  1515    PT Time Calculation (min)  45 min    Activity Tolerance  Patient tolerated treatment well;No increased pain    Behavior During Therapy  WFL for tasks assessed/performed       Past Medical History:  Diagnosis Date  . Colon polyp   . Hyperthyroidism     Past Surgical History:  Procedure Laterality Date  . APPENDECTOMY     child  . COLONOSCOPY WITH PROPOFOL N/A 06/26/2019   Procedure: COLONOSCOPY WITH PROPOFOL;  Surgeon: Lollie Sails, MD;  Location: [facility] ENDOSCOPY;  Service: Endoscopy;  Laterality: N/A;  . TONSILECTOMY, ADENOIDECTOMY, BILATERAL MYRINGOTOMY AND TUBES     child    There were no vitals filed for this visit.  Subjective Assessment - 11/14/19 1649    Subjective  Patient reports that she has no back pain.  Since she was here last time the patient states she was doing her daily walking at the mall and she caught her R leg on the ground and strained her R knee in the process.  She is very fearful that she "is going to need surgery".  Prior to this injury she was wearing a soft knee compression sleeve too and she reports she is not sure if this is what irritated her knee because it felt "tight".  Other relevant history reported is that she typically walks 10-20,000 steps per day at the mall.  she has been doing this daily for a very long time now.    Pertinent History  hyperthyroidism     Limitations  Sitting;Lifting    How long can you sit  comfortably?  Hurts with going from sitting to standing - decreases after 10 min of walking     Diagnostic tests  no    Patient Stated Goals  to decrease pain    Pain Onset  More than a month ago    Pain Score  6    Pain Location  Knee    Pain Orientation  Left    Pain Descriptors / Indicators  Sore    Pain Type  Acute pain    Pain Onset  In the past 7 days        TREATMENT   .   AROM  Flexion - WNL P! Extension - WNL P!  PROM Flexion - WNL  Extension - WNL   MMT Knee flexion - 4/5 P! (R)  5/5  (L) Knee extension - 5/5  (R)  5/5 (L) Palpation: TTP over semitendinosis/semimembranosus, posterior knee capsule mild tenderness over TF joint line   Observation: mild swelling noted over proximal patella and distal quadriceps  Walking: antalgic gait, decreased loading in knee extension     TE Physio ball rollouts - 3 minutes Walking 200 ft PROM Knee flexion x 10   MT  Medial/Lateral/Inferior/Superior Patellofemoral joint mobs, grade 1, 2 - 2 x 30  second each direction Anterior/Posterior/Medial/Lateral Tibiofemoral joint mobs, grade 1,2 - 2 x30 seconds ea direction STM work over distal quads and swelling drainage - 2 minutes   Knee Screen was conducted as patient states she had a fall last week; however, during session she more clearly described it as a "stumble" and caught herself without actually falling.   Therapeutic exercise and manual therapy utilized to help reduce pain and swelling at the knee and increase motion.       PT Education - 11/14/19 1654    Education Details  form/technique w/ exercise, diagnosis on knee screen    Person(s) Educated  Patient    Methods  Explanation;Demonstration;Verbal cues    Comprehension  Verbalized understanding;Returned demonstration;Verbal cues required          PT Long Term Goals - 11/02/19 0817      PT LONG TERM GOAL #1   Title  Patient will be independent with HEP to continue benefits of therapy after discharge.     Baseline  Dependent with form/technique; Requires significant cueing with exercise performance; Heavy cues required for exercise performance.    Time  6    Period  Weeks    Status  Achieved      PT LONG TERM GOAL #2   Title  Patient will be able to sit for >1 hour without increase in pain to better be able to rest comfortably without increase in pain    Baseline  Increased pain after 5 to 10 min; 05/16/2019: Able to sit for 3 hours    Time  6    Period  Weeks    Status  Achieved      PT LONG TERM GOAL #3   Title  Patient will have a worst pain in the low back to under 3/10 to demonstrate improvement with functional activities.    Baseline  7/10 worst pain; 05/16/2019: 5/10; 07/04/2019: 3/10; 11/02/2019: 4/10    Time  6    Period  Weeks    Status  On-going      PT LONG TERM GOAL #4   Title  Patient will have no increase in pain when standing after sitting for >1hour to better be able to drive for longer periods of time.    Baseline  5 minutes of sitting; 09/12/19 1 hr    Time  6    Period  Weeks    Status  Achieved      PT LONG TERM GOAL #5   Title  Patient will be able to perform overhead shoulder flexion to AROM to reach the unaffected side around (155 degrees of flexion) to reach the higher shelf.    Baseline  125 degrees of flexion AROM; 12/1 WFL    Time  6    Period  Weeks    Status  Achieved      PT LONG TERM GOAL #6   Title  Patient will have a worst pain of 2/10 with overhead shoulder flexion to better be able to lift items overhead.    Baseline  5/10; 12/1 reports 4/10; 11/01/2019: 3/10    Time  6    Period  Weeks    Status  On-going      PT LONG TERM GOAL #7   Title  Patient will be able to perform resisted  L shoulder ER to the same extent as the unaffected side to better be able to perform overhead lifting.    Baseline  4/5 on the L shoulder;  4/5 L shoulder; 11/01/2019: 4+/5: L shoulder    Time  6    Period  Weeks    Status  On-going            Plan -  11/14/19 1656    Clinical Impression Statement  Patient presents to PT with report of an acute L knee injury due to a fall that she sustained while walking. No ligamentous or structural damage was noted when evaluating the knee  today. Patient had mild swelling over proximal patella and distal quadricep muscle group as well as tenderness over distal semitendinosis/semimembraneous. Patient showed an ability to flex and extend the knee indicating no muscular tearing or serious damage. Patient was given AROM exercises of the knee to help decrease swelling in the knee and retain ROM. Patient had an antalgic gait and was educated on techniques to facilitate being able to ambulate with normal gait mechanics at home this week.    Personal Factors and Comorbidities  Age;Comorbidity 1    Comorbidities  hyperthyroidism    Examination-Activity Limitations  Bend;Sit    Examination-Participation Restrictions  Community Activity    Stability/Clinical Decision Making  Stable/Uncomplicated    Rehab Potential  Good    PT Frequency  2x / week    PT Duration  6 weeks    PT Treatment/Interventions  Therapeutic exercise;Therapeutic activities;Balance training;Neuromuscular re-education;Cognitive remediation;Gait training;Electrical Stimulation;Moist Heat;Stair training;Patient/family education;Dry needling;Passive range of motion;Joint Manipulations;Spinal Manipulations    PT Next Visit Plan  Progress asymmetric core therex; UE reassessment; follow up regarding her progress with L knee after today's session    PT Home Exercise Plan  QL stretch, sidebending 3#, R glute med hip hikes    Consulted and Agree with Plan of Care  Patient       Patient will benefit from skilled therapeutic intervention in order to improve the following deficits and impairments:  Abnormal gait, Increased fascial restricitons, Pain, Decreased mobility, Decreased coordination, Increased muscle spasms, Decreased activity tolerance, Decreased  strength, Decreased range of motion, Decreased endurance, Difficulty walking  Visit Diagnosis: Stiffness of right hip, not elsewhere classified  Acute bilateral low back pain without sciatica  Acute pain of left knee     Problem List Patient Active Problem List   Diagnosis Date Noted  . Finger infection 03/05/2019  . Skin infection 01/04/2019  . URI (upper respiratory infection) 01/04/2019  . Healthcare maintenance 12/05/2018  . Left shoulder pain 03/12/2018  . Anxiety 08/18/2017  . Hematuria 08/16/2017  . Low back pain 01/10/2017  . Encounter for counseling 11/06/2016  . Cough 10/06/2016  . Diarrhea 10/06/2016  . Fibrocystic breast disease 05/05/2016  . Osteopenia 05/05/2016  . Cyst of pancreas 08/13/2015  . 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 Acquired hypothyroidism 08/02/2014  . Rhytides 11/20/2013  . Postmenopausal 10/13/1991    Gloriann Loan, SPT 11/14/2019, 6:39 PM  Merdis Delay, PT, DPT   [facility] [address], [place], [address] Phone: [phone]   Fax:  [phone]  Name: Sherry Bernard MRN: [id number] Date of Birth: [birth year]

## 2019-11-15 ENCOUNTER — Encounter: Payer: Self-pay | Admitting: Internal Medicine

## 2019-11-15 ENCOUNTER — Ambulatory Visit (INDEPENDENT_AMBULATORY_CARE_PROVIDER_SITE_OTHER): Payer: PPO | Admitting: Internal Medicine

## 2019-11-15 ENCOUNTER — Telehealth: Payer: Self-pay | Admitting: Internal Medicine

## 2019-11-15 ENCOUNTER — Ambulatory Visit
Admission: RE | Admit: 2019-11-15 | Discharge: 2019-11-15 | Disposition: A | Discharge status: Home / Self Care | Payer: PPO | Source: Ambulatory Visit | Attending: Internal Medicine | Admitting: Internal Medicine

## 2019-11-15 DIAGNOSIS — M7989 Other specified soft tissue disorders: Secondary | ICD-10-CM | POA: Insufficient documentation

## 2019-11-15 DIAGNOSIS — M25562 Pain in left knee: Secondary | ICD-10-CM

## 2019-11-15 DIAGNOSIS — M79605 Pain in left leg: Secondary | ICD-10-CM | POA: Diagnosis not present

## 2019-11-15 NOTE — Telephone Encounter (Signed)
Patient aware and would like referral to ortho- Dr Marry Guan

## 2019-11-15 NOTE — Telephone Encounter (Signed)
Referral placed for ortho referral.   

## 2019-11-15 NOTE — Telephone Encounter (Signed)
Please notify pt that her ultrasound is negative for DVT (clot).  Will need f/u with ortho as we discussed.  I

## 2019-11-15 NOTE — Telephone Encounter (Signed)
Pt negative for DVT. Results in Epic.

## 2019-11-15 NOTE — Telephone Encounter (Signed)
Amy at [facility] called and stated that pt's leg was negative for DVT. They told pt that PCP would follow up

## 2019-11-15 NOTE — Progress Notes (Signed)
Patient ID: Sherry Bernard, female   DOB: [birth year], 79 y.o.   MRN: [id number]   Virtual Visit via video Note  This visit type was conducted due to national recommendations for restrictions regarding the COVID-19 pandemic (e.g. social distancing).  This format is felt to be most appropriate for this patient at this time.  All issues noted in this document were discussed and addressed.  No physical exam was performed (except for noted visual exam findings with Video Visits).   I connected with Sherry Bernard by a video enabled telemedicine application and verified that I am speaking with the correct person using two identifiers. Location patient: home Location provider: work  Persons participating in the virtual visit: patient, provider  The limitations, risks, security and privacy concerns of performing an evaluation and management service by video and the availability of in person appointments have been discussed.  The patient expressed understanding and agreed to proceed.   Reason for visit: work in appt.   HPI: Work in appt for leg/knee pain.  Localized - right side of knee.  Started last week.  Walking at mall. Almost fell.  Her leg "gave away".  She grabbed it.  Not red. Not hot.  Walking with limp.  Increased pain.  Had been using a sleeve.  Felt too tight.  Taking tylenol.  Using heat and ice.  Left leg larger than right.  No chest pain or sob reported.    ROS: See pertinent positives and negatives per HPI.  Past Medical History:  Diagnosis Date  . Colon polyp   . Hyperthyroidism     Past Surgical History:  Procedure Laterality Date  . APPENDECTOMY     child  . COLONOSCOPY WITH PROPOFOL N/A 06/26/2019   Procedure: COLONOSCOPY WITH PROPOFOL;  Surgeon: Lollie Sails, MD;  Location: [facility] ENDOSCOPY;  Service: Endoscopy;  Laterality: N/A;  . TONSILECTOMY, ADENOIDECTOMY, BILATERAL MYRINGOTOMY AND TUBES     child    Family History  Problem Relation Age of Onset  . Breast  cancer Mother     SOCIAL HX: reviewed.    Current Outpatient Medications:  .  Biotin 5 MG CAPS, Take by mouth., Disp: , Rfl:  .  calcium carbonate (TUMS - DOSED IN MG ELEMENTAL CALCIUM) 500 MG chewable tablet, Chew by mouth., Disp: , Rfl:  .  Calcium Carbonate-Simethicone 1000-60 MG CHEW, Chew by mouth., Disp: , Rfl:  .  Calcium-Phosphorus-Vitamin D (CITRACAL +D3 PO), Take 2 tablets by mouth daily., Disp: , Rfl:  .  Glucosamine HCl-MSM (SM GLUCOSAMINE HCL-MSM) 750-750 MG TABS, Take by mouth., Disp: , Rfl:  .  levothyroxine (SYNTHROID) 75 MCG tablet, TAKE 1 TABLET EVERY DAY ON EMPTY STOMACHWITH A GLASS OF WATER AT LEAST 30-60 MINBEFORE BREAKFAST, Disp: 90 tablet, Rfl: 1 .  Lifitegrast (XIIDRA) 5 % SOLN, , Disp: , Rfl:  .  LOTEMAX SM 0.38 % GEL, Place 1 drop into the right eye 4 (four) times daily., Disp: , Rfl:  .  Multiple Vitamins-Minerals (CENTRUM SILVER 50+WOMEN) TABS, Take 1 tablet by mouth daily., Disp: , Rfl:  .  mupirocin ointment (BACTROBAN) 2 %, APPLY TO AFFECTED AREAS TWICE DAILY, Disp: 22 g, Rfl: 0 .  ofloxacin (OCUFLOX) 0.3 % ophthalmic solution, Place 1 drop into the right eye 4 (four) times daily., Disp: , Rfl:  .  Omega-3 Fatty Acids (FISH OIL PO), Take by mouth., Disp: , Rfl:  .  Probiotic Product (PROBIOTIC-10) CAPS, Take by mouth., Disp: , Rfl:  .  rosuvastatin (  CRESTOR) 10 MG tablet, Take 1 tablet (10 mg total) by mouth daily., Disp: 90 tablet, Rfl: 1  EXAM:  GENERAL: alert, oriented, appears well and in no acute distress  HEENT: atraumatic, conjunttiva clear, no obvious abnormalities on inspection of external nose and ears  NECK: normal movements of the head and neck  LUNGS: on inspection no signs of respiratory distress, breathing rate appears normal, no obvious gross SOB, gasping or wheezing  CV: no obvious cyanosis  MS: moves all visible extremities without noticeable abnormality.  Left leg - larger than right - increased soft tissue swelling left knee/leg.   No increased erythema.  Increased pain with walking. Observed her walking - walking with limp.    PSYCH/NEURO: pleasant and cooperative, no obvious depression or anxiety, speech and thought processing grossly intact  ASSESSMENT AND PLAN:  Discussed the following assessment and plan:  Left leg swelling Left leg/knee swelling and pain.  Given left leg more swollen when compared to right, will obtain lower extremity ultrasound to confirm no DVT.  Also to evaluate for possible Baker's cyst, etc.  Tylenol as directed.  Continue ice.  Discussed ortho referral.     Orders Placed This Encounter  Procedures  . US Venous Img Lower Unilateral Left    Standing Status:   Future    Number of Occurrences:   1    Standing Expiration Date:   01/12/2021    Order Specific Question:   Reason for Exam (SYMPTOM  OR DIAGNOSIS REQUIRED)    Answer:   left leg pain and swelling    Order Specific Question:   Preferred imaging location?    Answer:   [facility]    Order Specific Question:   Call Results- Best Contact Number?    Answer:   [phone]- pt can leave     I discussed the assessment and treatment plan with the patient. The patient was provided an opportunity to ask questions and all were answered. The patient agreed with the plan and demonstrated an understanding of the instructions.   The patient was advised to call back or seek an in-person evaluation if the symptoms worsen or if the condition fails to improve as anticipated.   Einar Pheasant, MD

## 2019-11-17 DIAGNOSIS — M1712 Unilateral primary osteoarthritis, left knee: Secondary | ICD-10-CM | POA: Diagnosis not present

## 2019-11-17 DIAGNOSIS — M25562 Pain in left knee: Secondary | ICD-10-CM | POA: Diagnosis not present

## 2019-11-17 DIAGNOSIS — M25462 Effusion, left knee: Secondary | ICD-10-CM | POA: Diagnosis not present

## 2019-11-19 ENCOUNTER — Encounter: Payer: Self-pay | Admitting: Internal Medicine

## 2019-11-19 NOTE — Assessment & Plan Note (Signed)
Left leg/knee swelling and pain.  Given left leg more swollen when compared to right, will obtain lower extremity ultrasound to confirm no DVT.  Also to evaluate for possible Baker's cyst, etc.  Tylenol as directed.  Continue ice.  Discussed ortho referral.

## 2019-11-22 DIAGNOSIS — H40032 Anatomical narrow angle, left eye: Secondary | ICD-10-CM | POA: Diagnosis not present

## 2019-11-22 DIAGNOSIS — Z1231 Encounter for screening mammogram for malignant neoplasm of breast: Secondary | ICD-10-CM | POA: Diagnosis not present

## 2019-11-22 LAB — HM MAMMOGRAPHY

## 2019-11-27 ENCOUNTER — Ambulatory Visit: Payer: PPO | Admitting: Podiatry

## 2019-11-27 ENCOUNTER — Other Ambulatory Visit: Payer: Self-pay

## 2019-11-27 ENCOUNTER — Encounter: Payer: Self-pay | Admitting: Podiatry

## 2019-11-27 DIAGNOSIS — L603 Nail dystrophy: Secondary | ICD-10-CM | POA: Diagnosis not present

## 2019-11-27 NOTE — Progress Notes (Signed)
She presents today with her husband Sherry Bernard to discuss findings of her pathology result.  Objective: Pathology result does demonstrate yeast and saprophytic fungus.  Assessment: Yeast and saprophytic fungus.  Plan: Discussed the pros and cons of oral therapy versus laser therapy.  At this point were going to try laser therapy and she has been set up with Caryl Pina for the next few weeks.

## 2019-11-28 ENCOUNTER — Ambulatory Visit: Payer: PPO

## 2019-11-29 ENCOUNTER — Other Ambulatory Visit: Payer: Self-pay | Admitting: Sports Medicine

## 2019-11-29 DIAGNOSIS — M1712 Unilateral primary osteoarthritis, left knee: Secondary | ICD-10-CM

## 2019-11-29 DIAGNOSIS — M25462 Effusion, left knee: Secondary | ICD-10-CM

## 2019-11-29 DIAGNOSIS — M25562 Pain in left knee: Secondary | ICD-10-CM | POA: Diagnosis not present

## 2019-11-30 ENCOUNTER — Ambulatory Visit
Admission: RE | Admit: 2019-11-30 | Discharge: 2019-11-30 | Disposition: A | Discharge status: Home / Self Care | Payer: PPO | Source: Ambulatory Visit | Attending: Sports Medicine | Admitting: Sports Medicine

## 2019-11-30 ENCOUNTER — Ambulatory Visit: Payer: PPO

## 2019-11-30 ENCOUNTER — Other Ambulatory Visit: Payer: Self-pay

## 2019-11-30 DIAGNOSIS — M25462 Effusion, left knee: Secondary | ICD-10-CM | POA: Diagnosis not present

## 2019-11-30 DIAGNOSIS — M25562 Pain in left knee: Secondary | ICD-10-CM | POA: Insufficient documentation

## 2019-11-30 DIAGNOSIS — M1712 Unilateral primary osteoarthritis, left knee: Secondary | ICD-10-CM | POA: Insufficient documentation

## 2019-12-04 ENCOUNTER — Telehealth: Payer: Self-pay | Admitting: Internal Medicine

## 2019-12-04 NOTE — Telephone Encounter (Signed)
Pt wants PCP to advise her on who to go to for knee surgery.

## 2019-12-05 ENCOUNTER — Encounter: Payer: PPO | Admitting: Plastic Surgery

## 2019-12-05 ENCOUNTER — Ambulatory Visit: Payer: PPO

## 2019-12-06 NOTE — Telephone Encounter (Signed)
I can place order for referral.  Does she have a preference - (in town or travel to [place]?).

## 2019-12-06 NOTE — Telephone Encounter (Signed)
Pt is seeing ortho at [facility] clinic. Would like to have referral to another ortho MD to get a second opinion about possible knee surgery.

## 2019-12-07 ENCOUNTER — Ambulatory Visit: Payer: PPO

## 2019-12-07 ENCOUNTER — Telehealth: Payer: Self-pay

## 2019-12-07 DIAGNOSIS — X58XXXA Exposure to other specified factors, initial encounter: Secondary | ICD-10-CM | POA: Diagnosis not present

## 2019-12-07 DIAGNOSIS — M2392 Unspecified internal derangement of left knee: Secondary | ICD-10-CM | POA: Diagnosis not present

## 2019-12-07 DIAGNOSIS — M25462 Effusion, left knee: Secondary | ICD-10-CM | POA: Diagnosis not present

## 2019-12-07 DIAGNOSIS — S83242A Other tear of medial meniscus, current injury, left knee, initial encounter: Secondary | ICD-10-CM | POA: Diagnosis not present

## 2019-12-07 DIAGNOSIS — Z01818 Encounter for other preprocedural examination: Secondary | ICD-10-CM | POA: Diagnosis not present

## 2019-12-07 DIAGNOSIS — M85862 Other specified disorders of bone density and structure, left lower leg: Secondary | ICD-10-CM | POA: Diagnosis not present

## 2019-12-07 DIAGNOSIS — Y9301 Activity, walking, marching and hiking: Secondary | ICD-10-CM | POA: Diagnosis not present

## 2019-12-07 NOTE — Telephone Encounter (Signed)
Patient notified of results.

## 2019-12-07 NOTE — Telephone Encounter (Signed)
-----   Message from Andres Ege, RN sent at 12/06/2019  2:58 PM EST ----- Janace Hoard, please assist pt. Marcy Siren ----- Message ----- From: Garrel Ridgel, [place] Sent: 11/14/2019  12:21 PM EST To: Andres Ege, RN  Positive for fungus and wait for next appointment.

## 2019-12-08 NOTE — Telephone Encounter (Signed)
Patient saw another ortho MD yesterday through [facility]. Referral is no longer needed.

## 2019-12-08 NOTE — Telephone Encounter (Signed)
Left detailed message for patient to let me know.

## 2019-12-12 ENCOUNTER — Encounter: Payer: Self-pay | Admitting: Plastic Surgery

## 2019-12-12 ENCOUNTER — Other Ambulatory Visit: Payer: Self-pay

## 2019-12-12 ENCOUNTER — Ambulatory Visit: Payer: PPO

## 2019-12-12 ENCOUNTER — Encounter: Payer: PPO | Admitting: Plastic Surgery

## 2019-12-12 ENCOUNTER — Ambulatory Visit (INDEPENDENT_AMBULATORY_CARE_PROVIDER_SITE_OTHER): Payer: Self-pay | Admitting: Plastic Surgery

## 2019-12-12 VITALS — BP 135/92 | HR 78 | Temp 98.1°F

## 2019-12-12 DIAGNOSIS — Z719 Counseling, unspecified: Secondary | ICD-10-CM

## 2019-12-12 NOTE — Progress Notes (Signed)

## 2019-12-13 ENCOUNTER — Ambulatory Visit: Payer: PPO

## 2019-12-15 ENCOUNTER — Encounter: Payer: PPO | Admitting: Plastic Surgery

## 2019-12-22 ENCOUNTER — Ambulatory Visit: Payer: PPO | Admitting: Podiatry

## 2019-12-22 ENCOUNTER — Other Ambulatory Visit: Payer: Self-pay

## 2019-12-22 DIAGNOSIS — B351 Tinea unguium: Secondary | ICD-10-CM

## 2019-12-22 DIAGNOSIS — L603 Nail dystrophy: Secondary | ICD-10-CM

## 2019-12-22 NOTE — Patient Instructions (Signed)

## 2019-12-22 NOTE — Progress Notes (Signed)
Patient presents today for the 1st laser treatment. Diagnosed with mycotic nail infection by Dr. Milinda Pointer. Toenail most affected the hallux right.  All other systems are negative.  Nails were filed thin. Laser therapy was administered to 1st toenails right and patient tolerated the treatment well. All safety precautions were in place.    Follow up in 4 weeks for laser # 2.  Picture of nails taken today to document visual progress

## 2019-12-24 DIAGNOSIS — M25562 Pain in left knee: Secondary | ICD-10-CM | POA: Diagnosis not present

## 2019-12-24 DIAGNOSIS — M1712 Unilateral primary osteoarthritis, left knee: Secondary | ICD-10-CM | POA: Diagnosis not present

## 2020-01-08 ENCOUNTER — Encounter: Payer: Self-pay | Admitting: Internal Medicine

## 2020-01-08 ENCOUNTER — Other Ambulatory Visit: Payer: Self-pay

## 2020-01-08 ENCOUNTER — Ambulatory Visit (INDEPENDENT_AMBULATORY_CARE_PROVIDER_SITE_OTHER): Payer: PPO | Admitting: Internal Medicine

## 2020-01-08 VITALS — BP 128/76 | HR 90 | Temp 97.8°F | Resp 16 | Ht 64.0 in | Wt 123.4 lb

## 2020-01-08 DIAGNOSIS — R197 Diarrhea, unspecified: Secondary | ICD-10-CM | POA: Diagnosis not present

## 2020-01-08 DIAGNOSIS — L659 Nonscarring hair loss, unspecified: Secondary | ICD-10-CM | POA: Diagnosis not present

## 2020-01-08 DIAGNOSIS — R079 Chest pain, unspecified: Secondary | ICD-10-CM | POA: Diagnosis not present

## 2020-01-08 DIAGNOSIS — E039 Hypothyroidism, unspecified: Secondary | ICD-10-CM | POA: Diagnosis not present

## 2020-01-08 DIAGNOSIS — E7800 Pure hypercholesterolemia, unspecified: Secondary | ICD-10-CM | POA: Diagnosis not present

## 2020-01-08 DIAGNOSIS — D126 Benign neoplasm of colon, unspecified: Secondary | ICD-10-CM

## 2020-01-08 DIAGNOSIS — Z01818 Encounter for other preprocedural examination: Secondary | ICD-10-CM | POA: Diagnosis not present

## 2020-01-08 LAB — CBC WITH DIFFERENTIAL/PLATELET
Basophils Absolute: 0.1 10*3/uL (ref 0.0–0.1)
Basophils Relative: 0.6 % (ref 0.0–3.0)
Eosinophils Absolute: 0.1 10*3/uL (ref 0.0–0.7)
Eosinophils Relative: 0.9 % (ref 0.0–5.0)
HCT: 38.5 % (ref 36.0–46.0)
Hemoglobin: 13.3 g/dL (ref 12.0–15.0)
Lymphocytes Relative: 12.6 % (ref 12.0–46.0)
Lymphs Abs: 1.3 10*3/uL (ref 0.7–4.0)
MCHC: 34.5 g/dL (ref 30.0–36.0)
MCV: 92.3 fl (ref 78.0–100.0)
Monocytes Absolute: 0.8 10*3/uL (ref 0.1–1.0)
Monocytes Relative: 7.3 % (ref 3.0–12.0)
Neutro Abs: 8.3 10*3/uL — ABNORMAL HIGH (ref 1.4–7.7)
Neutrophils Relative %: 78.6 % — ABNORMAL HIGH (ref 43.0–77.0)
Platelets: 267 10*3/uL (ref 150.0–400.0)
RBC: 4.17 Mil/uL (ref 3.87–5.11)
RDW: 14.5 % (ref 11.5–15.5)
WBC: 10.6 10*3/uL — ABNORMAL HIGH (ref 4.0–10.5)

## 2020-01-08 LAB — COMPREHENSIVE METABOLIC PANEL
ALT: 26 U/L (ref 0–35)
AST: 26 U/L (ref 0–37)
Albumin: 4.4 g/dL (ref 3.5–5.2)
Alkaline Phosphatase: 65 U/L (ref 39–117)
BUN: 18 mg/dL (ref 6–23)
CO2: 29 mEq/L (ref 19–32)
Calcium: 9.8 mg/dL (ref 8.4–10.5)
Chloride: 101 mEq/L (ref 96–112)
Creatinine, Ser: 0.67 mg/dL (ref 0.40–1.20)
GFR: 85.08 mL/min (ref >60.00)
Glucose, Bld: 93 mg/dL (ref 70–99)
Potassium: 3.7 mEq/L (ref 3.5–5.1)
Sodium: 138 mEq/L (ref 135–145)
Total Bilirubin: 0.3 mg/dL (ref 0.2–1.2)
Total Protein: 7.1 g/dL (ref 6.0–8.3)

## 2020-01-08 LAB — LIPID PANEL
Cholesterol: 136 mg/dL (ref 0–200)
HDL: 73.4 mg/dL (ref >39.00)
LDL Cholesterol: 46 mg/dL (ref 0–99)
NonHDL: 62.27
Total CHOL/HDL Ratio: 2
Triglycerides: 81 mg/dL (ref 0.0–149.0)
VLDL: 16.2 mg/dL (ref 0.0–40.0)

## 2020-01-08 LAB — TSH: TSH: 5.01 u[IU]/mL — ABNORMAL HIGH (ref 0.35–4.50)

## 2020-01-08 NOTE — Progress Notes (Signed)
Patient ID: Sherry Bernard, female   DOB: Jul 07, 1941, 79 y.o.   MRN: [id number]   Subjective:    Patient ID: Sherry Bernard, female    DOB: August 27, 1941, 79 y.o.   MRN: [id number]  HPI This visit occurred during the SARS-CoV-2 public health emergency.  Safety protocols were in place, including screening questions prior to the visit, additional usage of staff PPE, and extensive cleaning of exam room while observing appropriate contact time as indicated for disinfecting solutions.  Patient here for a scheduled follow up.  She has been doing relatively well except for left knee pain and swelling.  Seeing ortho. 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  She is planning to have knee surgery 01/24/20.  On questioning her, she denies any sob, cough or congestion.  Does report noticed some some chest pain that occurs if she presses on her chest.  No chest pain or sob with increased activity or exertion.  No acid reflux.  Taking benefiber.  Bowels are moving.  No diarrhea.  No abdominal pain.  Had questions about hair loss and a scalp lesion.  (single lesion).  Discussed dermatology evaluation.  No headache or dizziness reported.    Past Medical History:  Diagnosis Date  . Colon polyp   . Hyperthyroidism    Past Surgical History:  Procedure Laterality Date  . APPENDECTOMY     child  . COLONOSCOPY WITH PROPOFOL N/A 06/26/2019   Procedure: COLONOSCOPY WITH PROPOFOL;  Surgeon: Lollie Sails, MD;  Location: [facility] ENDOSCOPY;  Service: Endoscopy;  Laterality: N/A;  . TONSILECTOMY, ADENOIDECTOMY, BILATERAL MYRINGOTOMY AND TUBES     child   Family History  Problem Relation Age of Onset  . Breast cancer Mother    Social History   Socioeconomic History  . Marital status: Married    Spouse name: Not on file  . Number of children: 3  . Years of education: Not on file  .  Highest education level: Not on file  Occupational History  . Occupation: retired  Tobacco Use  . Smoking status: Never Smoker  . Smokeless tobacco: Never Used  Substance and Sexual Activity  . Alcohol use: Yes    Alcohol/week: 2.0 - 3.0 standard drinks    Types: 2 - 3 Glasses of wine per week    Comment: socially  . Drug use: No  . Sexual activity: Yes  Other Topics Concern  . Not on file  Social History Narrative  . Not on file   Social Determinants of Health   Financial Resource Strain:   . Difficulty of Paying Living Expenses:   Food Insecurity:   . Worried About Charity fundraiser in the Last Year:   . Arboriculturist in the Last Year:   Transportation Needs:   . Film/video editor (Medical):   Marland Kitchen Lack of Transportation (Non-Medical):   Physical Activity:   . Days of Exercise per Week:   . Minutes of Exercise per Session:   Stress:   . Feeling of Stress :   Social Connections:   . Frequency of Communication with Friends and Family:   . Frequency of Social Gatherings with Friends and Family:   . Attends Religious Services:   . Active Member of Clubs or Organizations:   . Attends Archivist Meetings:   Marland Kitchen Marital Status:     Outpatient Encounter Medications  as of 01/08/2020  Medication Sig  . alendronate (FOSAMAX) 70 MG tablet Take by mouth.  . Biotin 5 MG CAPS Take by mouth.  . calcitonin, salmon, (MIACALCIN/FORTICAL) 200 UNIT/ACT nasal spray 1 spray daily.  . calcium carbonate (TUMS - DOSED IN MG ELEMENTAL CALCIUM) 500 MG chewable tablet Chew by mouth.  . Calcium Carbonate-Simethicone 1000-60 MG CHEW Chew by mouth.  . Calcium-Phosphorus-Vitamin D (CITRACAL +D3 PO) Take 2 tablets by mouth daily.  . Glucosamine HCl-MSM (SM GLUCOSAMINE HCL-MSM) 750-750 MG TABS Take by mouth.  Marland Kitchen Lifitegrast (XIIDRA) 5 % SOLN   . LOTEMAX SM 0.38 % GEL Place 1 drop into the right eye 4 (four) times daily.  . Multiple Vitamins-Minerals (CENTRUM SILVER 50+WOMEN) TABS Take  1 tablet by mouth daily.  . mupirocin ointment (BACTROBAN) 2 % APPLY TO AFFECTED AREAS TWICE DAILY  . ofloxacin (OCUFLOX) 0.3 % ophthalmic solution Place 1 drop into the right eye 4 (four) times daily.  . Omega-3 Fatty Acids (FISH OIL PO) Take by mouth.  . Probiotic Product (PROBIOTIC-10) CAPS Take by mouth.  . rosuvastatin (CRESTOR) 10 MG tablet Take 1 tablet (10 mg total) by mouth daily.  . [DISCONTINUED] levothyroxine (SYNTHROID) 75 MCG tablet TAKE 1 TABLET EVERY DAY ON EMPTY STOMACHWITH A GLASS OF WATER AT LEAST 30-60 MINBEFORE BREAKFAST   No facility-administered encounter medications on file as of 01/08/2020.   Review of Systems  Constitutional: Negative for appetite change and unexpected weight change.  HENT: Negative for congestion and sinus pressure.   Respiratory: Negative for cough, chest tightness and shortness of breath.   Cardiovascular: Negative for palpitations and leg swelling.       Reproducible chest pain as outlined.   Gastrointestinal: Negative for abdominal pain, diarrhea, nausea and vomiting.  Genitourinary: Negative for difficulty urinating and dysuria.  Musculoskeletal: Negative for myalgias.       Left knee pain and swelling as outlined.   Skin: Negative for color change and rash.  Neurological: Negative for dizziness, light-headedness and headaches.  Psychiatric/Behavioral: Negative for agitation and dysphoric mood.       Objective:    Physical Exam Constitutional:      General: She is not in acute distress.    Appearance: Normal appearance.  HENT:     Head: Normocephalic and atraumatic.     Right Ear: External ear normal.     Left Ear: External ear normal.  Eyes:     General: No scleral icterus.       Right eye: No discharge.        Left eye: No discharge.     Conjunctiva/sclera: Conjunctivae normal.  Neck:     Thyroid: No thyromegaly.  Cardiovascular:     Rate and Rhythm: Normal rate and regular rhythm.  Pulmonary:     Effort: No respiratory  distress.     Breath sounds: Normal breath sounds. No wheezing.  Abdominal:     General: Bowel sounds are normal.     Palpations: Abdomen is soft.     Tenderness: There is no abdominal tenderness.  Musculoskeletal:        General: No swelling or tenderness.     Cervical back: Neck supple. No tenderness.  Lymphadenopathy:     Cervical: No cervical adenopathy.  Skin:    Findings: No erythema or rash.  Neurological:     Mental Status: She is alert.  Psychiatric:        Mood and Affect: Mood normal.  Behavior: Behavior normal.     BP 128/76   Pulse 90   Temp 97.8 F (36.6 C)   Resp 16   Ht _0  (1.626 m)   Wt 123 lb 6.4 oz (56 kg)   SpO2 98%   BMI 21.18 kg/m  Wt Readings from Last 3 Encounters:  01/08/20 123 lb 6.4 oz (56 kg)  11/15/19 122 lb (55.3 kg)  09/12/19 122 lb 9.6 oz (55.6 kg)     Lab Results  Component Value Date   WBC 10.6 (H) 01/08/2020   HGB 13.3 01/08/2020   HCT 38.5 01/08/2020   PLT 267.0 01/08/2020   GLUCOSE 93 01/08/2020   CHOL 136 01/08/2020   TRIG 81.0 01/08/2020   HDL 73.40 01/08/2020   LDLCALC 46 01/08/2020   ALT 26 01/08/2020   AST 26 01/08/2020   NA 138 01/08/2020   K 3.7 01/08/2020   CL 101 01/08/2020   CREATININE 0.67 01/08/2020   BUN 18 01/08/2020   CO2 29 01/08/2020   TSH 5.01 (H) 01/08/2020   INR 0.8 02/29/2012    MR KNEE LEFT WO CONTRAST  Result Date: 11/30/2019 CLINICAL DATA:  Left knee pain and swelling. EXAM: MRI OF THE LEFT KNEE WITHOUT CONTRAST TECHNIQUE: Multiplanar, multisequence MR imaging of the knee was performed. No intravenous contrast was administered. COMPARISON:  None. FINDINGS: MENISCI Medial meniscus: There is what appears to be an incomplete radial tear of the root of the posterior horn. There is degeneration of the posterior horn and midbody with peripheral extrusion of the meniscus due to medial joint space narrowing. Lateral meniscus:  Normal. LIGAMENTS Cruciates:  Intact. Collaterals: Intact. Edema  deep and superficial to the MCL is felt to be due to the medial compartment disease rather than injury to the MCL. CARTILAGE Patellofemoral: Full-thickness cartilage loss of the anterior superior aspect of the medial aspect of the trochlear groove of the distal femur with subcortical edema. Diffuse thinning of the articular cartilage on the medial facet patella. Medial: Diffuse full-thickness cartilage loss of the central portion of the femoral condyle and diffuse marked thinning of the articular cartilage of the medial tibial plateau with subcortical edema in the condyle and tibial plateau. Lateral:  Normal. Joint: Moderate joint effusion. Synovial hypertrophy and debris in the joint. Normal Hoffa's fat pad. No plical thickening. Popliteal Fossa: No Baker's cyst. Intact popliteus tendon. There is edema in the popliteus muscle and adjacent to the pes anserine tendons at the posteromedial corner of the knee, all likely due to the joint effusion. Extensor Mechanism:  Normal. Bones: Small tricompartmental marginal osteophytes, most prominent in the medial compartment. Subcortical edema in the medial compartment. Other: There is a small amount of fluid in the prepatellar bursa adjacent to the upper pole of the patella. IMPRESSION: 1. 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 2. Moderate joint effusion with synovial hypertrophy and debris in the joint. 3. Edema in the popliteus muscle and adjacent to the pes anserine tendons at the posteromedial corner of the knee, likely due to the medial compartment disease rather than injury to the MCL. 4. 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 5. Mild prepatellar bursitis. Electronically Signed   By: Lorriane Shire M.D.   On: 11/30/2019 14:00       Assessment & Plan:   Problem List Items Addressed This Visit    Acquired hypothyroidism    On thyroid  replacement.  Follow tsh.       Diarrhea    Has seen GI.  Diagnosed with IBSD.  Taking benefiber.  No diarrhea.  Follow.        Hair loss    Check routine labs including cbc, met c and tsh.  Discussed referral to dermatology.       Relevant Orders   CBC with Differential/Platelet (Completed)   Comprehensive metabolic panel (Completed)   TSH (Completed)   Hypercholesteremia    Low cholesterol diet and exercise.  Check lipid panel.       Relevant Orders   Lipid panel (Completed)   Pre-op examination    Planning for left knee surgery.  Chest pain is reproducible on exam.  No chest pain or tightness with increased activity or exertion.  EKG - SR with no acute ischemic changes.  I feel she is at low risk to proceed with planned surgery.  She will need close intra op and post op monitoring of her heart rate and blood pressure to avoid extremes.        Serrated adenoma of colon    Had colonoscopy 2017 - low grade cytologic dysplasia.  Recommended f/u colonoscopy in 3 years.  Had f/u colonoscopy 06/2019 - few polyps removed.        Other Visit Diagnoses    Chest pain, unspecified type    -  Primary   Relevant Orders   EKG 12-Lead     I spent 45 minutes with the patient and more than 50% of the time was spent in consultation regarding the above.  Time spent discussing her knee pain and plans for upcoming surgery.  Time also spent discussing her other medical concerns and plans for treatment as outlined.     Einar Pheasant, MD

## 2020-01-09 ENCOUNTER — Other Ambulatory Visit: Payer: Self-pay | Admitting: Internal Medicine

## 2020-01-09 DIAGNOSIS — D72829 Elevated white blood cell count, unspecified: Secondary | ICD-10-CM

## 2020-01-09 NOTE — Progress Notes (Signed)
Order placed for f/u cbc.   

## 2020-01-10 ENCOUNTER — Other Ambulatory Visit: Payer: Self-pay

## 2020-01-10 ENCOUNTER — Ambulatory Visit: Payer: PPO | Admitting: Internal Medicine

## 2020-01-10 MED ORDER — LEVOTHYROXINE SODIUM 88 MCG PO TABS: 88.0000 ug | ORAL_TABLET | Freq: Every day | ORAL | 1 refills | Status: DC

## 2020-01-16 ENCOUNTER — Encounter: Payer: Self-pay | Admitting: Internal Medicine

## 2020-01-16 NOTE — Assessment & Plan Note (Signed)
Had colonoscopy 2017 - low grade cytologic dysplasia.  Recommended f/u colonoscopy in 3 years.  Had f/u colonoscopy 06/2019 - few polyps removed.

## 2020-01-16 NOTE — Assessment & Plan Note (Signed)
On thyroid replacement.  Follow tsh.  

## 2020-01-16 NOTE — Assessment & Plan Note (Signed)
Low cholesterol diet and exercise.  Check lipid panel.   

## 2020-01-16 NOTE — Assessment & Plan Note (Signed)
Check routine labs including cbc, met c and tsh.  Discussed referral to dermatology.

## 2020-01-16 NOTE — Assessment & Plan Note (Signed)
Has seen GI.  Diagnosed with IBSD.  Taking benefiber.  No diarrhea.  Follow.

## 2020-01-16 NOTE — Assessment & Plan Note (Signed)
Planning for left knee surgery.  Chest pain is reproducible on exam.  No chest pain or tightness with increased activity or exertion.  EKG - SR with no acute ischemic changes.  I feel she is at low risk to proceed with planned surgery.  She will need close intra op and post op monitoring of her heart rate and blood pressure to avoid extremes.

## 2020-01-22 ENCOUNTER — Other Ambulatory Visit: Payer: Self-pay | Admitting: Internal Medicine

## 2020-01-23 ENCOUNTER — Other Ambulatory Visit (INDEPENDENT_AMBULATORY_CARE_PROVIDER_SITE_OTHER): Payer: PPO

## 2020-01-23 ENCOUNTER — Other Ambulatory Visit: Payer: Self-pay

## 2020-01-23 DIAGNOSIS — D72829 Elevated white blood cell count, unspecified: Secondary | ICD-10-CM | POA: Diagnosis not present

## 2020-01-23 DIAGNOSIS — Z01818 Encounter for other preprocedural examination: Secondary | ICD-10-CM | POA: Diagnosis not present

## 2020-01-23 DIAGNOSIS — Z20822 Contact with and (suspected) exposure to covid-19: Secondary | ICD-10-CM | POA: Diagnosis not present

## 2020-01-23 LAB — CBC WITH DIFFERENTIAL/PLATELET
Basophils Absolute: 0.1 10*3/uL (ref 0.0–0.1)
Basophils Relative: 0.7 % (ref 0.0–3.0)
Eosinophils Absolute: 0.1 10*3/uL (ref 0.0–0.7)
Eosinophils Relative: 1.3 % (ref 0.0–5.0)
HCT: 37 % (ref 36.0–46.0)
Hemoglobin: 12.5 g/dL (ref 12.0–15.0)
Lymphocytes Relative: 16.4 % (ref 12.0–46.0)
Lymphs Abs: 1.3 10*3/uL (ref 0.7–4.0)
MCHC: 33.8 g/dL (ref 30.0–36.0)
MCV: 93.1 fl (ref 78.0–100.0)
Monocytes Absolute: 0.8 10*3/uL (ref 0.1–1.0)
Monocytes Relative: 10.1 % (ref 3.0–12.0)
Neutro Abs: 5.8 10*3/uL (ref 1.4–7.7)
Neutrophils Relative %: 71.5 % (ref 43.0–77.0)
Platelets: 268 10*3/uL (ref 150.0–400.0)
RBC: 3.98 Mil/uL (ref 3.87–5.11)
RDW: 14.4 % (ref 11.5–15.5)
WBC: 8.2 10*3/uL (ref 4.0–10.5)

## 2020-01-24 ENCOUNTER — Encounter: Payer: Self-pay | Admitting: Internal Medicine

## 2020-01-24 DIAGNOSIS — Z79899 Other long term (current) drug therapy: Secondary | ICD-10-CM | POA: Diagnosis not present

## 2020-01-24 DIAGNOSIS — M1712 Unilateral primary osteoarthritis, left knee: Secondary | ICD-10-CM | POA: Diagnosis not present

## 2020-01-24 DIAGNOSIS — E041 Nontoxic single thyroid nodule: Secondary | ICD-10-CM | POA: Diagnosis not present

## 2020-01-24 DIAGNOSIS — Z885 Allergy status to narcotic agent status: Secondary | ICD-10-CM | POA: Diagnosis not present

## 2020-01-24 DIAGNOSIS — K862 Cyst of pancreas: Secondary | ICD-10-CM | POA: Diagnosis not present

## 2020-01-24 DIAGNOSIS — E039 Hypothyroidism, unspecified: Secondary | ICD-10-CM | POA: Diagnosis not present

## 2020-01-24 DIAGNOSIS — M8580 Other specified disorders of bone density and structure, unspecified site: Secondary | ICD-10-CM | POA: Diagnosis not present

## 2020-01-30 DIAGNOSIS — G8929 Other chronic pain: Secondary | ICD-10-CM | POA: Diagnosis not present

## 2020-01-30 DIAGNOSIS — M6281 Muscle weakness (generalized): Secondary | ICD-10-CM | POA: Diagnosis not present

## 2020-01-30 DIAGNOSIS — M25662 Stiffness of left knee, not elsewhere classified: Secondary | ICD-10-CM | POA: Diagnosis not present

## 2020-01-30 DIAGNOSIS — M25562 Pain in left knee: Secondary | ICD-10-CM | POA: Diagnosis not present

## 2020-01-30 DIAGNOSIS — Z96652 Presence of left artificial knee joint: Secondary | ICD-10-CM | POA: Diagnosis not present

## 2020-02-02 ENCOUNTER — Ambulatory Visit (INDEPENDENT_AMBULATORY_CARE_PROVIDER_SITE_OTHER): Payer: PPO | Admitting: Podiatry

## 2020-02-02 ENCOUNTER — Other Ambulatory Visit: Payer: Self-pay

## 2020-02-02 DIAGNOSIS — L603 Nail dystrophy: Secondary | ICD-10-CM

## 2020-02-02 DIAGNOSIS — B351 Tinea unguium: Secondary | ICD-10-CM

## 2020-02-02 DIAGNOSIS — M25562 Pain in left knee: Secondary | ICD-10-CM | POA: Diagnosis not present

## 2020-02-02 DIAGNOSIS — Z96652 Presence of left artificial knee joint: Secondary | ICD-10-CM | POA: Diagnosis not present

## 2020-02-02 NOTE — Progress Notes (Signed)
Patient presents today for the 2nd laser treatment. Diagnosed with mycotic nail infection by Dr. Milinda Pointer.   Toenail most affected the hallux right.  All other systems are negative.  Nails were filed thin. Laser therapy was administered to 1st toenails right and patient tolerated the treatment well. All safety precautions were in place.    Follow up in 4 weeks for laser # 3.

## 2020-02-05 DIAGNOSIS — Z96652 Presence of left artificial knee joint: Secondary | ICD-10-CM | POA: Diagnosis not present

## 2020-02-05 DIAGNOSIS — M25562 Pain in left knee: Secondary | ICD-10-CM | POA: Diagnosis not present

## 2020-02-09 DIAGNOSIS — M25562 Pain in left knee: Secondary | ICD-10-CM | POA: Diagnosis not present

## 2020-02-09 DIAGNOSIS — Z96652 Presence of left artificial knee joint: Secondary | ICD-10-CM | POA: Diagnosis not present

## 2020-02-12 DIAGNOSIS — M25562 Pain in left knee: Secondary | ICD-10-CM | POA: Diagnosis not present

## 2020-02-12 DIAGNOSIS — Z96652 Presence of left artificial knee joint: Secondary | ICD-10-CM | POA: Diagnosis not present

## 2020-02-15 DIAGNOSIS — M25562 Pain in left knee: Secondary | ICD-10-CM | POA: Diagnosis not present

## 2020-02-15 DIAGNOSIS — Z96652 Presence of left artificial knee joint: Secondary | ICD-10-CM | POA: Diagnosis not present

## 2020-02-19 DIAGNOSIS — M25562 Pain in left knee: Secondary | ICD-10-CM | POA: Diagnosis not present

## 2020-02-19 DIAGNOSIS — Z96652 Presence of left artificial knee joint: Secondary | ICD-10-CM | POA: Diagnosis not present

## 2020-02-23 DIAGNOSIS — M25562 Pain in left knee: Secondary | ICD-10-CM | POA: Diagnosis not present

## 2020-02-23 DIAGNOSIS — Z96652 Presence of left artificial knee joint: Secondary | ICD-10-CM | POA: Diagnosis not present

## 2020-02-26 DIAGNOSIS — M25562 Pain in left knee: Secondary | ICD-10-CM | POA: Diagnosis not present

## 2020-02-26 DIAGNOSIS — Z96652 Presence of left artificial knee joint: Secondary | ICD-10-CM | POA: Diagnosis not present

## 2020-02-29 DIAGNOSIS — Z96652 Presence of left artificial knee joint: Secondary | ICD-10-CM | POA: Diagnosis not present

## 2020-02-29 DIAGNOSIS — M25562 Pain in left knee: Secondary | ICD-10-CM | POA: Diagnosis not present

## 2020-03-01 ENCOUNTER — Ambulatory Visit (INDEPENDENT_AMBULATORY_CARE_PROVIDER_SITE_OTHER): Payer: PPO | Admitting: *Deleted

## 2020-03-01 ENCOUNTER — Other Ambulatory Visit: Payer: Self-pay

## 2020-03-01 DIAGNOSIS — L603 Nail dystrophy: Secondary | ICD-10-CM

## 2020-03-01 DIAGNOSIS — B351 Tinea unguium: Secondary | ICD-10-CM

## 2020-03-01 NOTE — Progress Notes (Signed)
Patient presents today for the 3rd laser treatment. Diagnosed with mycotic nail infection by Dr. Milinda Pointer.   Toenail most affected the hallux right. Her nail is looking much better.  All other systems are negative.  Nails were filed thin. Laser therapy was administered to 1st toenails right and patient tolerated the treatment well. All safety precautions were in place.    Follow up in 4 weeks for laser # 4.

## 2020-03-05 DIAGNOSIS — Z96652 Presence of left artificial knee joint: Secondary | ICD-10-CM | POA: Diagnosis not present

## 2020-03-05 DIAGNOSIS — M25562 Pain in left knee: Secondary | ICD-10-CM | POA: Diagnosis not present

## 2020-03-12 ENCOUNTER — Ambulatory Visit: Payer: PPO | Admitting: Internal Medicine

## 2020-03-13 DIAGNOSIS — M25562 Pain in left knee: Secondary | ICD-10-CM | POA: Diagnosis not present

## 2020-03-13 DIAGNOSIS — Z96652 Presence of left artificial knee joint: Secondary | ICD-10-CM | POA: Diagnosis not present

## 2020-03-15 DIAGNOSIS — M2392 Unspecified internal derangement of left knee: Secondary | ICD-10-CM | POA: Diagnosis not present

## 2020-03-15 DIAGNOSIS — Z96652 Presence of left artificial knee joint: Secondary | ICD-10-CM | POA: Diagnosis not present

## 2020-03-19 ENCOUNTER — Encounter: Payer: Self-pay | Admitting: Plastic Surgery

## 2020-03-19 ENCOUNTER — Other Ambulatory Visit: Payer: Self-pay

## 2020-03-19 ENCOUNTER — Ambulatory Visit (INDEPENDENT_AMBULATORY_CARE_PROVIDER_SITE_OTHER): Payer: Self-pay | Admitting: Plastic Surgery

## 2020-03-19 VITALS — BP 126/78 | HR 88 | Temp 97.5°F

## 2020-03-19 DIAGNOSIS — Z719 Counseling, unspecified: Secondary | ICD-10-CM

## 2020-03-19 NOTE — Progress Notes (Signed)
Botulinum Toxin Injection Procedure Note  Procedure: Cosmetic botulinum toxin  Pre-operative Diagnosis: Dynamic rhytides   Post-operative Diagnosis: Same  Complications:  None  Brief history: The patient desires botulinum toxin injection of her forehead. I discussed with the patient this proposed procedure of botulinum toxin injections, which is customized depending on the particular needs of the patient. It is performed on facial rhytids as a temporary correction. The alternatives were discussed with the patient. The risks were addressed including bleeding, scarring, infection, damage to deeper structures, asymmetry, and chronic pain, which may occur infrequently after a procedure. The individual's choice to undergo a surgical procedure is based on the comparison of risks to potential benefits. Other risks include unsatisfactory results, brow ptosis, eyelid ptosis, allergic reaction, temporary paralysis, which should go away with time, bruising, blurring disturbances and delayed healing. Botulinum toxin injections do not arrest the aging process or produce permanent tightening of the eyelid.  Operative intervention maybe necessary to maintain the results of a blepharoplasty or botulinum toxin. The patient understands and wishes to proceed.  Procedure: The area was prepped with alcohol and dried with a clean gauze. Using a clean technique, the botulinum toxin was diluted with 1.25 cc of preservative-free normal saline which was slowly injected with an 18 gauge needle in a tuberculin syringes.  A 32 gauge needles were then used to inject the botulinum toxin. This mixture allow for an aliquot of 5 units per 0.1 cc in each injection site.    Subsequently the mixture was injected in the glabellar and forehead area with preservation of the temporal branch to the lateral eyebrow as well as into each lateral canthal area beginning from the lateral orbital rim medial to the zygomaticus major in 3 separate  areas. A total of 30 Units of botulinum toxin was used. The forehead and glabellar area was injected with care to inject intramuscular only while holding pressure on the supratrochlear vessels in each area during each injection on either side of the medial corrugators. The injection proceeded vertically superiorly to the medial 2/3 of the frontalis muscle and superior 2/3 of the lateral frontalis, again with preservation of the frontal branch.  No complications were noted. Light pressure was held for 5 minutes. She was instructed explicitly in post-operative care.  Botox LOT:  [id number] C4 EXP:  9/23

## 2020-03-20 DIAGNOSIS — M25562 Pain in left knee: Secondary | ICD-10-CM | POA: Diagnosis not present

## 2020-03-20 DIAGNOSIS — Z96652 Presence of left artificial knee joint: Secondary | ICD-10-CM | POA: Diagnosis not present

## 2020-03-22 DIAGNOSIS — M25562 Pain in left knee: Secondary | ICD-10-CM | POA: Diagnosis not present

## 2020-03-22 DIAGNOSIS — Z96652 Presence of left artificial knee joint: Secondary | ICD-10-CM | POA: Diagnosis not present

## 2020-03-25 DIAGNOSIS — Z96652 Presence of left artificial knee joint: Secondary | ICD-10-CM | POA: Diagnosis not present

## 2020-03-25 DIAGNOSIS — M25562 Pain in left knee: Secondary | ICD-10-CM | POA: Diagnosis not present

## 2020-03-26 ENCOUNTER — Ambulatory Visit: Payer: PPO | Admitting: Internal Medicine

## 2020-03-28 DIAGNOSIS — Z96652 Presence of left artificial knee joint: Secondary | ICD-10-CM | POA: Diagnosis not present

## 2020-03-28 DIAGNOSIS — M25562 Pain in left knee: Secondary | ICD-10-CM | POA: Diagnosis not present

## 2020-03-29 ENCOUNTER — Ambulatory Visit (INDEPENDENT_AMBULATORY_CARE_PROVIDER_SITE_OTHER): Payer: PPO | Admitting: Podiatry

## 2020-03-29 ENCOUNTER — Ambulatory Visit (INDEPENDENT_AMBULATORY_CARE_PROVIDER_SITE_OTHER): Payer: PPO

## 2020-03-29 ENCOUNTER — Other Ambulatory Visit: Payer: Self-pay

## 2020-03-29 DIAGNOSIS — M7752 Other enthesopathy of left foot: Secondary | ICD-10-CM

## 2020-03-29 DIAGNOSIS — H1045 Other chronic allergic conjunctivitis: Secondary | ICD-10-CM | POA: Diagnosis not present

## 2020-03-29 DIAGNOSIS — H40013 Open angle with borderline findings, low risk, bilateral: Secondary | ICD-10-CM | POA: Diagnosis not present

## 2020-03-29 DIAGNOSIS — H11441 Conjunctival cysts, right eye: Secondary | ICD-10-CM | POA: Diagnosis not present

## 2020-03-29 DIAGNOSIS — L603 Nail dystrophy: Secondary | ICD-10-CM

## 2020-03-29 DIAGNOSIS — B351 Tinea unguium: Secondary | ICD-10-CM

## 2020-03-29 DIAGNOSIS — H16223 Keratoconjunctivitis sicca, not specified as Sjogren's, bilateral: Secondary | ICD-10-CM | POA: Diagnosis not present

## 2020-03-29 DIAGNOSIS — H2513 Age-related nuclear cataract, bilateral: Secondary | ICD-10-CM | POA: Diagnosis not present

## 2020-03-29 DIAGNOSIS — H11822 Conjunctivochalasis, left eye: Secondary | ICD-10-CM | POA: Diagnosis not present

## 2020-03-29 DIAGNOSIS — H43811 Vitreous degeneration, right eye: Secondary | ICD-10-CM | POA: Diagnosis not present

## 2020-03-29 DIAGNOSIS — H40033 Anatomical narrow angle, bilateral: Secondary | ICD-10-CM | POA: Diagnosis not present

## 2020-03-29 NOTE — Progress Notes (Signed)
Patient presents today for the 4th laser treatment. Diagnosed with mycotic nail infection by Dr. Milinda Pointer.   Toenail most affected the hallux right. Her nail is much improved.  All other systems are negative.  Nails were filed thin. Laser therapy was administered to 1st toenails right and patient tolerated the treatment well. All safety precautions were in place.    Follow up in 4 weeks for laser # 5.  ~Patient states her left lateral ankle is very sore and swollen and has been for about a week. She does not relate any injury. She has tried rest and ice with no relief. X-rays were taken and Dr. Posey Pronto will evaluate her on Tuesday in [place] since she has another appointment to get to today.

## 2020-04-01 ENCOUNTER — Encounter: Payer: Self-pay | Admitting: Internal Medicine

## 2020-04-01 ENCOUNTER — Ambulatory Visit (INDEPENDENT_AMBULATORY_CARE_PROVIDER_SITE_OTHER): Payer: PPO | Admitting: Internal Medicine

## 2020-04-01 ENCOUNTER — Other Ambulatory Visit: Payer: Self-pay

## 2020-04-01 VITALS — BP 118/74 | HR 86 | Temp 97.5°F | Resp 16 | Ht 64.0 in | Wt 120.8 lb

## 2020-04-01 DIAGNOSIS — E2839 Other primary ovarian failure: Secondary | ICD-10-CM

## 2020-04-01 DIAGNOSIS — F419 Anxiety disorder, unspecified: Secondary | ICD-10-CM

## 2020-04-01 DIAGNOSIS — E039 Hypothyroidism, unspecified: Secondary | ICD-10-CM

## 2020-04-01 DIAGNOSIS — M25572 Pain in left ankle and joints of left foot: Secondary | ICD-10-CM

## 2020-04-01 DIAGNOSIS — Z1231 Encounter for screening mammogram for malignant neoplasm of breast: Secondary | ICD-10-CM

## 2020-04-01 DIAGNOSIS — E7800 Pure hypercholesterolemia, unspecified: Secondary | ICD-10-CM | POA: Diagnosis not present

## 2020-04-01 NOTE — Progress Notes (Signed)
Patient ID: Sherry Bernard, female   DOB: [birth year], 79 y.o.   MRN: [id number]   Subjective:    Patient ID: Sherry Bernard, female    DOB: May 19, 1941, 79 y.o.   MRN: [id number]  HPI This visit occurred during the SARS-CoV-2 public health emergency.  Safety protocols were in place, including screening questions prior to the visit, additional usage of staff PPE, and extensive cleaning of exam room while observing appropriate contact time as indicated for disinfecting solutions.  Patient here for a scheduled follow up.  She reports she is doing relatively well.  Is s/p left partial knee replacement.  Going to therapy.  Started having pain left lateral foot/ankle - swelling.  Has been elevating and icing.  Due to see podiatry tomorrow.  Had xrays.  Waiting for results.  Tries to stay active.  No chest pain or sob reported.  No abdominal pain or bowel change reported.  Handling stress.    Past Medical History:  Diagnosis Date   Colon polyp    Hyperthyroidism    Past Surgical History:  Procedure Laterality Date   APPENDECTOMY     child   COLONOSCOPY WITH PROPOFOL N/A 06/26/2019   Procedure: COLONOSCOPY WITH PROPOFOL;  Surgeon: Lollie Sails, MD;  Location: [facility] ENDOSCOPY;  Service: Endoscopy;  Laterality: N/A;   TONSILECTOMY, ADENOIDECTOMY, BILATERAL MYRINGOTOMY AND TUBES     child   Family History  Problem Relation Age of Onset   Breast cancer Mother    Social History   Socioeconomic History   Marital status: Married    Spouse name: Not on file   Number of children: 3   Years of education: Not on file   Highest education level: Not on file  Occupational History   Occupation: retired  Tobacco Use   Smoking status: Never Smoker   Smokeless tobacco: Never Used  Scientific laboratory technician Use: Never used  Substance and Sexual Activity   Alcohol use: Yes    Alcohol/week: 2.0 - 3.0 standard drinks    Types: 2 - 3 Glasses of wine per week    Comment: socially   Drug  use: No   Sexual activity: Yes  Other Topics Concern   Not on file  Social History Narrative   Not on file   Social Determinants of Health   Financial Resource Strain:    Difficulty of Paying Living Expenses:   Food Insecurity:    Worried About Charity fundraiser in the Last Year:    Arboriculturist in the Last Year:   Transportation Needs:    Film/video editor (Medical):    Lack of Transportation (Non-Medical):   Physical Activity:    Days of Exercise per Week:    Minutes of Exercise per Session:   Stress:    Feeling of Stress :   Social Connections:    Frequency of Communication with Friends and Family:    Frequency of Social Gatherings with Friends and Family:    Attends Religious Services:    Active Member of Clubs or Organizations:    Attends Archivist Meetings:    Marital Status:     Outpatient Encounter Medications as of 04/01/2020  Medication Sig   aspirin EC 81 MG tablet Take 81 mg by mouth Bernard. Swallow whole.   BIOTIN PO Take by mouth.    Calcium Carbonate-Simethicone 1000-60 MG CHEW Chew by mouth.   Calcium-Phosphorus-Vitamin D (CITRACAL +D3 PO) Take 2 tablets by mouth  Bernard.   levothyroxine (SYNTHROID) 88 MCG tablet Take 1 tablet (88 mcg total) by mouth Bernard.   Lifitegrast (XIIDRA) 5 % SOLN    LOTEMAX SM 0.38 % GEL Place 1 drop into the right eye 4 (four) times Bernard.   meloxicam (MOBIC) 7.5 MG tablet Take 7.5 mg by mouth Bernard.   Multiple Vitamins-Minerals (CENTRUM SILVER 50+WOMEN) TABS Take 1 tablet by mouth Bernard.   ofloxacin (OCUFLOX) 0.3 % ophthalmic solution Place 1 drop into the right eye 4 (four) times Bernard.   Omega-3 Fatty Acids (FISH OIL PO) Take by mouth.   rosuvastatin (CRESTOR) 10 MG tablet TAKE 1 TABLET BY MOUTH Bernard   [DISCONTINUED] calcitonin, salmon, (MIACALCIN/FORTICAL) 200 UNIT/ACT nasal spray 1 spray Bernard.   [DISCONTINUED] calcium carbonate (TUMS - DOSED IN MG ELEMENTAL CALCIUM) 500 MG  chewable tablet Chew by mouth.   [DISCONTINUED] Glucosamine HCl-MSM (SM GLUCOSAMINE HCL-MSM) 750-750 MG TABS Take by mouth.   [DISCONTINUED] mupirocin ointment (BACTROBAN) 2 % APPLY TO AFFECTED AREAS TWICE Bernard   [DISCONTINUED] Probiotic Product (PROBIOTIC-10) CAPS Take by mouth.   No facility-administered encounter medications on file as of 04/01/2020.    Review of Systems  Constitutional: Negative for appetite change and unexpected weight change.  HENT: Negative for congestion and sinus pressure.   Respiratory: Negative for cough, chest tightness and shortness of breath.   Cardiovascular: Negative for chest pain and palpitations.  Gastrointestinal: Negative for abdominal pain, diarrhea, nausea and vomiting.  Genitourinary: Negative for difficulty urinating and dysuria.  Musculoskeletal: Negative for myalgias.       Left foot/ankle pain.    Skin: Negative for color change and rash.  Neurological: Negative for dizziness, light-headedness and headaches.  Psychiatric/Behavioral: Negative for agitation and dysphoric mood.       Objective:    Physical Exam Constitutional:      General: She is not in acute distress.    Appearance: Normal appearance.  HENT:     Head: Normocephalic and atraumatic.     Right Ear: External ear normal.     Left Ear: External ear normal.  Eyes:     General: No scleral icterus.       Right eye: No discharge.        Left eye: No discharge.     Conjunctiva/sclera: Conjunctivae normal.  Neck:     Thyroid: No thyromegaly.  Cardiovascular:     Rate and Rhythm: Normal rate and regular rhythm.  Pulmonary:     Effort: No respiratory distress.     Breath sounds: Normal breath sounds. No wheezing.  Abdominal:     General: Bowel sounds are normal.     Palpations: Abdomen is soft.     Tenderness: There is no abdominal tenderness.  Musculoskeletal:        General: No swelling or tenderness.     Cervical back: Neck supple. No tenderness.    Lymphadenopathy:     Cervical: No cervical adenopathy.  Skin:    Findings: No erythema or rash.  Neurological:     Mental Status: She is alert.  Psychiatric:        Mood and Affect: Mood normal.        Behavior: Behavior normal.     BP 118/74    Pulse 86    Temp (!) 97.5 F (36.4 C)    Resp 16    Ht 5\' 4"  (1.626 m)    Wt 120 lb 12.8 oz (54.8 kg)    SpO2 98%  BMI 20.74 kg/m  Wt Readings from Last 3 Encounters:  04/01/20 120 lb 12.8 oz (54.8 kg)  01/08/20 123 lb 6.4 oz (56 kg)  11/15/19 122 lb (55.3 kg)     Lab Results  Component Value Date   WBC 8.2 01/23/2020   HGB 12.5 01/23/2020   HCT 37.0 01/23/2020   PLT 268.0 01/23/2020   GLUCOSE 93 01/08/2020   CHOL 136 01/08/2020   TRIG 81.0 01/08/2020   HDL 73.40 01/08/2020   LDLCALC 46 01/08/2020   ALT 26 01/08/2020   AST 26 01/08/2020   NA 138 01/08/2020   K 3.7 01/08/2020   CL 101 01/08/2020   CREATININE 0.67 01/08/2020   BUN 18 01/08/2020   CO2 29 01/08/2020   TSH 5.01 (H) 01/08/2020   INR 0.8 02/29/2012    MR KNEE LEFT WO CONTRAST  Result Date: 11/30/2019 CLINICAL DATA:  Left knee pain and swelling. EXAM: MRI OF THE LEFT KNEE WITHOUT CONTRAST TECHNIQUE: Multiplanar, multisequence MR imaging of the knee was performed. No intravenous contrast was administered. COMPARISON:  None. FINDINGS: MENISCI Medial meniscus: There is what appears to be an incomplete radial tear of the root of the posterior horn. There is degeneration of the posterior horn and midbody with peripheral extrusion of the meniscus due to medial joint space narrowing. Lateral meniscus:  Normal. LIGAMENTS Cruciates:  Intact. Collaterals: Intact. Edema deep and superficial to the MCL is felt to be due to the medial compartment disease rather than injury to the MCL. CARTILAGE Patellofemoral: Full-thickness cartilage loss of the anterior superior aspect of the medial aspect of the trochlear groove of the distal femur with subcortical edema. Diffuse thinning of  the articular cartilage on the medial facet patella. Medial: Diffuse full-thickness cartilage loss of the central portion of the femoral condyle and diffuse marked thinning of the articular cartilage of the medial tibial plateau with subcortical edema in the condyle and tibial plateau. Lateral:  Normal. Joint: Moderate joint effusion. Synovial hypertrophy and debris in the joint. Normal Hoffa's fat pad. No plical thickening. Popliteal Fossa: No Baker's cyst. Intact popliteus tendon. There is edema in the popliteus muscle and adjacent to the pes anserine tendons at the posteromedial corner of the knee, all likely due to the joint effusion. Extensor Mechanism:  Normal. Bones: Small tricompartmental marginal osteophytes, most prominent in the medial compartment. Subcortical edema in the medial compartment. Other: There is a small amount of fluid in the prepatellar bursa adjacent to the upper pole of the patella. IMPRESSION: 1. 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 2. Moderate joint effusion with synovial hypertrophy and debris in the joint. 3. Edema in the popliteus muscle and adjacent to the pes anserine tendons at the posteromedial corner of the knee, likely due to the medial compartment disease rather than injury to the MCL. 4. 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 5. Mild prepatellar bursitis. Electronically Signed   By: Lorriane Shire M.D.   On: 11/30/2019 14:00       Assessment & Plan:   Problem List Items Addressed This Visit    Acquired hypothyroidism    On thyroid replacement.  Follow tsh.        Anxiety    Stable.        Hypercholesteremia    Low cholesterol diet and exercise.  Follow lipid panel.       Relevant Medications   aspirin EC  81 MG tablet   Left ankle pain    Pain - persistent.  Had xrays.  Planning for f/u with podiatry tomorrow.           Other Visit Diagnoses    Estrogen deficiency    -  Primary   Relevant Orders   DG Bone Density   Encounter for screening mammogram for malignant neoplasm of breast       Relevant Orders   MM 3D SCREEN BREAST BILATERAL       Einar Pheasant, MD

## 2020-04-02 ENCOUNTER — Encounter: Payer: Self-pay | Admitting: Podiatry

## 2020-04-02 ENCOUNTER — Ambulatory Visit: Payer: PPO | Admitting: Podiatry

## 2020-04-02 ENCOUNTER — Other Ambulatory Visit: Payer: Self-pay

## 2020-04-02 DIAGNOSIS — S93402A Sprain of unspecified ligament of left ankle, initial encounter: Secondary | ICD-10-CM | POA: Diagnosis not present

## 2020-04-02 DIAGNOSIS — IMO0001 Reserved for inherently not codable concepts without codable children: Secondary | ICD-10-CM

## 2020-04-02 NOTE — Progress Notes (Signed)
Subjective:  Patient ID: Sherry Bernard, female    DOB: [birth year],  MRN: [id number]  Chief Complaint  Patient presents with  . Ankle Pain    pt is here for left ankle pain, pt states that the left ankle pain has been going on for about 2 months, pain is located at both medial to lateral side, pt states that the ankle is painful to the touch, and is often hard to walk on.    79 y.o. female presents with the above complaint.  Patient presents with new complaint of left ankle sprain that has been hurting her for the past 2 months.  Patient does not recall any inversion ankle injury however she did have 1 incident where she tripped and fell landing reporting her knee and end up getting a knee surgery.  Patient has tried icing it but has not helped much.  She has been able to ambulate but with a little bit of a limp.  She denies any other acute complaints.  She has not seen anyone else for this acute problem.  Her pain scale 6 out of 10.  And is dull achy in nature.   Review of Systems: Negative except as noted in the HPI. Denies N/V/F/Ch.  Past Medical History:  Diagnosis Date  . Colon polyp   . Hyperthyroidism     Current Outpatient Medications:  .  aspirin EC 81 MG tablet, Take 81 mg by mouth daily. Swallow whole., Disp: , Rfl:  .  BIOTIN PO, Take by mouth. , Disp: , Rfl:  .  Calcium Carbonate-Simethicone 1000-60 MG CHEW, Chew by mouth., Disp: , Rfl:  .  Calcium-Phosphorus-Vitamin D (CITRACAL +D3 PO), Take 2 tablets by mouth daily., Disp: , Rfl:  .  levothyroxine (SYNTHROID) 88 MCG tablet, Take 1 tablet (88 mcg total) by mouth daily., Disp: 90 tablet, Rfl: 1 .  Lifitegrast (XIIDRA) 5 % SOLN, , Disp: , Rfl:  .  LOTEMAX SM 0.38 % GEL, Place 1 drop into the right eye 4 (four) times daily., Disp: , Rfl:  .  meloxicam (MOBIC) 7.5 MG tablet, Take 7.5 mg by mouth daily., Disp: , Rfl:  .  Multiple Vitamins-Minerals (CENTRUM SILVER 50+WOMEN) TABS, Take 1 tablet by mouth daily., Disp: , Rfl:  .   ofloxacin (OCUFLOX) 0.3 % ophthalmic solution, Place 1 drop into the right eye 4 (four) times daily., Disp: , Rfl:  .  Omega-3 Fatty Acids (FISH OIL PO), Take by mouth., Disp: , Rfl:  .  rosuvastatin (CRESTOR) 10 MG tablet, TAKE 1 TABLET BY MOUTH DAILY, Disp: 90 tablet, Rfl: 1  Social History   Tobacco Use  Smoking Status Never Smoker  Smokeless Tobacco Never Used    No Known Allergies Objective:  There were no vitals filed for this visit. There is no height or weight on file to calculate BMI. Constitutional Well developed. Well nourished.  Vascular Dorsalis pedis pulses palpable bilaterally. Posterior tibial pulses palpable bilaterally. Capillary refill normal to all digits.  No cyanosis or clubbing noted. Pedal hair growth normal.  Neurologic Normal speech. Oriented to person, place, and time. Epicritic sensation to light touch grossly present bilaterally.  Dermatologic Nails well groomed and normal in appearance. No open wounds. No skin lesions.  Orthopedic:  Pain on palpation to the left ATFL.  There is 1+ pitting edema to the area.  Pain on plantarflexion inversion of the foot.  No pain with dorsiflexion eversion of the foot.  His findings were consistent with active and passive  range of motion.  No pain at the posterior tibial tendon, peroneal tendon, Achilles tendon.   Radiographs: 3 views of skeletally mature adult left ankle: No osseous abnormalities noted.  Mild anterior osteoarthritic changes noted.  No other bony deformities noted.  Good congruent ankle mortise noted. Assessment:   1. Grade 1 ankle sprain, left, initial encounter    Plan:  Patient was evaluated and treated and all questions answered.  Left ankle sprain grade 1 - I explained to the patient the etiology of ankle sprain various treatment options were extensively discussed.  Given that she is has no relief for last 2 months -I believe she will benefit from cam boot immobilization.  Cam boot was  dispensed. -I have educated her to take it easy for the next 4 weeks to allow the soft tissue to properly heal.  Patient states understanding.  No follow-ups on file.

## 2020-04-03 ENCOUNTER — Telehealth: Payer: Self-pay | Admitting: Internal Medicine

## 2020-04-03 NOTE — Telephone Encounter (Signed)
Pt called in wanted to let Dr.Scott know that she did go to [facility] .

## 2020-04-04 NOTE — Telephone Encounter (Signed)
Left detailed msg for pt

## 2020-04-04 NOTE — Telephone Encounter (Signed)
Thank her for the update.  I did receive a note from them.  Let us know if any problems.

## 2020-04-04 NOTE — Telephone Encounter (Signed)
Patient was seen at [facility] for a sprain. She just wanted you to be aware.

## 2020-04-07 ENCOUNTER — Encounter: Payer: Self-pay | Admitting: Internal Medicine

## 2020-04-07 DIAGNOSIS — M25572 Pain in left ankle and joints of left foot: Secondary | ICD-10-CM | POA: Insufficient documentation

## 2020-04-07 NOTE — Assessment & Plan Note (Signed)
Pain - persistent.  Had xrays.  Planning for f/u with podiatry tomorrow.

## 2020-04-07 NOTE — Assessment & Plan Note (Signed)
Stable

## 2020-04-07 NOTE — Assessment & Plan Note (Signed)
Low cholesterol diet and exercise.  Follow lipid panel.   

## 2020-04-07 NOTE — Assessment & Plan Note (Signed)
On thyroid replacement.  Follow tsh.  

## 2020-04-15 ENCOUNTER — Other Ambulatory Visit: Payer: Self-pay | Admitting: Internal Medicine

## 2020-04-15 ENCOUNTER — Other Ambulatory Visit: Payer: Self-pay | Admitting: Family Medicine

## 2020-04-16 DIAGNOSIS — D225 Melanocytic nevi of trunk: Secondary | ICD-10-CM | POA: Diagnosis not present

## 2020-04-16 DIAGNOSIS — C4441 Basal cell carcinoma of skin of scalp and neck: Secondary | ICD-10-CM | POA: Diagnosis not present

## 2020-04-16 DIAGNOSIS — D2262 Melanocytic nevi of left upper limb, including shoulder: Secondary | ICD-10-CM | POA: Diagnosis not present

## 2020-04-16 DIAGNOSIS — L821 Other seborrheic keratosis: Secondary | ICD-10-CM | POA: Diagnosis not present

## 2020-04-16 DIAGNOSIS — D2272 Melanocytic nevi of left lower limb, including hip: Secondary | ICD-10-CM | POA: Diagnosis not present

## 2020-04-16 DIAGNOSIS — D485 Neoplasm of uncertain behavior of skin: Secondary | ICD-10-CM | POA: Diagnosis not present

## 2020-04-16 DIAGNOSIS — D2261 Melanocytic nevi of right upper limb, including shoulder: Secondary | ICD-10-CM | POA: Diagnosis not present

## 2020-04-16 DIAGNOSIS — D2271 Melanocytic nevi of right lower limb, including hip: Secondary | ICD-10-CM | POA: Diagnosis not present

## 2020-04-17 DIAGNOSIS — M8589 Other specified disorders of bone density and structure, multiple sites: Secondary | ICD-10-CM | POA: Diagnosis not present

## 2020-04-17 DIAGNOSIS — M810 Age-related osteoporosis without current pathological fracture: Secondary | ICD-10-CM | POA: Diagnosis not present

## 2020-04-17 LAB — HM DEXA SCAN

## 2020-04-26 DIAGNOSIS — M25462 Effusion, left knee: Secondary | ICD-10-CM | POA: Diagnosis not present

## 2020-04-30 ENCOUNTER — Other Ambulatory Visit: Payer: Self-pay

## 2020-04-30 ENCOUNTER — Ambulatory Visit: Payer: PPO | Admitting: Podiatry

## 2020-04-30 DIAGNOSIS — IMO0001 Reserved for inherently not codable concepts without codable children: Secondary | ICD-10-CM

## 2020-04-30 DIAGNOSIS — S93402A Sprain of unspecified ligament of left ankle, initial encounter: Secondary | ICD-10-CM

## 2020-05-01 ENCOUNTER — Encounter: Payer: Self-pay | Admitting: Podiatry

## 2020-05-01 NOTE — Progress Notes (Signed)
Subjective:  Patient ID: Sherry Bernard, female    DOB: [birth year],  MRN: [id number]  Chief Complaint  Patient presents with  . Ankle Pain    pt is here for a f/u of ankle pain to the left foot, pt states that she is doing a lot better since the last time she was here, pt has been wearing the boot and, states that her right foot is starting to hurt now    79 y.o. female presents with the above complaint.  Patient presents with a follow-up of left ankle sprain.  Patient states she is doing a lot better.  Her pain is 95% improved.  She has been able to wear the boot without any complaints.  She has not tried to self transition away from the boot yet.  She states the other side of the foot is also hurting because she was overcompensating on the right side because of the uneven surface of the boot.  She denies any other acute complaints.   Review of Systems: Negative except as noted in the HPI. Denies N/V/F/Ch.  Past Medical History:  Diagnosis Date  . Colon polyp   . Hyperthyroidism     Current Outpatient Medications:  .  aspirin EC 81 MG tablet, Take 81 mg by mouth daily. Swallow whole., Disp: , Rfl:  .  BIOTIN PO, Take by mouth. , Disp: , Rfl:  .  Calcium Carbonate-Simethicone 1000-60 MG CHEW, Chew by mouth., Disp: , Rfl:  .  Calcium-Phosphorus-Vitamin D (CITRACAL +D3 PO), Take 2 tablets by mouth daily., Disp: , Rfl:  .  levothyroxine (SYNTHROID) 88 MCG tablet, TAKE 1 TABLET EVERY DAY ON EMPTY STOMACHWITH A GLASS OF WATER AT LEAST 30-60 MINBEFORE BREAKFAST, Disp: 90 tablet, Rfl: 1 .  Lifitegrast (XIIDRA) 5 % SOLN, , Disp: , Rfl:  .  LOTEMAX SM 0.38 % GEL, Place 1 drop into the right eye 4 (four) times daily., Disp: , Rfl:  .  meloxicam (MOBIC) 7.5 MG tablet, Take 7.5 mg by mouth daily., Disp: , Rfl:  .  Multiple Vitamins-Minerals (CENTRUM SILVER 50+WOMEN) TABS, Take 1 tablet by mouth daily., Disp: , Rfl:  .  ofloxacin (OCUFLOX) 0.3 % ophthalmic solution, Place 1 drop into the right eye 4  (four) times daily., Disp: , Rfl:  .  Omega-3 Fatty Acids (FISH OIL PO), Take by mouth., Disp: , Rfl:  .  rosuvastatin (CRESTOR) 10 MG tablet, TAKE 1 TABLET BY MOUTH DAILY, Disp: 90 tablet, Rfl: 1  Social History   Tobacco Use  Smoking Status Never Smoker  Smokeless Tobacco Never Used    No Known Allergies Objective:  There were no vitals filed for this visit. There is no height or weight on file to calculate BMI. Constitutional Well developed. Well nourished.  Vascular Dorsalis pedis pulses palpable bilaterally. Posterior tibial pulses palpable bilaterally. Capillary refill normal to all digits.  No cyanosis or clubbing noted. Pedal hair growth normal.  Neurologic Normal speech. Oriented to person, place, and time. Epicritic sensation to light touch grossly present bilaterally.  Dermatologic Nails well groomed and normal in appearance. No open wounds. No skin lesions.  Orthopedic:  No pain on palpation to the left ATFL.  There is 1+ pitting edema to the area.  No pain on plantarflexion inversion of the foot.  No pain with dorsiflexion eversion of the foot.  His findings were consistent with active and passive range of motion.  No pain at the posterior tibial tendon, peroneal tendon, Achilles tendon.  Radiographs: 3 views of skeletally mature adult left ankle: No osseous abnormalities noted.  Mild anterior osteoarthritic changes noted.  No other bony deformities noted.  Good congruent ankle mortise noted. Assessment:   No diagnosis found. Plan:  Patient was evaluated and treated and all questions answered.  Left ankle sprain grade 1 -Clinically completely resolved with a cam boot.  At this point patient will benefit with a Tri-Lock ankle brace given that she has some mild residual pain to allow for transition from cam boot into regular sneakers. -Educated her on the importance of wearing Tri-Lock ankle brace and ambulating and gradually weaning off over the next several weeks.   Patient states understanding  No follow-ups on file.

## 2020-05-03 ENCOUNTER — Telehealth: Payer: Self-pay | Admitting: Internal Medicine

## 2020-05-03 NOTE — Telephone Encounter (Signed)
DEXA scan abstracted in placed in folder. Shows osteoporosis

## 2020-05-03 NOTE — Telephone Encounter (Signed)
Notify her that her bone density reveals osteoporosis.  Can schedule an appt to discuss treatment options.

## 2020-05-03 NOTE — Telephone Encounter (Signed)
Pt called about bone density test from [facility]. Please advise

## 2020-05-06 ENCOUNTER — Other Ambulatory Visit: Payer: Self-pay

## 2020-05-06 ENCOUNTER — Ambulatory Visit (INDEPENDENT_AMBULATORY_CARE_PROVIDER_SITE_OTHER): Payer: PPO | Admitting: *Deleted

## 2020-05-06 DIAGNOSIS — M25662 Stiffness of left knee, not elsewhere classified: Secondary | ICD-10-CM | POA: Diagnosis not present

## 2020-05-06 DIAGNOSIS — Z96652 Presence of left artificial knee joint: Secondary | ICD-10-CM | POA: Diagnosis not present

## 2020-05-06 DIAGNOSIS — L603 Nail dystrophy: Secondary | ICD-10-CM

## 2020-05-06 DIAGNOSIS — B351 Tinea unguium: Secondary | ICD-10-CM

## 2020-05-06 DIAGNOSIS — M25562 Pain in left knee: Secondary | ICD-10-CM | POA: Diagnosis not present

## 2020-05-06 DIAGNOSIS — M6281 Muscle weakness (generalized): Secondary | ICD-10-CM | POA: Diagnosis not present

## 2020-05-06 NOTE — Progress Notes (Signed)
Patient presents today for the 5th laser treatment. Diagnosed with mycotic nail infection by Dr. Milinda Pointer.   Toenail most affected the hallux right. Her nail has almost grown out completely clear. She hit and cracked the hallux nail left while wearing a boot for her ankle sprain.  All other systems are negative.  Nails were filed thin. Laser therapy was administered to 1st toenails right and patient tolerated the treatment well. All safety precautions were in place.   Trimmed down the hallux nail left. There is a small area of dried blood at the tip of the toenail.   Follow up in 8 weeks for laser # 6.  ~Patient was treated for an ankle sprain by Dr. Posey Pronto and she says its feeling much better~

## 2020-05-08 DIAGNOSIS — Z96652 Presence of left artificial knee joint: Secondary | ICD-10-CM | POA: Diagnosis not present

## 2020-05-08 DIAGNOSIS — M25562 Pain in left knee: Secondary | ICD-10-CM | POA: Diagnosis not present

## 2020-05-08 NOTE — Telephone Encounter (Signed)
Pt aware and scheduled.

## 2020-05-13 DIAGNOSIS — Z96652 Presence of left artificial knee joint: Secondary | ICD-10-CM | POA: Diagnosis not present

## 2020-05-13 DIAGNOSIS — M25562 Pain in left knee: Secondary | ICD-10-CM | POA: Diagnosis not present

## 2020-05-14 ENCOUNTER — Other Ambulatory Visit: Payer: Self-pay

## 2020-05-14 ENCOUNTER — Encounter: Payer: Self-pay | Admitting: Internal Medicine

## 2020-05-14 ENCOUNTER — Telehealth (INDEPENDENT_AMBULATORY_CARE_PROVIDER_SITE_OTHER): Payer: PPO | Admitting: Internal Medicine

## 2020-05-14 DIAGNOSIS — M25572 Pain in left ankle and joints of left foot: Secondary | ICD-10-CM

## 2020-05-14 DIAGNOSIS — M810 Age-related osteoporosis without current pathological fracture: Secondary | ICD-10-CM | POA: Diagnosis not present

## 2020-05-14 MED ORDER — CALCITONIN (SALMON) 200 UNIT/ACT NA SOLN: 1.0000 | Freq: Every day | NASAL | 12 refills | Status: AC

## 2020-05-14 NOTE — Progress Notes (Signed)
Patient ID: Sherry Bernard, female   DOB: October 16, 1940, 79 y.o.   MRN: [id number]   Virtual Visit via video Note  This visit type was conducted due to national recommendations for restrictions regarding the COVID-19 pandemic (e.g. social distancing).  This format is felt to be most appropriate for this patient at this time.  All issues noted in this document were discussed and addressed.  No physical exam was performed (except for noted visual exam findings with Video Visits).   I connected with Sherry Bernard by a video enabled telemedicine application and verified that I am speaking with the correct person using two identifiers. Location patient: home Location provider: work Persons participating in the virtual visit: patient, provider  The limitations, risks, security and privacy concerns of performing an evaluation and management service by video and the availability of in person appointments have been discussed.  It has also been discussed with the patient that there may be a patient responsible charge related to this service. The patient expressed understanding and agreed to proceed.   Reason for visit: work in appt  HPI: Here to discuss her bone density.  Also had a recent ankle sprain.  Seeing podiatry.  Ankle brace - weaning off.  PT.  Improved.  Discussed bone density results.  Tscore improved - right femur neck and right femur.  Total spine improved.  Osteoporosis - wrist.  Discussed treatment options.  Discussed calcium and vitamin D.  Discussed oral bisphosphonates, reclast and miacalcin.      ROS: See pertinent positives and negatives per HPI.  Past Medical History:  Diagnosis Date  . Colon polyp   . Hyperthyroidism     Past Surgical History:  Procedure Laterality Date  . APPENDECTOMY     child  . COLONOSCOPY WITH PROPOFOL N/A 06/26/2019   Procedure: COLONOSCOPY WITH PROPOFOL;  Surgeon: Lollie Sails, MD;  Location: [facility] ENDOSCOPY;  Service: Endoscopy;  Laterality: N/A;    . TONSILECTOMY, ADENOIDECTOMY, BILATERAL MYRINGOTOMY AND TUBES     child    Family History  Problem Relation Age of Onset  . Breast cancer Mother     SOCIAL HX: reviewed.    Current Outpatient Medications:  .  aspirin EC 81 MG tablet, Take 81 mg by mouth daily. Swallow whole., Disp: , Rfl:  .  BIOTIN PO, Take by mouth. , Disp: , Rfl:  .  calcitonin, salmon, (MIACALCIN) 200 UNIT/ACT nasal spray, Place 1 spray into alternate nostrils daily., Disp: 3.7 mL, Rfl: 12 .  Calcium Carbonate-Simethicone 1000-60 MG CHEW, Chew by mouth., Disp: , Rfl:  .  Calcium-Phosphorus-Vitamin D (CITRACAL +D3 PO), Take 2 tablets by mouth daily., Disp: , Rfl:  .  levothyroxine (SYNTHROID) 88 MCG tablet, TAKE 1 TABLET EVERY DAY ON EMPTY STOMACHWITH A GLASS OF WATER AT LEAST 30-60 MINBEFORE BREAKFAST, Disp: 90 tablet, Rfl: 1 .  Lifitegrast (XIIDRA) 5 % SOLN, , Disp: , Rfl:  .  LOTEMAX SM 0.38 % GEL, Place 1 drop into the right eye 4 (four) times daily., Disp: , Rfl:  .  meloxicam (MOBIC) 7.5 MG tablet, Take 7.5 mg by mouth daily., Disp: , Rfl:  .  Multiple Vitamins-Minerals (CENTRUM SILVER 50+WOMEN) TABS, Take 1 tablet by mouth daily., Disp: , Rfl:  .  ofloxacin (OCUFLOX) 0.3 % ophthalmic solution, Place 1 drop into the right eye 4 (four) times daily., Disp: , Rfl:  .  Omega-3 Fatty Acids (FISH OIL PO), Take by mouth., Disp: , Rfl:  .  rosuvastatin (CRESTOR) 10  MG tablet, TAKE 1 TABLET BY MOUTH DAILY, Disp: 90 tablet, Rfl: 1  EXAM:  GENERAL: alert, oriented, appears well and in no acute distress  HEENT: atraumatic, conjunttiva clear, no obvious abnormalities on inspection of external nose and ears  NECK: normal movements of the head and neck  LUNGS: on inspection no signs of respiratory distress, breathing rate appears normal, no obvious gross SOB, gasping or wheezing  CV: no obvious cyanosis  PSYCH/NEURO: pleasant and cooperative, no obvious depression or anxiety, speech and thought processing grossly  intact  ASSESSMENT AND PLAN:  Discussed the following assessment and plan:  Osteoporosis Bone density as outlined.  Discussed results.  Discussed treatment options. Vitamin D and calcium.  After discussion and given improvement in back and hip, will start miacalcin nasal spray.  Follow.  Weight bearing exercise.    Left ankle pain Seeing podiatry.  Brace.  Weaning.  PT.  Doing better.    Meds ordered this encounter  Medications  . calcitonin, salmon, (MIACALCIN) 200 UNIT/ACT nasal spray    Sig: Place 1 spray into alternate nostrils daily.    Dispense:  3.7 mL    Refill:  12     I discussed the assessment and treatment plan with the patient. The patient was provided an opportunity to ask questions and all were answered. The patient agreed with the plan and demonstrated an understanding of the instructions.   The patient was advised to call back or seek an in-person evaluation if the symptoms worsen or if the condition fails to improve as anticipated.   Einar Pheasant, MD

## 2020-05-15 DIAGNOSIS — M25562 Pain in left knee: Secondary | ICD-10-CM | POA: Diagnosis not present

## 2020-05-15 DIAGNOSIS — Z96652 Presence of left artificial knee joint: Secondary | ICD-10-CM | POA: Diagnosis not present

## 2020-05-16 DIAGNOSIS — C4441 Basal cell carcinoma of skin of scalp and neck: Secondary | ICD-10-CM | POA: Diagnosis not present

## 2020-05-17 NOTE — Telephone Encounter (Signed)
err

## 2020-05-18 ENCOUNTER — Encounter: Payer: Self-pay | Admitting: Internal Medicine

## 2020-05-18 NOTE — Assessment & Plan Note (Signed)
Bone density as outlined.  Discussed results.  Discussed treatment options. Vitamin D and calcium.  After discussion and given improvement in back and hip, will start miacalcin nasal spray.  Follow.  Weight bearing exercise.

## 2020-05-18 NOTE — Assessment & Plan Note (Signed)
Seeing podiatry.  Brace.  Weaning.  PT.  Doing better.

## 2020-05-20 DIAGNOSIS — M25562 Pain in left knee: Secondary | ICD-10-CM | POA: Diagnosis not present

## 2020-05-20 DIAGNOSIS — Z96652 Presence of left artificial knee joint: Secondary | ICD-10-CM | POA: Diagnosis not present

## 2020-05-22 DIAGNOSIS — M25562 Pain in left knee: Secondary | ICD-10-CM | POA: Diagnosis not present

## 2020-05-22 DIAGNOSIS — Z96652 Presence of left artificial knee joint: Secondary | ICD-10-CM | POA: Diagnosis not present

## 2020-05-27 DIAGNOSIS — Z20822 Contact with and (suspected) exposure to covid-19: Secondary | ICD-10-CM | POA: Diagnosis not present

## 2020-05-28 DIAGNOSIS — Z96652 Presence of left artificial knee joint: Secondary | ICD-10-CM | POA: Diagnosis not present

## 2020-05-28 DIAGNOSIS — M25562 Pain in left knee: Secondary | ICD-10-CM | POA: Diagnosis not present

## 2020-05-30 ENCOUNTER — Telehealth: Payer: Self-pay | Admitting: *Deleted

## 2020-05-30 DIAGNOSIS — M810 Age-related osteoporosis without current pathological fracture: Secondary | ICD-10-CM

## 2020-05-30 DIAGNOSIS — E7800 Pure hypercholesterolemia, unspecified: Secondary | ICD-10-CM

## 2020-05-30 NOTE — Telephone Encounter (Signed)
Please place future orders for lab appt.  

## 2020-05-31 NOTE — Telephone Encounter (Signed)
Orders placed for f/u labs.  

## 2020-06-05 ENCOUNTER — Other Ambulatory Visit: Payer: Self-pay

## 2020-06-05 ENCOUNTER — Other Ambulatory Visit (INDEPENDENT_AMBULATORY_CARE_PROVIDER_SITE_OTHER): Payer: PPO

## 2020-06-05 DIAGNOSIS — E7800 Pure hypercholesterolemia, unspecified: Secondary | ICD-10-CM

## 2020-06-05 DIAGNOSIS — M25562 Pain in left knee: Secondary | ICD-10-CM | POA: Diagnosis not present

## 2020-06-05 DIAGNOSIS — M810 Age-related osteoporosis without current pathological fracture: Secondary | ICD-10-CM

## 2020-06-05 DIAGNOSIS — Z96652 Presence of left artificial knee joint: Secondary | ICD-10-CM | POA: Diagnosis not present

## 2020-06-05 LAB — LIPID PANEL
Cholesterol: 123 mg/dL (ref 0–200)
HDL: 78.3 mg/dL (ref >39.00)
LDL Cholesterol: 34 mg/dL (ref 0–99)
NonHDL: 45.02
Total CHOL/HDL Ratio: 2
Triglycerides: 53 mg/dL (ref 0.0–149.0)
VLDL: 10.6 mg/dL (ref 0.0–40.0)

## 2020-06-05 LAB — HEPATIC FUNCTION PANEL
ALT: 74 U/L — ABNORMAL HIGH (ref 0–35)
AST: 88 U/L — ABNORMAL HIGH (ref 0–37)
Albumin: 3.9 g/dL (ref 3.5–5.2)
Alkaline Phosphatase: 64 U/L (ref 39–117)
Bilirubin, Direct: 0.1 mg/dL (ref 0.0–0.3)
Total Bilirubin: 0.4 mg/dL (ref 0.2–1.2)
Total Protein: 6.9 g/dL (ref 6.0–8.3)

## 2020-06-05 LAB — TSH: TSH: 1.21 u[IU]/mL (ref 0.35–4.50)

## 2020-06-05 LAB — VITAMIN D 25 HYDROXY (VIT D DEFICIENCY, FRACTURES): VITD: 55.63 ng/mL (ref 30.00–100.00)

## 2020-06-05 LAB — BASIC METABOLIC PANEL
BUN: 19 mg/dL (ref 6–23)
CO2: 29 mEq/L (ref 19–32)
Calcium: 8.9 mg/dL (ref 8.4–10.5)
Chloride: 102 mEq/L (ref 96–112)
Creatinine, Ser: 0.86 mg/dL (ref 0.40–1.20)
GFR: 63.72 mL/min (ref >60.00)
Glucose, Bld: 90 mg/dL (ref 70–99)
Potassium: 3.2 mEq/L — ABNORMAL LOW (ref 3.5–5.1)
Sodium: 139 mEq/L (ref 135–145)

## 2020-06-06 ENCOUNTER — Other Ambulatory Visit: Payer: Self-pay | Admitting: Internal Medicine

## 2020-06-06 DIAGNOSIS — E876 Hypokalemia: Secondary | ICD-10-CM

## 2020-06-06 DIAGNOSIS — R7989 Other specified abnormal findings of blood chemistry: Secondary | ICD-10-CM

## 2020-06-06 NOTE — Progress Notes (Signed)
Orders placed for f/u labs.  

## 2020-06-07 ENCOUNTER — Telehealth: Payer: Self-pay | Admitting: Internal Medicine

## 2020-06-07 DIAGNOSIS — Z96652 Presence of left artificial knee joint: Secondary | ICD-10-CM | POA: Diagnosis not present

## 2020-06-07 DIAGNOSIS — M25562 Pain in left knee: Secondary | ICD-10-CM | POA: Diagnosis not present

## 2020-06-07 NOTE — Telephone Encounter (Signed)
Pt called back about lab results.  

## 2020-06-11 DIAGNOSIS — Z96652 Presence of left artificial knee joint: Secondary | ICD-10-CM | POA: Diagnosis not present

## 2020-06-11 DIAGNOSIS — M25562 Pain in left knee: Secondary | ICD-10-CM | POA: Diagnosis not present

## 2020-06-12 ENCOUNTER — Telehealth: Payer: Self-pay | Admitting: Internal Medicine

## 2020-06-12 NOTE — Telephone Encounter (Signed)
Pt called and wanting to talk about calcitonin, salmon, (MIACALCIN) 200 UNIT/ACT nasal spray  She feels that it isn't helping   Pt also stated that she had a fall last week and hit her head. She said that she is not having any symptoms other than the spot being tender

## 2020-06-13 ENCOUNTER — Ambulatory Visit
Admission: EM | Admit: 2020-06-13 | Discharge: 2020-06-13 | Disposition: A | Discharge status: Home / Self Care | Payer: PPO | Attending: Family Medicine | Admitting: Family Medicine

## 2020-06-13 ENCOUNTER — Encounter: Payer: Self-pay | Admitting: Emergency Medicine

## 2020-06-13 ENCOUNTER — Other Ambulatory Visit (INDEPENDENT_AMBULATORY_CARE_PROVIDER_SITE_OTHER): Payer: PPO

## 2020-06-13 ENCOUNTER — Ambulatory Visit (INDEPENDENT_AMBULATORY_CARE_PROVIDER_SITE_OTHER): Payer: PPO

## 2020-06-13 ENCOUNTER — Telehealth: Payer: Self-pay

## 2020-06-13 ENCOUNTER — Other Ambulatory Visit: Payer: Self-pay

## 2020-06-13 DIAGNOSIS — E876 Hypokalemia: Secondary | ICD-10-CM

## 2020-06-13 DIAGNOSIS — R7989 Other specified abnormal findings of blood chemistry: Secondary | ICD-10-CM

## 2020-06-13 DIAGNOSIS — R945 Abnormal results of liver function studies: Secondary | ICD-10-CM | POA: Diagnosis not present

## 2020-06-13 DIAGNOSIS — R9082 White matter disease, unspecified: Secondary | ICD-10-CM | POA: Diagnosis not present

## 2020-06-13 DIAGNOSIS — S0990XA Unspecified injury of head, initial encounter: Secondary | ICD-10-CM | POA: Diagnosis not present

## 2020-06-13 DIAGNOSIS — G9389 Other specified disorders of brain: Secondary | ICD-10-CM | POA: Diagnosis not present

## 2020-06-13 DIAGNOSIS — M542 Cervicalgia: Secondary | ICD-10-CM | POA: Diagnosis not present

## 2020-06-13 DIAGNOSIS — M47812 Spondylosis without myelopathy or radiculopathy, cervical region: Secondary | ICD-10-CM | POA: Diagnosis not present

## 2020-06-13 DIAGNOSIS — Z96652 Presence of left artificial knee joint: Secondary | ICD-10-CM | POA: Diagnosis not present

## 2020-06-13 DIAGNOSIS — S0101XA Laceration without foreign body of scalp, initial encounter: Secondary | ICD-10-CM | POA: Diagnosis not present

## 2020-06-13 DIAGNOSIS — M4312 Spondylolisthesis, cervical region: Secondary | ICD-10-CM | POA: Diagnosis not present

## 2020-06-13 LAB — HEPATIC FUNCTION PANEL
ALT: 38 U/L — ABNORMAL HIGH (ref 0–35)
AST: 32 U/L (ref 0–37)
Albumin: 4.2 g/dL (ref 3.5–5.2)
Alkaline Phosphatase: 81 U/L (ref 39–117)
Bilirubin, Direct: 0.1 mg/dL (ref 0.0–0.3)
Total Bilirubin: 0.6 mg/dL (ref 0.2–1.2)
Total Protein: 6.5 g/dL (ref 6.0–8.3)

## 2020-06-13 LAB — POTASSIUM: Potassium: 4 mEq/L (ref 3.5–5.1)

## 2020-06-13 NOTE — Telephone Encounter (Signed)
Patient is at [facility] being evaluated now. Advised will discuss medication changes at appt.

## 2020-06-13 NOTE — Telephone Encounter (Signed)
Patient came into office today for an same day nurse visit. See note.

## 2020-06-13 NOTE — Discharge Instructions (Signed)
Tylenol as needed.  Rest.  Take care  Dr. Lacinda Axon

## 2020-06-13 NOTE — Telephone Encounter (Cosign Needed)
Patient came into clinic due to her falling last Monday and hit her head. She stated she went right back to bed after she fell. Patient stated she awoken with little blood on pillow. Patient denied feeling nausea, vomiting, blurry vision, slurred speech, sensitivity to light, and headache. She did have soreness where laceration and knot was located back left of head. Patient stated her hairdresser cleaned the wound and took a picture so patient could see how bad the wound was. Patient didn't feel the need to go be seen for sx. Patient wanted [place] to take a look at her head since she can in for labs. There was no signs of infection, fever, chills, erythema, blood, and discharge/discoloration. Knot on patients head was slightly raised. Instructed patient to be evaluated at an UC due to no available appointments. Patient also stated she would like Dr Nicki Reaper to switch her nose spray for osteoporosis to a pill form, she is getting confused on directions.   Patient stated she will go to UC to be evaluated.  BP: 116/75 BPM: 87 Temp: 97.6 Spo2: 97

## 2020-06-13 NOTE — Telephone Encounter (Signed)
Reviewed.  Need to confirm pt doing ok if had head injury.  Regarding the medication for osteoporosis, if she is having problems can stop nasal spray and I can discuss with her regarding the oral medication at her f/u appt.  These are not the same medications.

## 2020-06-13 NOTE — ED Provider Notes (Signed)
[facility]    CSN: [id number] Arrival date & time: 06/13/20  1532      History   Chief Complaint Chief Complaint  Patient presents with  . Fall   HPI  Patient reports that she got up in the middle of the night last Monday.  She went to use the restroom and subsequently came back to bed.  She woke up in the morning and noticed some blood on the pillow.  She does not recall suffering a fall or any injury.  She states that she did not notice a laceration or injury.  On Friday of last week she went to her hairdresser and a large laceration was noted.  Patient did not think it needed repair and thus went on about her way.  She saw her primary care physician's office today for blood work and wanted to be examined.  She was seen by the CMA and directed here for further evaluation.  Patient reports that she has headache/pain particular around the site of the laceration.  Patient is still unclear of how she got it.  She denies any syncope, fall.  Patient denies dizziness, nausea, vomiting.  No vision changes.  No weakness.  Patient is concerned given her injury.  No other complaints at this time.  Past Medical History:  Diagnosis Date  . Colon polyp   . Hyperthyroidism     Patient Active Problem List   Diagnosis Date Noted  . Left ankle pain 04/07/2020  . Hair loss 01/08/2020  . Pre-op examination 01/08/2020  . Hypercholesteremia 01/08/2020  . Left leg swelling 11/15/2019  . Finger infection 03/05/2019  . Skin infection 01/04/2019  . URI (upper respiratory infection) 01/04/2019  . Healthcare maintenance 12/05/2018  . Left shoulder pain 03/12/2018  . Anxiety 08/18/2017  . Hematuria 08/16/2017  . Low back pain 01/10/2017  . Encounter for counseling 11/06/2016  . Cough 10/06/2016  . Diarrhea 10/06/2016  . Fibrocystic breast disease 05/05/2016  . Osteoporosis 05/05/2016  . Cyst of pancreas 08/13/2015  . 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 Acquired hypothyroidism  08/02/2014  . Rhytides 11/20/2013  . Postmenopausal 10/13/1991    Past Surgical History:  Procedure Laterality Date  . APPENDECTOMY     child  . COLONOSCOPY WITH PROPOFOL N/A 06/26/2019   Procedure: COLONOSCOPY WITH PROPOFOL;  Surgeon: Lollie Sails, MD;  Location: [facility] ENDOSCOPY;  Service: Endoscopy;  Laterality: N/A;  . TONSILECTOMY, ADENOIDECTOMY, BILATERAL MYRINGOTOMY AND TUBES     child    OB History   No obstetric history on file.      Home Medications    Prior to Admission medications   Medication Sig Start Date End Date Taking? Authorizing Provider  aspirin EC 81 MG tablet Take 81 mg by mouth daily. Swallow whole.   Yes [provider]  BIOTIN PO Take by mouth.    Yes [provider]  calcitonin, salmon, (MIACALCIN) 200 UNIT/ACT nasal spray Place 1 spray into alternate nostrils daily. 05/14/20  Yes Einar Pheasant, MD  Calcium-Phosphorus-Vitamin D (CITRACAL +D3 PO) Take 2 tablets by mouth daily.   Yes [provider]  levothyroxine (SYNTHROID) 88 MCG tablet TAKE 1 TABLET EVERY DAY ON EMPTY STOMACHWITH A GLASS OF WATER AT LEAST 30-60 MINBEFORE BREAKFAST 04/16/20  Yes Einar Pheasant, MD  Multiple Vitamins-Minerals (CENTRUM SILVER 50+WOMEN) TABS Take 1 tablet by mouth daily.   Yes [provider]  Omega-3 Fatty Acids (FISH OIL PO) Take by mouth.   Yes [provider]  rosuvastatin (CRESTOR) 10 MG tablet TAKE 1 TABLET BY MOUTH DAILY 04/16/20  Yes Leone Haven, MD  Calcium Carbonate-Simethicone 1000-60 MG CHEW Chew by mouth.    [provider]  Lifitegrast Shirley Friar) 5 % SOLN  05/04/19   [provider]  LOTEMAX SM 0.38 % GEL Place 1 drop into the right eye 4 (four) times daily. 05/11/19   [provider]  ofloxacin (OCUFLOX) 0.3 % ophthalmic solution Place 1 drop into the right eye 4 (four) times daily. 06/06/19   [provider]    Family History Family History  Problem Relation Age of Onset   . Breast cancer Mother     Social History Social History   Tobacco Use  . Smoking status: Never Smoker  . Smokeless tobacco: Never Used  Vaping Use  . Vaping Use: Never used  Substance Use Topics  . Alcohol use: Yes    Alcohol/week: 2.0 - 3.0 standard drinks    Types: 2 - 3 Glasses of wine per week    Comment: socially  . Drug use: No     Allergies   Patient has no known allergies.   Review of Systems Review of Systems  HENT:       Head injury.  Skin: Positive for wound.   Physical Exam Triage Vital Signs ED Triage Vitals  Enc Vitals Group     BP 06/13/20 1617 138/84     Pulse Rate 06/13/20 1617 70     Resp 06/13/20 1617 18     Temp 06/13/20 1617 98.1 F (36.7 C)     Temp Source 06/13/20 1617 Oral     SpO2 06/13/20 1617 98 %     Weight 06/13/20 1613 120 lb 13 oz (54.8 kg)     Height 06/13/20 1613 5\' 4"  (1.626 m)     Head Circumference --      Peak Flow --      Pain Score 06/13/20 1613 1     Pain Loc --      Pain Edu? --      Excl. in [place]? --    Updated Vital Signs BP 138/84 (BP Location: Right Arm)   Pulse 70   Temp 98.1 F (36.7 C) (Oral)   Resp 18   Ht 5\' 4"  (1.626 m)   Wt 54.8 kg   SpO2 98%   BMI 20.74 kg/m   Visual Acuity Right Eye Distance:   Left Eye Distance:   Bilateral Distance:    Right Eye Near:   Left Eye Near:    Bilateral Near:     Physical Exam Constitutional:      General: She is not in acute distress.    Appearance: Normal appearance. She is not ill-appearing.  HENT:     Head:      Comments: Healed linear laceration noted at the labeled location.  Surrounding area mildly tender to palpation. Eyes:     General:        Right eye: No discharge.        Left eye: No discharge.     Extraocular Movements: Extraocular movements intact.     Conjunctiva/sclera: Conjunctivae normal.     Pupils: Pupils are equal, round, and reactive to light.  Cardiovascular:     Rate and Rhythm: Normal rate and regular rhythm.  Pulmonary:       Effort: Pulmonary effort is normal.     Breath sounds: Normal breath sounds. No wheezing, rhonchi or rales.  Neurological:     General: No focal deficit present.     Mental Status: She is alert.     Cranial Nerves: No cranial nerve deficit.     Motor: No weakness.  Psychiatric:        Mood and Affect: Mood normal.        Behavior: Behavior normal.    UC Treatments / Results  Labs (all labs ordered are listed, but only abnormal results are displayed) Labs Reviewed - No data to display  EKG   Radiology DG Cervical Spine Complete  Result Date: 06/13/2020 CLINICAL DATA:  Neck pain, fall, posterior scalp laceration EXAM: CERVICAL SPINE - COMPLETE 4+ VIEW COMPARISON:  Radiographs 03/03/2016 FINDINGS: Mild straightening of the normal cervical lordosis. 3 mm of anterolisthesis C4 on 5 is unchanged from the comparison study. Favored to be on a degenerative basis given chronicity and spondylitic changes at this level. No evidence of traumatic listhesis. No abnormally widened, perched or jumped facets. Normal alignment of the craniocervical and atlantoaxial articulations. Multilevel intervertebral disc height loss with spondylitic endplate changes. Multilevel uncinate spurring and facet degenerative changes present as well resulting in some mild to moderate multilevel foraminal narrowing most pronounced on the right at C5-6, C6-7. No prevertebral swelling or gas. Airways patent. No acute abnormality in the upper chest or imaged lung apices. Grid artifact noted incidentally. IMPRESSION: 1. No acute abnormality of the cervical spine. Please note: Spine radiography has limited sensitivity and specificity in the setting of significant trauma. If there is significant mechanism, recommend low threshold for CT imaging. 2. Stable 3 mm of anterolisthesis C4 on 5, likely on a degenerative basis. 3. 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 Electronically  Signed   By: Lovena Le M.D.   On: 06/13/2020 16:58   CT Head Wo Contrast  Result Date: 06/13/2020 CLINICAL DATA:  Fall, head injury EXAM: CT HEAD WITHOUT CONTRAST TECHNIQUE: Contiguous axial images were obtained from the base of the skull through the vertex without intravenous contrast. COMPARISON:  None. FINDINGS: Brain: Generalized atrophy with mild ventricular enlargement most likely due to atrophy. Patchy white matter hypodensity bilaterally appears chronic. Negative for acute infarct, hemorrhage,. Dural based calcification left parietal convexity measuring approximately 12 mm in diameter. Possible meningioma. Vascular: Negative for hyperdense vessel Skull: Negative for skull fracture. 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 Electronically Signed   By: Franchot Gallo M.D.   On: 06/13/2020 17:10    Procedures Procedures (including critical care time)  Medications Ordered in UC Medications - No data to display  Initial Impression / Assessment and Plan / UC Course  I have reviewed the triage vital signs and the nursing notes.  Pertinent labs & imaging results that were available during my care of the patient were reviewed by me and considered in my medical decision making (see chart for details).    79 year old female presents with recent head injury and laceration.  This is an acute complicated injury given the nature of her injury as well as her age.  CT head was obtained and was independently reviewed.  Independent interpretation: No acute intracranial abnormalities.  X-ray of the cervical spine was obtained and revealed no acute abnormalities.  Degenerative changes noted.  Laceration well-healed.  Tylenol as needed.  Supportive care.  Final Clinical Impressions(s) / UC Diagnoses  Final diagnoses:    Laceration of scalp, initial encounter  Minor head injury, initial encounter     Discharge Instructions     Tylenol as needed.  Rest.  Take care  Dr. Lacinda Axon    ED Prescriptions    None     PDMP not reviewed this encounter.   Coral Spikes, [place] 06/13/20 2038

## 2020-06-13 NOTE — ED Notes (Signed)
Patient does not need prior auth for CPT [phone].

## 2020-06-13 NOTE — ED Triage Notes (Signed)
Pt states she fell in the middle of the night and hit her head and had a laceration on the back of her head. Denies dizziness, nausea or vomiting. She had a headache for a couple days after wards. She states she is still very tender to the area

## 2020-06-18 DIAGNOSIS — Z96652 Presence of left artificial knee joint: Secondary | ICD-10-CM | POA: Diagnosis not present

## 2020-06-18 DIAGNOSIS — M25562 Pain in left knee: Secondary | ICD-10-CM | POA: Diagnosis not present

## 2020-06-19 ENCOUNTER — Encounter: Payer: Self-pay | Admitting: Internal Medicine

## 2020-06-20 DIAGNOSIS — M25562 Pain in left knee: Secondary | ICD-10-CM | POA: Diagnosis not present

## 2020-06-20 DIAGNOSIS — Z96652 Presence of left artificial knee joint: Secondary | ICD-10-CM | POA: Diagnosis not present

## 2020-06-21 ENCOUNTER — Encounter: Payer: PPO | Admitting: Plastic Surgery

## 2020-06-24 DIAGNOSIS — M25562 Pain in left knee: Secondary | ICD-10-CM | POA: Diagnosis not present

## 2020-06-24 DIAGNOSIS — Z96652 Presence of left artificial knee joint: Secondary | ICD-10-CM | POA: Diagnosis not present

## 2020-06-28 DIAGNOSIS — Z96652 Presence of left artificial knee joint: Secondary | ICD-10-CM | POA: Diagnosis not present

## 2020-06-28 DIAGNOSIS — M25562 Pain in left knee: Secondary | ICD-10-CM | POA: Diagnosis not present

## 2020-07-01 DIAGNOSIS — M25562 Pain in left knee: Secondary | ICD-10-CM | POA: Diagnosis not present

## 2020-07-01 DIAGNOSIS — Z96652 Presence of left artificial knee joint: Secondary | ICD-10-CM | POA: Diagnosis not present

## 2020-07-03 DIAGNOSIS — M25562 Pain in left knee: Secondary | ICD-10-CM | POA: Diagnosis not present

## 2020-07-03 DIAGNOSIS — Z96652 Presence of left artificial knee joint: Secondary | ICD-10-CM | POA: Diagnosis not present

## 2020-07-04 ENCOUNTER — Other Ambulatory Visit: Payer: Self-pay

## 2020-07-04 ENCOUNTER — Ambulatory Visit (INDEPENDENT_AMBULATORY_CARE_PROVIDER_SITE_OTHER): Payer: PPO | Admitting: Internal Medicine

## 2020-07-04 DIAGNOSIS — R413 Other amnesia: Secondary | ICD-10-CM

## 2020-07-04 DIAGNOSIS — F419 Anxiety disorder, unspecified: Secondary | ICD-10-CM

## 2020-07-04 DIAGNOSIS — D329 Benign neoplasm of meninges, unspecified: Secondary | ICD-10-CM

## 2020-07-04 DIAGNOSIS — E7800 Pure hypercholesterolemia, unspecified: Secondary | ICD-10-CM

## 2020-07-04 NOTE — Progress Notes (Signed)
urPatient ID: Sherry Bernard, female   DOB: July 15, 1941, 79 y.o.   MRN: [id number]   Subjective:    Patient ID: Sherry Bernard, female    DOB: 12/11/1940, 79 y.o.   MRN: [id number]  HPI This visit occurred during the SARS-CoV-2 public health emergency.  Safety protocols were in place, including screening questions prior to the visit, additional usage of staff PPE, and extensive cleaning of exam room while observing appropriate contact time as indicated for disinfecting solutions.  Patient here for a scheduled follow up.  She was seen on 06/13/20 for laceration of scalp.  See UC note.  Laceration healed.  On questioning her, she is not sure how she injured her head.  States she noticed some minimal blood on her pillow one morning when she woke up.  Did not think anything about it.  No headache.  No dizziness.  Went to her Emergency planning/management officer and she is the one that told her she had the laceration.  CT head in UC - no acute abnormality.  C-spine - degenerative changes noted.  Continues to deny any headache.  No dizziness. No chest pain or tightness.  No other injuries.  On questioning her, again she does not remember any fall.  She was questioning if she could have stumbled back and hit her head on the door while going to the bathroom, but is unsure.  Lesion is healed now.  She is anxious about this and about her other health issues.  She is s/p left partial knee replacement.  Going to rehab.  Knee seems go be doing well.     Past Medical History:  Diagnosis Date  . Colon polyp   . Hyperthyroidism    Past Surgical History:  Procedure Laterality Date  . APPENDECTOMY     child  . COLONOSCOPY WITH PROPOFOL N/A 06/26/2019   Procedure: COLONOSCOPY WITH PROPOFOL;  Surgeon: Lollie Sails, MD;  Location: [facility] ENDOSCOPY;  Service: Endoscopy;  Laterality: N/A;  . TONSILECTOMY, ADENOIDECTOMY, BILATERAL MYRINGOTOMY AND TUBES     child   Family History  Problem Relation Age of Onset  . Breast cancer Mother     Social History   Socioeconomic History  . Marital status: Married    Spouse name: Not on file  . Number of children: 3  . Years of education: Not on file  . Highest education level: Not on file  Occupational History  . Occupation: retired  Tobacco Use  . Smoking status: Never Smoker  . Smokeless tobacco: Never Used  Vaping Use  . Vaping Use: Never used  Substance and Sexual Activity  . Alcohol use: Yes    Alcohol/week: 2.0 - 3.0 standard drinks    Types: 2 - 3 Glasses of wine per week    Comment: socially  . Drug use: No  . Sexual activity: Yes  Other Topics Concern  . Not on file  Social History Narrative  . Not on file   Social Determinants of Health   Financial Resource Strain: Low Risk   . Difficulty of Paying Living Expenses: Not hard at all  Food Insecurity: No Food Insecurity  . Worried About Charity fundraiser in the Last Year: Never true  . Ran Out of Food in the Last Year: Never true  Transportation Needs: No Transportation Needs  . Lack of Transportation (Medical): No  . Lack of Transportation (Non-Medical): No  Physical Activity:   . Days of Exercise per Week: Not on file  .  Minutes of Exercise per Session: Not on file  Stress: No Stress Concern Present  . Feeling of Stress : Not at all  Social Connections: Unknown  . Frequency of Communication with Friends and Family: More than three times a week  . Frequency of Social Gatherings with Friends and Family: More than three times a week  . Attends Religious Services: Not on file  . Active Member of Clubs or Organizations: Yes  . Attends Archivist Meetings: Not on file  . Marital Status: Married    Outpatient Encounter Medications as of 07/04/2020  Medication Sig  . aspirin EC 81 MG tablet Take 81 mg by mouth daily. Swallow whole.  Marland Kitchen BIOTIN PO Take by mouth.   . calcitonin, salmon, (MIACALCIN) 200 UNIT/ACT nasal spray Place 1 spray into alternate nostrils daily.  . Calcium  Carbonate-Simethicone 1000-60 MG CHEW Chew by mouth.  . Calcium-Phosphorus-Vitamin D (CITRACAL +D3 PO) Take 2 tablets by mouth daily.  Marland Kitchen levothyroxine (SYNTHROID) 88 MCG tablet TAKE 1 TABLET EVERY DAY ON EMPTY STOMACHWITH A GLASS OF WATER AT LEAST 30-60 MINBEFORE BREAKFAST  . Lifitegrast (XIIDRA) 5 % SOLN   . LOTEMAX SM 0.38 % GEL Place 1 drop into the right eye 4 (four) times daily.  . Multiple Vitamins-Minerals (CENTRUM SILVER 50+WOMEN) TABS Take 1 tablet by mouth daily.  Marland Kitchen ofloxacin (OCUFLOX) 0.3 % ophthalmic solution Place 1 drop into the right eye 4 (four) times daily.  . Omega-3 Fatty Acids (FISH OIL PO) Take by mouth.  . rosuvastatin (CRESTOR) 10 MG tablet TAKE 1 TABLET BY MOUTH DAILY   No facility-administered encounter medications on file as of 07/04/2020.    Review of Systems  Constitutional: Negative for appetite change and unexpected weight change.  HENT: Negative for sinus pressure.   Respiratory: Negative for cough, chest tightness and shortness of breath.   Cardiovascular: Negative for chest pain, palpitations and leg swelling.  Gastrointestinal: Negative for abdominal pain, diarrhea, nausea and vomiting.  Genitourinary: Negative for difficulty urinating and dysuria.  Musculoskeletal: Negative for myalgias and neck pain.  Skin: Negative for color change and rash.       Well healed laceration - scalp  Neurological: Negative for dizziness, light-headedness and headaches.  Psychiatric/Behavioral: Negative for dysphoric mood.       Increased stress and anxiety related to her health issues.         Objective:    Physical Exam Vitals reviewed.  Constitutional:      General: She is not in acute distress.    Appearance: Normal appearance.  HENT:     Head: Normocephalic.     Comments: Laceration - healed.  No evidence of infection.     Right Ear: External ear normal.     Left Ear: External ear normal.  Eyes:     General: No scleral icterus.       Right eye: No  discharge.        Left eye: No discharge.     Conjunctiva/sclera: Conjunctivae normal.  Neck:     Thyroid: No thyromegaly.  Cardiovascular:     Rate and Rhythm: Normal rate and regular rhythm.  Pulmonary:     Effort: No respiratory distress.     Breath sounds: Normal breath sounds. No wheezing.  Abdominal:     General: Bowel sounds are normal.     Palpations: Abdomen is soft.     Tenderness: There is no abdominal tenderness.  Musculoskeletal:        General: No  swelling or tenderness.     Cervical back: Neck supple. No tenderness.  Lymphadenopathy:     Cervical: No cervical adenopathy.  Skin:    Findings: No erythema or rash.  Neurological:     Mental Status: She is alert.  Psychiatric:        Mood and Affect: Mood normal.        Behavior: Behavior normal.     BP 114/70   Pulse 70   Temp 97.7 F (36.5 C) (Oral)   Resp 16   Ht 5\' 4"  (1.626 m)   Wt 119 lb (54 kg)   SpO2 99%   BMI 20.43 kg/m  Wt Readings from Last 3 Encounters:  07/09/20 119 lb (54 kg)  07/04/20 119 lb (54 kg)  06/13/20 120 lb 13 oz (54.8 kg)     Lab Results  Component Value Date   WBC 8.2 01/23/2020   HGB 12.5 01/23/2020   HCT 37.0 01/23/2020   PLT 268.0 01/23/2020   GLUCOSE 90 06/05/2020   CHOL 123 06/05/2020   TRIG 53.0 06/05/2020   HDL 78.30 06/05/2020   LDLCALC 34 06/05/2020   ALT 38 (H) 06/13/2020   AST 32 06/13/2020   NA 139 06/05/2020   K 4.0 06/13/2020   CL 102 06/05/2020   CREATININE 0.86 06/05/2020   BUN 19 06/05/2020   CO2 29 06/05/2020   TSH 1.21 06/05/2020   INR 0.8 02/29/2012    DG Cervical Spine Complete  Result Date: 06/13/2020 CLINICAL DATA:  Neck pain, fall, posterior scalp laceration EXAM: CERVICAL SPINE - COMPLETE 4+ VIEW COMPARISON:  Radiographs 03/03/2016 FINDINGS: Mild straightening of the normal cervical lordosis. 3 mm of anterolisthesis C4 on 5 is unchanged from the comparison study. Favored to be on a degenerative basis given chronicity and spondylitic  changes at this level. No evidence of traumatic listhesis. No abnormally widened, perched or jumped facets. Normal alignment of the craniocervical and atlantoaxial articulations. Multilevel intervertebral disc height loss with spondylitic endplate changes. Multilevel uncinate spurring and facet degenerative changes present as well resulting in some mild to moderate multilevel foraminal narrowing most pronounced on the right at C5-6, C6-7. No prevertebral swelling or gas. Airways patent. No acute abnormality in the upper chest or imaged lung apices. Grid artifact noted incidentally. IMPRESSION: 1. No acute abnormality of the cervical spine. Please note: Spine radiography has limited sensitivity and specificity in the setting of significant trauma. If there is significant mechanism, recommend low threshold for CT imaging. 2. Stable 3 mm of anterolisthesis C4 on 5, likely on a degenerative basis. 3. 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 Electronically Signed   By: Lovena Le M.D.   On: 06/13/2020 16:58   CT Head Wo Contrast  Result Date: 06/13/2020 CLINICAL DATA:  Fall, head injury EXAM: CT HEAD WITHOUT CONTRAST TECHNIQUE: Contiguous axial images were obtained from the base of the skull through the vertex without intravenous contrast. COMPARISON:  None. FINDINGS: Brain: Generalized atrophy with mild ventricular enlargement most likely due to atrophy. Patchy white matter hypodensity bilaterally appears chronic. Negative for acute infarct, hemorrhage,. Dural based calcification left parietal convexity measuring approximately 12 mm in diameter. Possible meningioma. Vascular: Negative for hyperdense vessel Skull: Negative for skull fracture. 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  Electronically Signed   By: Franchot Gallo M.D.  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  Have neurology review for recommendation for f/u.        Relevant Orders   Ambulatory referral to Neurology   Memory change    Found recently to have scalp laceration.  Pt unsure how this occurred.  Discussed at length with her today.  No other injuries noted.  No headache or dizziness.  Laceration healed.  Was evaluated in UC.  CT no acute abnormality.  C-spine - degenerative changes.  Given uncertain of her circumstances surrounding her injury, I discussed neurology referral.  Discussed with her husband as well.  Will hold on further scanning/testing. No syncope or near syncopal episodes with activity.  Will have neurology review.  Have ask her to limit/hold driving until can sort through this issue.  No history of seizures, syncope, etc.  Discussed with her husband.  He reports she has been having increased anxiety since this has occurred.  Will monitor closely.        Relevant Orders   Ambulatory referral to Neurology   Hypercholesteremia    On crestor.  Low cholesterol diet and exercise.  Follow lipid panel and liver function tests.        Anxiety    Increased anxiety with above episode, health issues, etc.  Discussed with her and her husband today.  Follow.  Hold on additional medication until can sort through above.            Einar Pheasant, MD

## 2020-07-08 DIAGNOSIS — Z96652 Presence of left artificial knee joint: Secondary | ICD-10-CM | POA: Diagnosis not present

## 2020-07-08 DIAGNOSIS — M25562 Pain in left knee: Secondary | ICD-10-CM | POA: Diagnosis not present

## 2020-07-09 ENCOUNTER — Ambulatory Visit (INDEPENDENT_AMBULATORY_CARE_PROVIDER_SITE_OTHER): Payer: PPO

## 2020-07-09 VITALS — Ht 64.0 in | Wt 119.0 lb

## 2020-07-09 DIAGNOSIS — Z0000 Encounter for general adult medical examination without abnormal findings: Secondary | ICD-10-CM | POA: Diagnosis not present

## 2020-07-09 NOTE — Progress Notes (Addendum)
Subjective:   Sherry Bernard is a 79 y.o. female who presents for Medicare Annual (Subsequent) preventive examination.  Review of Systems    No ROS.  Medicare Wellness Virtual Visit.   Cardiac Risk Factors include: advanced age (>22men, >51 women)     Objective:    Today's Vitals   07/09/20 0909  Weight: 119 lb (54 kg)  Height: 5\' 4"  (1.626 m)   Body mass index is 20.43 kg/m.  Advanced Directives 07/09/2020 06/13/2020 07/07/2019 05/04/2018 05/03/2017 01/20/2016  Does Patient Have a Medical Advance Directive? Yes No Yes Yes Yes No  Type of Paramedic of Sherry Bernard;Living will - Sherry Bernard;Living will Sherry Bernard;Living will Living will -  Does patient want to make changes to medical advance directive? No - Patient declined - No - Patient declined No - Patient declined No - Patient declined -  Copy of Sherry Bernard in Chart? No - copy requested - No - copy requested No - copy requested - -  Would patient like information on creating a medical advance directive? - - - - - Yes - Educational materials given    Current Medications (verified) Outpatient Encounter Medications as of 07/09/2020  Medication Sig  . aspirin EC 81 MG tablet Take 81 mg by mouth daily. Swallow whole.  Marland Kitchen BIOTIN PO Take by mouth.   . calcitonin, salmon, (MIACALCIN) 200 UNIT/ACT nasal spray Place 1 spray into alternate nostrils daily.  . Calcium Carbonate-Simethicone 1000-60 MG CHEW Chew by mouth.  . Calcium-Phosphorus-Vitamin D (CITRACAL +D3 PO) Take 2 tablets by mouth daily.  Marland Kitchen levothyroxine (SYNTHROID) 88 MCG tablet TAKE 1 TABLET EVERY DAY ON EMPTY STOMACHWITH A GLASS OF WATER AT LEAST 30-60 MINBEFORE BREAKFAST  . Lifitegrast (XIIDRA) 5 % SOLN   . LOTEMAX SM 0.38 % GEL Place 1 drop into the right eye 4 (four) times daily.  . Multiple Vitamins-Minerals (CENTRUM SILVER 50+WOMEN) TABS Take 1 tablet by mouth daily.  Marland Kitchen ofloxacin (OCUFLOX) 0.3 %  ophthalmic solution Place 1 drop into the right eye 4 (four) times daily.  . Omega-3 Fatty Acids (FISH OIL PO) Take by mouth.  . rosuvastatin (CRESTOR) 10 MG tablet TAKE 1 TABLET BY MOUTH DAILY   No facility-administered encounter medications on file as of 07/09/2020.    Allergies (verified) Patient has no known allergies.   History: Past Medical History:  Diagnosis Date  . Colon polyp   . Hyperthyroidism    Past Surgical History:  Procedure Laterality Date  . APPENDECTOMY     child  . COLONOSCOPY WITH PROPOFOL N/A 06/26/2019   Procedure: COLONOSCOPY WITH PROPOFOL;  Surgeon: Lollie Sails, MD;  Location: [facility] ENDOSCOPY;  Service: Endoscopy;  Laterality: N/A;  . TONSILECTOMY, ADENOIDECTOMY, BILATERAL MYRINGOTOMY AND TUBES     child   Family History  Problem Relation Age of Onset  . Breast cancer Mother    Social History   Socioeconomic History  . Marital status: Married    Spouse name: Not on file  . Number of children: 3  . Years of education: Not on file  . Highest education level: Not on file  Occupational History  . Occupation: retired  Tobacco Use  . Smoking status: Never Smoker  . Smokeless tobacco: Never Used  Vaping Use  . Vaping Use: Never used  Substance and Sexual Activity  . Alcohol use: Yes    Alcohol/week: 2.0 - 3.0 standard drinks    Types: 2 - 3 Glasses  of wine per week    Comment: socially  . Drug use: No  . Sexual activity: Yes  Other Topics Concern  . Not on file  Social History Narrative  . Not on file   Social Determinants of Health   Financial Resource Strain: Low Risk   . Difficulty of Paying Living Expenses: Not hard at all  Food Insecurity: No Food Insecurity  . Worried About Charity fundraiser in the Last Year: Never true  . Ran Out of Food in the Last Year: Never true  Transportation Needs: No Transportation Needs  . Lack of Transportation (Medical): No  . Lack of Transportation (Non-Medical): No  Physical Activity:   .  Days of Exercise per Week: Not on file  . Minutes of Exercise per Session: Not on file  Stress: No Stress Concern Present  . Feeling of Stress : Not at all  Social Connections: Unknown  . Frequency of Communication with Friends and Family: More than three times a week  . Frequency of Social Gatherings with Friends and Family: More than three times a week  . Attends Religious Services: Not on file  . Active Member of Clubs or Organizations: Yes  . Attends Archivist Meetings: Not on file  . Marital Status: Married    Tobacco Counseling Counseling given: Not Answered   Clinical Intake:  Pre-visit preparation completed: Yes        Diabetes: No  How often do you need to have someone help you when you read instructions, pamphlets, or other written materials from your doctor or pharmacy?: 1 - Never   Interpreter Needed?: No      Activities of Daily Living In your present state of health, do you have any difficulty performing the following activities: 07/09/2020  Hearing? N  Vision? N  Difficulty concentrating or making decisions? N  Walking or climbing stairs? N  Dressing or bathing? N  Doing errands, shopping? Y  Comment Currently does not drive  Preparing Food and eating ? N  Using the Toilet? N  In the past six months, have you accidently leaked urine? N  Do you have problems with loss of bowel control? N  Managing your Medications? N  Managing your Finances? N  Housekeeping or managing your Housekeeping? N  Some recent data might be hidden   Patient Care Team: Einar Pheasant, MD as PCP - General (Internal Medicine)  Indicate any recent Medical Services you may have received from other than [facility] providers in the past year (date may be approximate).     Assessment:   This is a routine wellness examination for Sherry Bernard.  I connected with Sherry Bernard today by telephone and verified that I am speaking with the correct person using two identifiers. Location  patient: home Location provider: work Persons participating in the virtual visit: patient, Marine scientist.    I discussed the limitations, risks, security and privacy concerns of performing an evaluation and management service by telephone and the availability of in person appointments. The patient expressed understanding and verbally consented to this telephonic visit.    Interactive audio and video telecommunications were attempted between this provider and patient, however failed, due to patient having technical difficulties OR patient did not have access to video capability.  We continued and completed visit with audio only.  Some vital signs may be absent or patient reported.   Hearing/Vision screen  Hearing Screening   125Hz  250Hz  500Hz  1000Hz  2000Hz  3000Hz  4000Hz  6000Hz  8000Hz   Right ear:  Left ear:           Comments: Patient is able to hear conversational tones without difficulty. No issues reported.  Vision Screening Comments: Visual acuity not assessed, virtual visit. They have seen their ophthalmologist in the last 12 months.   Dietary issues and exercise activities discussed: Current Exercise Habits: Home exercise routine, Time (Minutes): 60, Frequency (Times/Week): 2, Weekly Exercise (Minutes/Week): 120, Intensity: Mild Healthy diet  Good water intake Goals      Patient Stated   .  Healthy Lifestyle (pt-stated)      Stay active      Other   .  Eat Less junk food    .  Increase water intake      Depression Screen PHQ 2/9 Scores 07/09/2020 07/07/2019 05/04/2018 05/03/2017 09/30/2016  PHQ - 2 Score 0 0 0 0 0  PHQ- 9 Score - - - - 1    Fall Risk Fall Risk  07/09/2020 07/07/2019 05/04/2018 05/03/2017 09/30/2016  Falls in the past year? 0 0 No No No  Number falls in past yr: 0 - - - -  Follow up Falls evaluation completed - - - -   Handrails in use when climbing stairs? Yes Home free of loose throw rugs in walkways, pet beds, electrical cords, etc? Yes  Adequate  lighting in your home to reduce risk of falls? Yes   ASSISTIVE DEVICES UTILIZED TO PREVENT FALLS: Life alert? No  Use of a cane, walker or w/c? No   TIMED UP AND GO: Was the test performed? No . Virtual visit.   Cognitive Function: MMSE - Mini Mental State Exam 05/03/2017  Orientation to time 5  Orientation to Place 5  Registration 3  Attention/ Calculation 5  Recall 3  Language- name 2 objects 2  Language- repeat 1  Language- follow 3 step command 3  Language- read & follow direction 1  Write a sentence 1  Copy design 1  Total score 30     6CIT Screen 07/09/2020 07/07/2019 05/04/2018  What Year? 0 points 0 points 0 points  What month? 0 points 0 points 0 points  What time? - 0 points 0 points  Count back from 20 - 0 points 0 points  Months in reverse 0 points 0 points 0 points  Repeat phrase - 0 points 0 points  Total Score - 0 0   Immunizations Immunization History  Administered Date(s) Administered  . Fluad Quad(high Dose 65+) 07/10/2019  . Influenza-Unspecified 07/22/2015, 07/11/2016, 07/06/2017, 07/15/2018  . PFIZER SARS-COV-2 Vaccination 10/17/2019, 11/07/2019  . Pneumococcal Conjugate-13 08/04/2013, 07/22/2015  . Pneumococcal Polysaccharide-23 05/03/2017  . Tdap 08/10/2014, 10/09/2014  . Zoster Recombinat (Shingrix) 01/05/2018, 04/11/2018   Health Maintenance Health Maintenance  Topic Date Due  . Hepatitis C Screening  Never done  . INFLUENZA VACCINE  08/26/2020 (Originally 05/12/2020)  . COLONOSCOPY  06/25/2022  . TETANUS/TDAP  10/09/2024  . DEXA SCAN  Completed  . COVID-19 Vaccine  Completed  . PNA vac Low Risk Adult  Completed   Dental Screening: Recommended annual dental exams for proper oral hygiene. Visit every 6 months.   Community Resource Referral / Chronic Care Management: CRR required this visit?  No   CCM required this visit?  No      Plan:   Keep all routine maintenance appointments.   Follow up 07/12/20 @ 1:00 for R hand, middle finger  pain w/ Denice Paradise, NP.   Follow up 08/20/20 @ 11:00, Dr. Nicki Reaper  I have personally  reviewed and noted the following in the patient's chart:   . Medical and social history . Use of alcohol, tobacco or illicit drugs  . Current medications and supplements . Functional ability and status . Nutritional status . Physical activity . Advanced directives . List of other physicians . Hospitalizations, surgeries, and ER visits in previous 12 months . Vitals . Screenings to include cognitive, depression, and falls . Referrals and appointments  In addition, I have reviewed and discussed with patient certain preventive protocols, quality metrics, and best practice recommendations. A written personalized care plan for preventive services as well as general preventive health recommendations were provided to patient via mychart.     Varney Biles, LPN   [id number]     Reviewed above information.  Agree with assessment and plan.   Dr Nicki Reaper

## 2020-07-09 NOTE — Patient Instructions (Addendum)
Sherry Bernard , Thank you for taking time to come for your Medicare Wellness Visit. I appreciate your ongoing commitment to your health goals. Please review the following plan we discussed and let me know if I can assist you in the future.   These are the goals we discussed: Goals      Patient Stated     Healthy Lifestyle (pt-stated)      Stay active      Other     Eat Less junk food      Increase water intake       This is a list of the screening recommended for you and due dates:  Health Maintenance  Topic Date Due    Hepatitis C: One time screening is recommended by Center for Disease Control  (CDC) for  adults born from [birth year] through 1964.   Never done   Flu Shot  08/17/2020*   Colon Cancer Screening  06/25/2022   Tetanus Vaccine  10/09/2024   DEXA scan (bone density measurement)  Completed   COVID-19 Vaccine  Completed   Pneumonia vaccines  Completed  *Topic was postponed. The date shown is not the original due date.    Immunizations Immunization History  Administered Date(s) Administered   Fluad Quad(high Dose 65+) 07/10/2019   Influenza-Unspecified 07/22/2015, 07/11/2016, 07/06/2017, 07/15/2018   PFIZER SARS-COV-2 Vaccination 10/17/2019, 11/07/2019   Pneumococcal Conjugate-13 08/04/2013, 07/22/2015   Pneumococcal Polysaccharide-23 05/03/2017   Tdap 08/10/2014, 10/09/2014   Zoster Recombinat (Shingrix) 01/05/2018, 04/11/2018   Keep all routine maintenance appointments.   Follow up 07/12/20 @ 1:00 for R hand, middle finger pain w/ Denice Paradise, NP.   Follow up 08/20/20 @ 11:00, Dr. Nicki Reaper  Advanced directives: End of life planning; Advance aging; Advanced directives discussed.  Copy of current HCPOA/Living Will requested.    Follow up in one year for your annual wellness visit.   Preventive Care 78 Years and Older, Female Preventive care refers to lifestyle choices and visits with your health care provider that can promote health and  wellness. What does preventive care include?  A yearly physical exam. This is also called an annual well check.  Dental exams once or twice a year.  Routine eye exams. Ask your health care provider how often you should have your eyes checked.  Personal lifestyle choices, including:  Daily care of your teeth and gums.  Regular physical activity.  Eating a healthy diet.  Avoiding tobacco and drug use.  Limiting alcohol use.  Practicing safe sex.  Taking low-dose aspirin every day.  Taking vitamin and mineral supplements as recommended by your health care provider. What happens during an annual well check? The services and screenings done by your health care provider during your annual well check will depend on your age, overall health, lifestyle risk factors, and family history of disease. Counseling  Your health care provider may ask you questions about your:  Alcohol use.  Tobacco use.  Drug use.  Emotional well-being.  Home and relationship well-being.  Sexual activity.  Eating habits.  History of falls.  Memory and ability to understand (cognition).  Work and work Statistician.  Reproductive health. Screening  You may have the following tests or measurements:  Height, weight, and BMI.  Blood pressure.  Lipid and cholesterol levels. These may be checked every 5 years, or more frequently if you are over 83 years old.  Skin check.  Lung cancer screening. You may have this screening every year starting at age 26 if you  have a 30-pack-year history of smoking and currently smoke or have quit within the past 15 years.  Fecal occult blood test (FOBT) of the stool. You may have this test every year starting at age 46.  Flexible sigmoidoscopy or colonoscopy. You may have a sigmoidoscopy every 5 years or a colonoscopy every 10 years starting at age 35.  Hepatitis C blood test.  Hepatitis B blood test.  Sexually transmitted disease (STD)  testing.  Diabetes screening. This is done by checking your blood sugar (glucose) after you have not eaten for a while (fasting). You may have this done every 1-3 years.  Bone density scan. This is done to screen for osteoporosis. You may have this done starting at age 39.  Mammogram. This may be done every 1-2 years. Talk to your health care provider about how often you should have regular mammograms. Talk with your health care provider about your test results, treatment options, and if necessary, the need for more tests. Vaccines  Your health care provider may recommend certain vaccines, such as:  Influenza vaccine. This is recommended every year.  Tetanus, diphtheria, and acellular pertussis (Tdap, Td) vaccine. You may need a Td booster every 10 years.  Zoster vaccine. You may need this after age 47.  Pneumococcal 13-valent conjugate (PCV13) vaccine. One dose is recommended after age 82.  Pneumococcal polysaccharide (PPSV23) vaccine. One dose is recommended after age 60. Talk to your health care provider about which screenings and vaccines you need and how often you need them. This information is not intended to replace advice given to you by your health care provider. Make sure you discuss any questions you have with your health care provider. Document Released: 10/25/2015 Document Revised: 06/17/2016 Document Reviewed: 07/30/2015 Elsevier Interactive Patient Education  2017 [place] Prevention in the Home Falls can cause injuries. They can happen to people of all ages. There are many things you can do to make your home safe and to help prevent falls. What can I do on the outside of my home?  Regularly fix the edges of walkways and driveways and fix any cracks.  Remove anything that might make you trip as you walk through a door, such as a raised step or threshold.  Trim any bushes or trees on the path to your home.  Use bright outdoor lighting.  Clear any walking  paths of anything that might make someone trip, such as rocks or tools.  Regularly check to see if handrails are loose or broken. Make sure that both sides of any steps have handrails.  Any raised decks and porches should have guardrails on the edges.  Have any leaves, snow, or ice cleared regularly.  Use sand or salt on walking paths during winter.  Clean up any spills in your garage right away. This includes oil or grease spills. What can I do in the bathroom?  Use night lights.  Install grab bars by the toilet and in the tub and shower. Do not use towel bars as grab bars.  Use non-skid mats or decals in the tub or shower.  If you need to sit down in the shower, use a plastic, non-slip stool.  Keep the floor dry. Clean up any water that spills on the floor as soon as it happens.  Remove soap buildup in the tub or shower regularly.  Attach bath mats securely with double-sided non-slip rug tape.  Do not have throw rugs and other things on the floor that can  make you trip. What can I do in the bedroom?  Use night lights.  Make sure that you have a light by your bed that is easy to reach.  Do not use any sheets or blankets that are too big for your bed. They should not hang down onto the floor.  Have a firm chair that has side arms. You can use this for support while you get dressed.  Do not have throw rugs and other things on the floor that can make you trip. What can I do in the kitchen?  Clean up any spills right away.  Avoid walking on wet floors.  Keep items that you use a lot in easy-to-reach places.  If you need to reach something above you, use a strong step stool that has a grab bar.  Keep electrical cords out of the way.  Do not use floor polish or wax that makes floors slippery. If you must use wax, use non-skid floor wax.  Do not have throw rugs and other things on the floor that can make you trip. What can I do with my stairs?  Do not leave any items  on the stairs.  Make sure that there are handrails on both sides of the stairs and use them. Fix handrails that are broken or loose. Make sure that handrails are as long as the stairways.  Check any carpeting to make sure that it is firmly attached to the stairs. Fix any carpet that is loose or worn.  Avoid having throw rugs at the top or bottom of the stairs. If you do have throw rugs, attach them to the floor with carpet tape.  Make sure that you have a light switch at the top of the stairs and the bottom of the stairs. If you do not have them, ask someone to add them for you. What else can I do to help prevent falls?  Wear shoes that:  Do not have high heels.  Have rubber bottoms.  Are comfortable and fit you well.  Are closed at the toe. Do not wear sandals.  If you use a stepladder:  Make sure that it is fully opened. Do not climb a closed stepladder.  Make sure that both sides of the stepladder are locked into place.  Ask someone to hold it for you, if possible.  Clearly mark and make sure that you can see:  Any grab bars or handrails.  First and last steps.  Where the edge of each step is.  Use tools that help you move around (mobility aids) if they are needed. These include:  Canes.  Walkers.  Scooters.  Crutches.  Turn on the lights when you go into a dark area. Replace any light bulbs as soon as they burn out.  Set up your furniture so you have a clear path. Avoid moving your furniture around.  If any of your floors are uneven, fix them.  If there are any pets around you, be aware of where they are.  Review your medicines with your doctor. Some medicines can make you feel dizzy. This can increase your chance of falling. Ask your doctor what other things that you can do to help prevent falls. This information is not intended to replace advice given to you by your health care provider. Make sure you discuss any questions you have with your health care  provider. Document Released: 07/25/2009 Document Revised: 03/05/2016 Document Reviewed: 11/02/2014 Elsevier Interactive Patient Education  2017 [employer].

## 2020-07-11 DIAGNOSIS — M25562 Pain in left knee: Secondary | ICD-10-CM | POA: Diagnosis not present

## 2020-07-11 DIAGNOSIS — Z96652 Presence of left artificial knee joint: Secondary | ICD-10-CM | POA: Diagnosis not present

## 2020-07-12 ENCOUNTER — Ambulatory Visit: Payer: PPO | Admitting: Nurse Practitioner

## 2020-07-12 ENCOUNTER — Encounter: Payer: Self-pay | Admitting: Internal Medicine

## 2020-07-12 ENCOUNTER — Ambulatory Visit
Admission: EM | Admit: 2020-07-12 | Discharge: 2020-07-12 | Disposition: A | Discharge status: Home / Self Care | Payer: PPO | Attending: Family Medicine | Admitting: Family Medicine

## 2020-07-12 ENCOUNTER — Ambulatory Visit (INDEPENDENT_AMBULATORY_CARE_PROVIDER_SITE_OTHER): Payer: PPO | Admitting: *Deleted

## 2020-07-12 ENCOUNTER — Other Ambulatory Visit: Payer: Self-pay

## 2020-07-12 DIAGNOSIS — B351 Tinea unguium: Secondary | ICD-10-CM

## 2020-07-12 DIAGNOSIS — L03011 Cellulitis of right finger: Secondary | ICD-10-CM | POA: Diagnosis not present

## 2020-07-12 DIAGNOSIS — M79644 Pain in right finger(s): Secondary | ICD-10-CM

## 2020-07-12 DIAGNOSIS — D329 Benign neoplasm of meninges, unspecified: Secondary | ICD-10-CM | POA: Insufficient documentation

## 2020-07-12 DIAGNOSIS — L603 Nail dystrophy: Secondary | ICD-10-CM

## 2020-07-12 DIAGNOSIS — R413 Other amnesia: Secondary | ICD-10-CM | POA: Insufficient documentation

## 2020-07-12 MED ORDER — CEPHALEXIN 500 MG PO CAPS: 500.0000 mg | ORAL_CAPSULE | Freq: Two times a day (BID) | ORAL | 0 refills | Status: AC

## 2020-07-12 NOTE — Discharge Instructions (Addendum)
I have sent in Keflex for you to take twice a day for 7 days to help your finger heal  Follow-up with this office or with primary care if symptoms are persisting

## 2020-07-12 NOTE — Assessment & Plan Note (Signed)
Incidentally found on CT.  Have neurology review for recommendation for f/u.

## 2020-07-12 NOTE — Assessment & Plan Note (Signed)
On crestor.  Low cholesterol diet and exercise.  Follow lipid panel and liver function tests.   

## 2020-07-12 NOTE — ED Triage Notes (Signed)
Pt c/o right middle finger possible infection around the nailbed. Pt states this began about a week ago. She has noticed some pus drainage. Area is red and inflamed.

## 2020-07-12 NOTE — Assessment & Plan Note (Signed)
Increased anxiety with above episode, health issues, etc.  Discussed with her and her husband today.  Follow.  Hold on additional medication until can sort through above.

## 2020-07-12 NOTE — Assessment & Plan Note (Addendum)
Found recently to have scalp laceration.  Pt unsure how this occurred.  Discussed at length with her today.  No other injuries noted.  No headache or dizziness.  Laceration healed.  Was evaluated in UC.  CT no acute abnormality.  C-spine - degenerative changes.  Given uncertain of her circumstances surrounding her injury, I discussed neurology referral.  Discussed with her husband as well.  Will hold on further scanning/testing. No syncope or near syncopal episodes with activity.  Will have neurology review.  Have ask her to limit/hold driving until can sort through this issue.  No history of seizures, syncope, etc.  Discussed with her husband.  He reports she has been having increased anxiety since this has occurred.  Will monitor closely.

## 2020-07-12 NOTE — ED Provider Notes (Signed)
[place]   [id number] 07/12/20 Arrival Time: 18  CC: RASH  SUBJECTIVE:  Sherry Bernard is a 79 y.o. female who presents with a skin complaint that began about a week ago.  Reports that she thinks that she might have an infection around the right middle finger.  Reports that the area is red and swollen.  Reports that there has been some white pus draining from the area.  Does not recall injury.  Has not taken OTC medication for this.  Denies changes in soaps, detergents, close contacts with similar rash, known trigger or environmental trigger, allergy. Denies medications change or starting a new medication recently. There are no aggravating or alleviating factors. Denies similar symptoms in the past.  Denies fever, chills, nausea, vomiting, swelling, discharge, oral lesions, SOB, chest pain, abdominal pain, changes in bowel or bladder function.    ROS: As per HPI.  All other pertinent ROS negative.     Past Medical History:  Diagnosis Date   Colon polyp    Hyperthyroidism    Past Surgical History:  Procedure Laterality Date   APPENDECTOMY     child   COLONOSCOPY WITH PROPOFOL N/A 06/26/2019   Procedure: COLONOSCOPY WITH PROPOFOL;  Surgeon: Lollie Sails, MD;  Location: [facility] ENDOSCOPY;  Service: Endoscopy;  Laterality: N/A;   TONSILECTOMY, ADENOIDECTOMY, BILATERAL MYRINGOTOMY AND TUBES     child   No Known Allergies No current facility-administered medications on file prior to encounter.   Current Outpatient Medications on File Prior to Encounter  Medication Sig Dispense Refill   aspirin EC 81 MG tablet Take 81 mg by mouth daily. Swallow whole.     BIOTIN PO Take by mouth.      calcitonin, salmon, (MIACALCIN) 200 UNIT/ACT nasal spray Place 1 spray into alternate nostrils daily. 3.7 mL 12   Calcium Carbonate-Simethicone 1000-60 MG CHEW Chew by mouth.     Calcium-Phosphorus-Vitamin D (CITRACAL +D3 PO) Take 2 tablets by mouth daily.     levothyroxine  (SYNTHROID) 88 MCG tablet TAKE 1 TABLET EVERY DAY ON EMPTY STOMACHWITH A GLASS OF WATER AT LEAST 30-60 MINBEFORE BREAKFAST 90 tablet 1   Lifitegrast (XIIDRA) 5 % SOLN      LOTEMAX SM 0.38 % GEL Place 1 drop into the right eye 4 (four) times daily.     Multiple Vitamins-Minerals (CENTRUM SILVER 50+WOMEN) TABS Take 1 tablet by mouth daily.     ofloxacin (OCUFLOX) 0.3 % ophthalmic solution Place 1 drop into the right eye 4 (four) times daily.     Omega-3 Fatty Acids (FISH OIL PO) Take by mouth.     rosuvastatin (CRESTOR) 10 MG tablet TAKE 1 TABLET BY MOUTH DAILY 90 tablet 1   Social History   Socioeconomic History   Marital status: Married    Spouse name: Not on file   Number of children: 3   Years of education: Not on file   Highest education level: Not on file  Occupational History   Occupation: retired  Tobacco Use   Smoking status: Never Smoker   Smokeless tobacco: Never Used  Scientific laboratory technician Use: Never used  Substance and Sexual Activity   Alcohol use: Yes    Alcohol/week: 2.0 - 3.0 standard drinks    Types: 2 - 3 Glasses of wine per week    Comment: socially   Drug use: No   Sexual activity: Yes  Other Topics Concern   Not on file  Social History Narrative   Not  on file   Social Determinants of Health   Financial Resource Strain: Low Risk    Difficulty of Paying Living Expenses: Not hard at all  Food Insecurity: No Food Insecurity   Worried About Charity fundraiser in the Last Year: Never true   Arboriculturist in the Last Year: Never true  Transportation Needs: No Transportation Needs   Lack of Transportation (Medical): No   Lack of Transportation (Non-Medical): No  Physical Activity:    Days of Exercise per Week: Not on file   Minutes of Exercise per Session: Not on file  Stress: No Stress Concern Present   Feeling of Stress : Not at all  Social Connections: Unknown   Frequency of Communication with Friends and Family: More than  three times a week   Frequency of Social Gatherings with Friends and Family: More than three times a week   Attends Religious Services: Not on Electrical engineer or Organizations: Yes   Attends Archivist Meetings: Not on file   Marital Status: Married  Human resources officer Violence: Not At Risk   Fear of Current or Ex-Partner: No   Emotionally Abused: No   Physically Abused: No   Sexually Abused: No   Family History  Problem Relation Age of Onset   Breast cancer Mother     OBJECTIVE: Vitals:   07/12/20 1523  BP: (!) 146/76  Pulse: 68  Temp: 97.8 F (36.6 C)  TempSrc: Oral  SpO2: 96%    General appearance: alert; no distress Head: NCAT Lungs: clear to auscultation bilaterally Heart: regular rate and rhythm.  Radial pulse 2+ bilaterally Extremities: no edema Skin: warm and dry; paronychia to medial aspect of right middle finger Psychological: alert and cooperative; normal mood and affect  ASSESSMENT & PLAN:  1. Paronychia of right middle finger   2. Finger pain, right     Meds ordered this encounter  Medications   cephALEXin (KEFLEX) 500 MG capsule    Sig: Take 1 capsule (500 mg total) by mouth 2 (two) times daily for 7 days.    Dispense:  14 capsule    Refill:  0    Order Specific Question:   Supervising Provider    Answer:   Chase Picket [[id number]]    Paronychia Keflex BID x 7 days Take as prescribed and to completion Avoid hot showers/ baths Moisturize skin daily  Follow up with PCP if symptoms persists Return or go to the ER if you have any new or worsening symptoms such as fever, chills, nausea, vomiting, redness, swelling, discharge, if symptoms do not improve with medications  Reviewed expectations re: course of current medical issues. Questions answered. Outlined signs and symptoms indicating need for more acute intervention. Patient verbalized understanding. After Visit Summary given.   Faustino Congress,  NP 07/12/20 1557

## 2020-07-12 NOTE — Progress Notes (Signed)
Patient presents today for the 6th laser treatment. Diagnosed with mycotic nail infection by Dr. Milinda Pointer.   Toenail most affected the hallux right. Her nail is almost clear. She is very happy with her progress.  All other systems are negative.  Nails were filed thin. Laser therapy was administered to 1st toenails right and patient tolerated the treatment well. All safety precautions were in place.    Patient has completed the recommended laser treatments. He will follow up with Dr. Milinda Pointer in 3 months to evaluate progress.    ~Final pic of nail take today~

## 2020-07-15 DIAGNOSIS — Z96652 Presence of left artificial knee joint: Secondary | ICD-10-CM | POA: Diagnosis not present

## 2020-07-15 DIAGNOSIS — M25562 Pain in left knee: Secondary | ICD-10-CM | POA: Diagnosis not present

## 2020-07-16 ENCOUNTER — Telehealth: Payer: Self-pay | Admitting: Internal Medicine

## 2020-07-16 NOTE — Telephone Encounter (Signed)
Pt husband called and wanted to go over pt medical info

## 2020-07-16 NOTE — Telephone Encounter (Signed)
Called and left message for husband.

## 2020-07-16 NOTE — Telephone Encounter (Signed)
Husband was calling to let Dr Nicki Reaper know that his wife was emotional during last appt and advised that she did not really fall or black out. He reached out and scheduled her an appointment with someone at [facility] to do a neurological eval and set up an MRI. Advised that he would prefer to discuss further with Dr Nicki Reaper directly if she had time.Advised that Dr Nicki Reaper is out of the office today. Husband stated that is ok.

## 2020-07-17 NOTE — Telephone Encounter (Signed)
Husband called you back.

## 2020-07-17 NOTE — Telephone Encounter (Signed)
I just called and spoke to him.  Questions answered.

## 2020-07-17 NOTE — Telephone Encounter (Signed)
Pt husband called back returning Dr. Nicki Reaper phone call  Please call [phone] husband #

## 2020-07-18 DIAGNOSIS — C4441 Basal cell carcinoma of skin of scalp and neck: Secondary | ICD-10-CM | POA: Diagnosis not present

## 2020-07-18 DIAGNOSIS — L988 Other specified disorders of the skin and subcutaneous tissue: Secondary | ICD-10-CM | POA: Diagnosis not present

## 2020-07-18 DIAGNOSIS — L814 Other melanin hyperpigmentation: Secondary | ICD-10-CM | POA: Diagnosis not present

## 2020-07-18 DIAGNOSIS — L578 Other skin changes due to chronic exposure to nonionizing radiation: Secondary | ICD-10-CM | POA: Diagnosis not present

## 2020-07-22 DIAGNOSIS — Z96652 Presence of left artificial knee joint: Secondary | ICD-10-CM | POA: Diagnosis not present

## 2020-07-22 DIAGNOSIS — M25562 Pain in left knee: Secondary | ICD-10-CM | POA: Diagnosis not present

## 2020-07-23 ENCOUNTER — Encounter: Payer: PPO | Admitting: Plastic Surgery

## 2020-07-25 ENCOUNTER — Ambulatory Visit
Admission: EM | Admit: 2020-07-25 | Discharge: 2020-07-25 | Disposition: A | Discharge status: Home / Self Care | Payer: PPO | Attending: Family Medicine | Admitting: Family Medicine

## 2020-07-25 ENCOUNTER — Ambulatory Visit (INDEPENDENT_AMBULATORY_CARE_PROVIDER_SITE_OTHER): Payer: PPO

## 2020-07-25 ENCOUNTER — Other Ambulatory Visit: Payer: Self-pay

## 2020-07-25 DIAGNOSIS — M545 Low back pain, unspecified: Secondary | ICD-10-CM

## 2020-07-25 DIAGNOSIS — R079 Chest pain, unspecified: Secondary | ICD-10-CM

## 2020-07-25 DIAGNOSIS — I700 Atherosclerosis of aorta: Secondary | ICD-10-CM | POA: Diagnosis not present

## 2020-07-25 DIAGNOSIS — R0781 Pleurodynia: Secondary | ICD-10-CM

## 2020-07-25 DIAGNOSIS — W19XXXA Unspecified fall, initial encounter: Secondary | ICD-10-CM | POA: Diagnosis not present

## 2020-07-25 DIAGNOSIS — M419 Scoliosis, unspecified: Secondary | ICD-10-CM | POA: Diagnosis not present

## 2020-07-25 MED ORDER — TRAMADOL HCL 50 MG PO TABS: 50.0000 mg | ORAL_TABLET | Freq: Two times a day (BID) | ORAL | 0 refills | Status: AC | PRN

## 2020-07-25 NOTE — ED Triage Notes (Signed)
Pt states she was hugging someone and they fell over onto her and then they both fell. Pt fell onto the sofa. Hurts on both sides of rib cage and in mid and low back.  Occurred 7-10 days ago

## 2020-07-25 NOTE — Discharge Instructions (Signed)
Rest  Medication as prescribed.  Take care  Dr. Josimar Corning  

## 2020-07-25 NOTE — ED Provider Notes (Signed)
[facility]    CSN: [id number] Arrival date & time: 07/25/20  1054      History   Chief Complaint Chief Complaint  Patient presents with  . Fall   HPI  79 year old female presents with the above complaint.  Injury occurred approximately 1.5 weeks ago.  She states that she was hugging someone.  The person she was hugging suddenly lost balance and pushed her forward and made her fall.  She states that she believes that she hit a table.  She is experiencing bilateral rib pain as well as low back pain.  She states that it has not improved at home.  She reports some pain when she takes a deep breath.  No reported bruising.  No head injury.  Pain currently 6/10 in severity.  No other complaints.  Past Medical History:  Diagnosis Date  . Colon polyp   . Hyperthyroidism     Patient Active Problem List   Diagnosis Date Noted  . Memory change 07/12/2020  . Meningioma ([place]) 07/12/2020  . Left ankle pain 04/07/2020  . Hair loss 01/08/2020  . Pre-op examination 01/08/2020  . Hypercholesteremia 01/08/2020  . Left leg swelling 11/15/2019  . Finger infection 03/05/2019  . Skin infection 01/04/2019  . URI (upper respiratory infection) 01/04/2019  . Healthcare maintenance 12/05/2018  . Left shoulder pain 03/12/2018  . Anxiety 08/18/2017  . Hematuria 08/16/2017  . Low back pain 01/10/2017  . Encounter for counseling 11/06/2016  . Cough 10/06/2016  . Diarrhea 10/06/2016  . Fibrocystic breast disease 05/05/2016  . Osteoporosis 05/05/2016  . Cyst of pancreas 08/13/2015  . 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 Acquired hypothyroidism 08/02/2014  . Rhytides 11/20/2013  . Postmenopausal 10/13/1991    Past Surgical History:  Procedure Laterality Date  . APPENDECTOMY     child  . COLONOSCOPY WITH PROPOFOL N/A 06/26/2019   Procedure: COLONOSCOPY WITH PROPOFOL;  Surgeon: Lollie Sails, MD;  Location: [facility] ENDOSCOPY;  Service: Endoscopy;  Laterality: N/A;  .  TONSILECTOMY, ADENOIDECTOMY, BILATERAL MYRINGOTOMY AND TUBES     child    OB History   No obstetric history on file.      Home Medications    Prior to Admission medications   Medication Sig Start Date End Date Taking? Authorizing Provider  aspirin EC 81 MG tablet Take 81 mg by mouth daily. Swallow whole.    [provider]  BIOTIN PO Take by mouth.     [provider]  calcitonin, salmon, (MIACALCIN) 200 UNIT/ACT nasal spray Place 1 spray into alternate nostrils daily. 05/14/20   Einar Pheasant, MD  Calcium Carbonate-Simethicone 1000-60 MG CHEW Chew by mouth.    [provider]  Calcium-Phosphorus-Vitamin D (CITRACAL +D3 PO) Take 2 tablets by mouth daily.    [provider]  levothyroxine (SYNTHROID) 88 MCG tablet TAKE 1 TABLET EVERY DAY ON EMPTY STOMACHWITH A GLASS OF WATER AT LEAST 30-60 MINBEFORE BREAKFAST 04/16/20   Einar Pheasant, MD  LOTEMAX SM 0.38 % GEL Place 1 drop into the right eye 4 (four) times daily. 05/11/19   [provider]  Multiple Vitamins-Minerals (CENTRUM SILVER 50+WOMEN) TABS Take 1 tablet by mouth daily.    [provider]  ofloxacin (OCUFLOX) 0.3 % ophthalmic solution Place 1 drop into the right eye 4 (four) times daily. 06/06/19   [provider]  Omega-3 Fatty Acids (FISH OIL PO) Take by mouth.    [provider]  rosuvastatin (CRESTOR) 10 MG tablet TAKE 1  TABLET BY MOUTH DAILY 04/16/20   Leone Haven, MD  traMADol (ULTRAM) 50 MG tablet Take 1 tablet (50 mg total) by mouth every 12 (twelve) hours as needed for moderate pain or severe pain. 07/25/20   Coral Spikes, DO    Family History Family History  Problem Relation Age of Onset  . Breast cancer Mother     Social History Social History   Tobacco Use  . Smoking status: Never Smoker  . Smokeless tobacco: Never Used  Vaping Use  . Vaping Use: Never used  Substance Use Topics  . Alcohol use: Yes    Alcohol/week: 2.0 - 3.0  standard drinks    Types: 2 - 3 Glasses of wine per week    Comment: socially  . Drug use: No     Allergies   Patient has no known allergies.   Review of Systems Review of Systems  Musculoskeletal: Positive for back pain.       Rib pain.   Physical Exam Triage Vital Signs ED Triage Vitals  Enc Vitals Group     BP 07/25/20 1124 128/73     Pulse Rate 07/25/20 1124 100     Resp 07/25/20 1124 18     Temp 07/25/20 1124 97.8 F (36.6 C)     Temp Source 07/25/20 1124 Oral     SpO2 07/25/20 1124 96 %     Weight 07/25/20 1121 119 lb 0.8 oz (54 kg)     Height 07/25/20 1121 5\' 4"  (1.626 m)     Head Circumference --      Peak Flow --      Pain Score 07/25/20 1120 6     Pain Loc --      Pain Edu? --      Excl. in [place]? --    Updated Vital Signs BP 128/73   Pulse 100   Temp 97.8 F (36.6 C) (Oral)   Resp 18   Ht 5\' 4"  (1.626 m)   Wt 54 kg   SpO2 96%   BMI 20.43 kg/m   Visual Acuity Right Eye Distance:   Left Eye Distance:   Bilateral Distance:    Right Eye Near:   Left Eye Near:    Bilateral Near:     Physical Exam Constitutional:      General: She is not in acute distress.    Appearance: Normal appearance. She is not ill-appearing.  Eyes:     General:        Right eye: No discharge.        Left eye: No discharge.     Conjunctiva/sclera: Conjunctivae normal.  Cardiovascular:     Rate and Rhythm: Normal rate and regular rhythm.  Pulmonary:     Effort: Pulmonary effort is normal.     Breath sounds: Normal breath sounds. No wheezing, rhonchi or rales.  Musculoskeletal:     Comments: Ribs -no discrete areas of tenderness on exam.  Lumbar spine - no discrete tenderness on exam.  Neurological:     Mental Status: She is alert.  Psychiatric:        Mood and Affect: Mood normal.        Behavior: Behavior normal.    UC Treatments / Results  Labs (all labs ordered are listed, but only abnormal results are displayed) Labs Reviewed - No data to  display  EKG   Radiology DG Ribs Bilateral W/Chest  Result Date: 07/25/2020 CLINICAL DATA:  Left chest pain since the  patient fell approximately 1 week ago when she was hugging someone. Initial encounter. EXAM: BILATERAL RIBS AND CHEST - 4+ VIEW COMPARISON:  PA and lateral chest 02/21/2016 FINDINGS: The lungs are clear. Heart size is normal. No pneumothorax or pleural effusion. Aortic atherosclerosis. No fracture or focal bony abnormality. Thoracolumbar scoliosis noted. IMPRESSION: No acute bony abnormality.  No acute disease. Scoliosis. Aortic Atherosclerosis (ICD10-I70.0). Electronically Signed   By: Inge Rise M.D.   On: 07/25/2020 12:22   DG Lumbar Spine Complete  Result Date: 07/25/2020 CLINICAL DATA:  Pain following fall EXAM: LUMBAR SPINE - COMPLETE 4+ VIEW COMPARISON:  July 10, 2019 FINDINGS: Frontal, lateral, spot lumbosacral lateral, and bilateral oblique views were obtained. There are 5 non-rib-bearing lumbar type vertebral bodies. T12 ribs are hypoplastic. There is thoracolumbar levoscoliosis with rotatory component. There is a stable fracture of the superior endplate at L1, stable. A similar chronic superior endplate fracture is noted at L5. No acute fracture is evident. There is no appreciable spondylolisthesis. There is moderately severe disc space narrowing at L1-2 and L2-3 with slightly milder disc space narrowing at L3-4 and L4-5. there is facet osteoarthritic change at L4-5 and L5-S1 bilaterally. IMPRESSION: Stable scoliosis. Chronic superior endplate fractures at L1 and L5. No acute fracture. No spondylolisthesis. Multilevel arthropathy noted, similar to prior study. Electronically Signed   By: Lowella Grip III M.D.   On: 07/25/2020 12:53    Procedures Procedures (including critical care time)  Medications Ordered in UC Medications - No data to display  Initial Impression / Assessment and Plan / UC Course  I have reviewed the triage vital signs and the  nursing notes.  Pertinent labs & imaging results that were available during my care of the patient were reviewed by me and considered in my medical decision making (see chart for details).    79 year old female presents with rib pain and back pain after suffering a fall.  X-rays obtained and independently reviewed by me.  Interpretation: No acute fractures of the ribs.  Scoliosis noted.  X-ray lumbar spine revealed degenerative changes, chronic superior endplate fractures of L5 and S1.  Scoliosis again noted.  No acute fractures.  Tramadol as needed for pain.  Supportive care.  Final Clinical Impressions(s) / UC Diagnoses   Final diagnoses:  Fall  Acute low back pain without sciatica, unspecified back pain laterality  Rib pain     Discharge Instructions     Rest.  Medication as prescribed.  Take care  Dr. Lacinda Axon     ED Prescriptions    Medication Sig Dispense Auth. Provider   traMADol (ULTRAM) 50 MG tablet Take 1 tablet (50 mg total) by mouth every 12 (twelve) hours as needed for moderate pain or severe pain. 10 tablet Thersa Salt G, DO     I have reviewed the PDMP during this encounter.   Coral Spikes, DO 07/25/20 1322

## 2020-07-30 DIAGNOSIS — M25562 Pain in left knee: Secondary | ICD-10-CM | POA: Diagnosis not present

## 2020-07-30 DIAGNOSIS — Z96652 Presence of left artificial knee joint: Secondary | ICD-10-CM | POA: Diagnosis not present

## 2020-08-01 DIAGNOSIS — M25562 Pain in left knee: Secondary | ICD-10-CM | POA: Diagnosis not present

## 2020-08-01 DIAGNOSIS — Z96652 Presence of left artificial knee joint: Secondary | ICD-10-CM | POA: Diagnosis not present

## 2020-08-02 DIAGNOSIS — M25462 Effusion, left knee: Secondary | ICD-10-CM | POA: Diagnosis not present

## 2020-08-02 DIAGNOSIS — Z96659 Presence of unspecified artificial knee joint: Secondary | ICD-10-CM | POA: Diagnosis not present

## 2020-08-05 DIAGNOSIS — M545 Low back pain, unspecified: Secondary | ICD-10-CM | POA: Diagnosis not present

## 2020-08-05 DIAGNOSIS — N3000 Acute cystitis without hematuria: Secondary | ICD-10-CM | POA: Diagnosis not present

## 2020-08-06 DIAGNOSIS — S0990XA Unspecified injury of head, initial encounter: Secondary | ICD-10-CM | POA: Diagnosis not present

## 2020-08-06 DIAGNOSIS — K862 Cyst of pancreas: Secondary | ICD-10-CM | POA: Diagnosis not present

## 2020-08-06 DIAGNOSIS — X58XXXA Exposure to other specified factors, initial encounter: Secondary | ICD-10-CM | POA: Diagnosis not present

## 2020-08-06 DIAGNOSIS — R9089 Other abnormal findings on diagnostic imaging of central nervous system: Secondary | ICD-10-CM | POA: Diagnosis not present

## 2020-08-07 DIAGNOSIS — C786 Secondary malignant neoplasm of retroperitoneum and peritoneum: Secondary | ICD-10-CM | POA: Diagnosis not present

## 2020-08-07 DIAGNOSIS — M48061 Spinal stenosis, lumbar region without neurogenic claudication: Secondary | ICD-10-CM | POA: Diagnosis not present

## 2020-08-07 DIAGNOSIS — R160 Hepatomegaly, not elsewhere classified: Secondary | ICD-10-CM | POA: Diagnosis not present

## 2020-08-07 DIAGNOSIS — M47816 Spondylosis without myelopathy or radiculopathy, lumbar region: Secondary | ICD-10-CM | POA: Diagnosis not present

## 2020-08-07 DIAGNOSIS — M419 Scoliosis, unspecified: Secondary | ICD-10-CM | POA: Diagnosis not present

## 2020-08-07 DIAGNOSIS — M5124 Other intervertebral disc displacement, thoracic region: Secondary | ICD-10-CM | POA: Diagnosis not present

## 2020-08-07 DIAGNOSIS — M545 Low back pain, unspecified: Secondary | ICD-10-CM | POA: Diagnosis not present

## 2020-08-07 DIAGNOSIS — K769 Liver disease, unspecified: Secondary | ICD-10-CM | POA: Diagnosis not present

## 2020-08-07 DIAGNOSIS — M47814 Spondylosis without myelopathy or radiculopathy, thoracic region: Secondary | ICD-10-CM | POA: Diagnosis not present

## 2020-08-08 DIAGNOSIS — R160 Hepatomegaly, not elsewhere classified: Secondary | ICD-10-CM | POA: Diagnosis not present

## 2020-08-12 DIAGNOSIS — I959 Hypotension, unspecified: Secondary | ICD-10-CM | POA: Diagnosis not present

## 2020-08-12 DIAGNOSIS — C228 Malignant neoplasm of liver, primary, unspecified as to type: Secondary | ICD-10-CM | POA: Diagnosis not present

## 2020-08-12 DIAGNOSIS — I493 Ventricular premature depolarization: Secondary | ICD-10-CM | POA: Diagnosis not present

## 2020-08-12 DIAGNOSIS — R000 Tachycardia, unspecified: Secondary | ICD-10-CM | POA: Diagnosis not present

## 2020-08-12 DIAGNOSIS — R0789 Other chest pain: Secondary | ICD-10-CM | POA: Diagnosis not present

## 2020-08-12 DIAGNOSIS — C229 Malignant neoplasm of liver, not specified as primary or secondary: Secondary | ICD-10-CM | POA: Diagnosis not present

## 2020-08-12 DIAGNOSIS — R160 Hepatomegaly, not elsewhere classified: Secondary | ICD-10-CM | POA: Diagnosis not present

## 2020-08-13 ENCOUNTER — Telehealth: Payer: Self-pay

## 2020-08-13 DIAGNOSIS — G893 Neoplasm related pain (acute) (chronic): Secondary | ICD-10-CM | POA: Diagnosis not present

## 2020-08-13 DIAGNOSIS — R160 Hepatomegaly, not elsewhere classified: Secondary | ICD-10-CM | POA: Diagnosis not present

## 2020-08-13 NOTE — Telephone Encounter (Signed)
Pt's husband Gershon Mussel called and asked to speak to you. He states that the reason for his call is too long to repeat twice. Please advise

## 2020-08-14 DIAGNOSIS — R404 Transient alteration of awareness: Secondary | ICD-10-CM | POA: Diagnosis not present

## 2020-08-14 DIAGNOSIS — R58 Hemorrhage, not elsewhere classified: Secondary | ICD-10-CM | POA: Diagnosis not present

## 2020-08-15 NOTE — Telephone Encounter (Signed)
Pt husband called and wanted a call back from Dr. Nicki Reaper personally

## 2020-08-15 NOTE — Telephone Encounter (Signed)
Called and left message for husband letting him know that Dr Nicki Reaper was planning on reaching out but she is still seeing patients so it would be some time after that.

## 2020-08-16 NOTE — Telephone Encounter (Signed)
Called and spoke to Mr Latendresse.

## 2020-08-20 ENCOUNTER — Ambulatory Visit: Payer: PPO | Admitting: Internal Medicine

## 2020-08-21 ENCOUNTER — Ambulatory Visit: Payer: PPO | Admitting: Internal Medicine

## 2020-09-11 DIAGNOSIS — 419620001 Death: Secondary | SNOMED CT | POA: Diagnosis not present

## 2020-09-11 NOTE — Telephone Encounter (Signed)
Called husband to discuss. Advised that patient has passed away. Gave husband our condolences and advised to let us know if they need anything.

## 2020-09-11 DEATH — deceased

## 2020-09-18 ENCOUNTER — Telehealth: Payer: Self-pay | Admitting: Internal Medicine

## 2020-09-18 NOTE — Telephone Encounter (Signed)
-----   Message from Salvatore Marvel sent at 09/18/2020 12:53 PM EST ----- Regarding: [place] Aug 30, 2020

## 2020-10-15 ENCOUNTER — Ambulatory Visit: Payer: PPO | Admitting: Podiatry

## 2021-07-10 ENCOUNTER — Ambulatory Visit: Payer: PPO

## 2021-10-06 IMAGING — MR MR KNEE*L* W/O CM
6 series · 40 of 40 positions shown · non-contrast
Comparison: None.

CLINICAL DATA: Left knee pain and swelling.

EXAM:
MRI OF THE LEFT KNEE WITHOUT CONTRAST
TECHNIQUE: Multiplanar, multisequence MR imaging of the knee was performed. No
intravenous contrast was administered.

[Series 8: T2 fat-sat · axial · left · 4.0mm · 0.50mm/px · z∈[-75,+49]mm · 5 of 26 slices shown (1 of 3)]
[im 1/26]
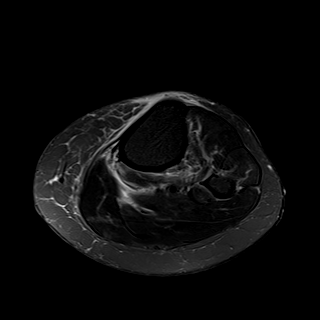
[im 7/26]
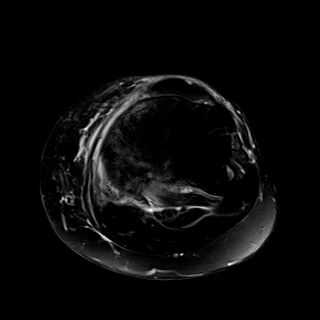
[im 13/26]
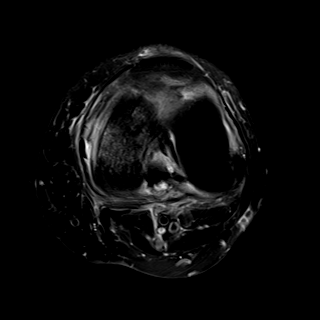
[im 19/26]
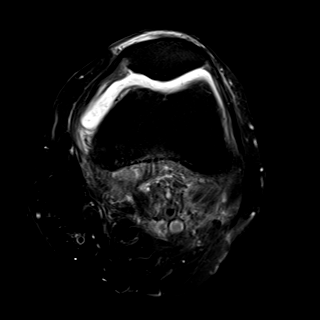
[im 26/26]
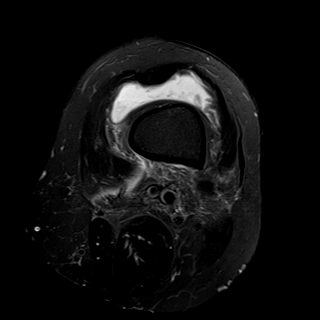

[Series 9: T2 fat-sat · coronal · left · 4.0mm · 0.59mm/px · 7 of 28 slices shown (2 of 3)]
[im 1/28]
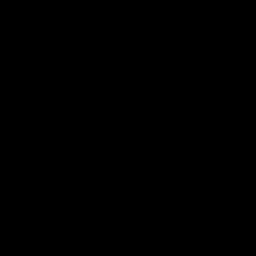
[im 5/28]
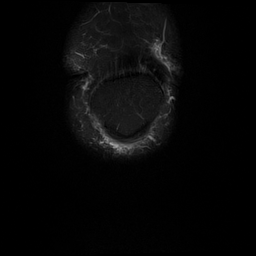
[im 10/28]
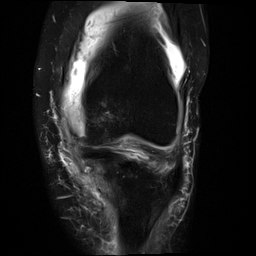
[im 14/28]
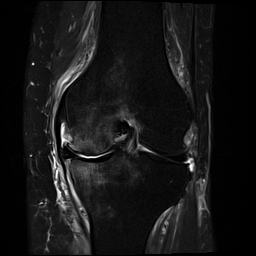
[im 19/28]
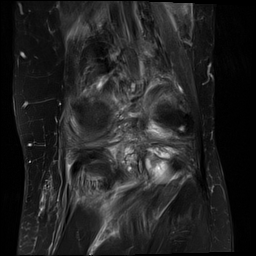
[im 23/28]
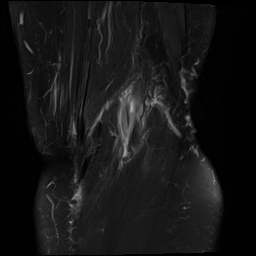
[im 28/28]
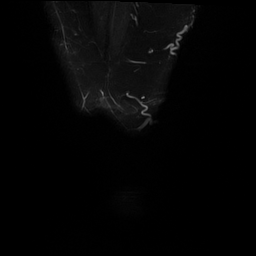

[Series 10: T1 · coronal · left · 4.0mm · 0.59mm/px · 7 of 28 slices shown]
[im 1/28]
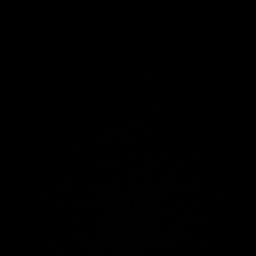
[im 5/28]
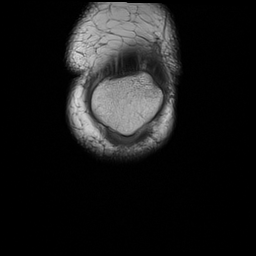
[im 10/28]
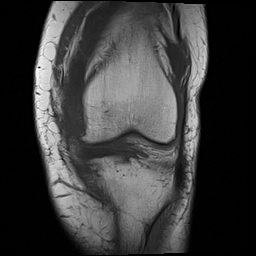
[im 14/28]
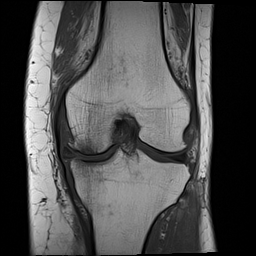
[im 19/28]
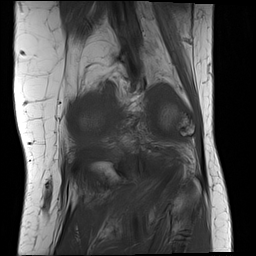
[im 23/28]
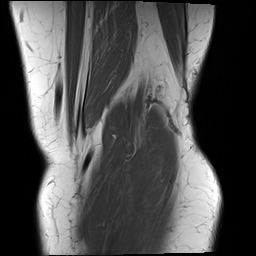
[im 28/28]
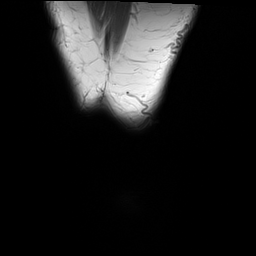

[Series 11: PD fat-sat · coronal · left · 4.0mm · 0.59mm/px · 7 of 28 slices shown (1 of 2)]
[im 1/28]
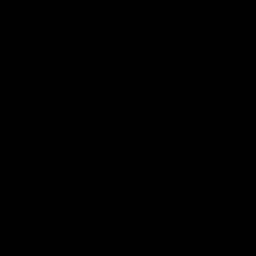
[im 5/28]
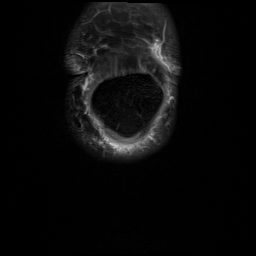
[im 10/28]
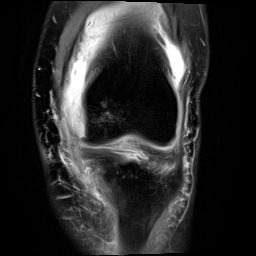
[im 14/28]
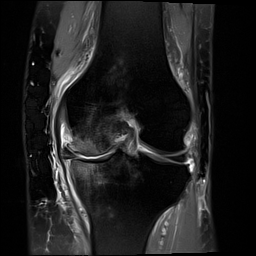
[im 19/28]
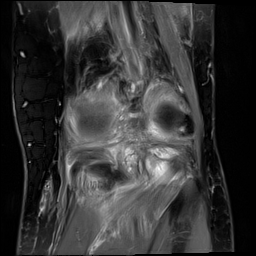
[im 23/28]
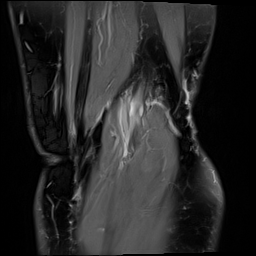
[im 28/28]
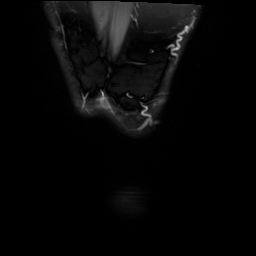

[Series 12: PD fat-sat · sagittal · left · 3.0mm · 0.59mm/px · 7 of 29 slices shown (2 of 2)]
[im 1/29]
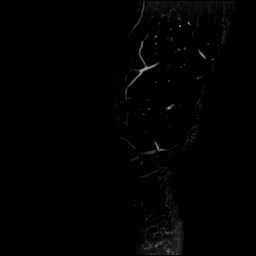
[im 5/29]
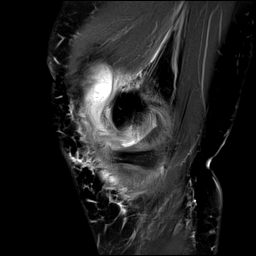
[im 10/29]
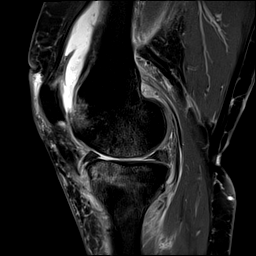
[im 15/29]
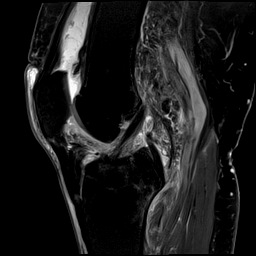
[im 19/29]
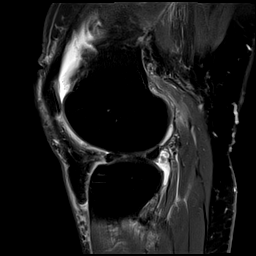
[im 24/29]
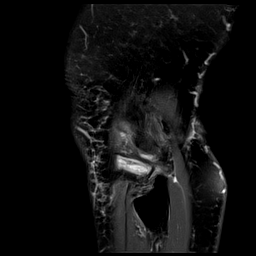
[im 29/29]
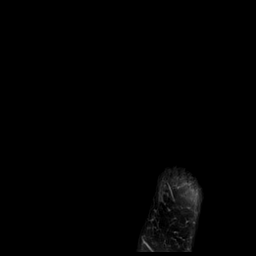

[Series 13: T2 fat-sat · sagittal · left · 3.0mm · 0.59mm/px · 7 of 31 slices shown (3 of 3)]
[im 1/31]
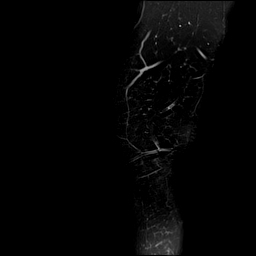
[im 6/31]
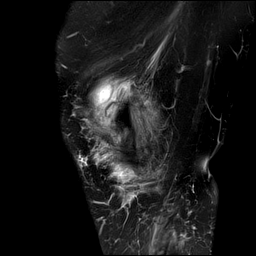
[im 11/31]
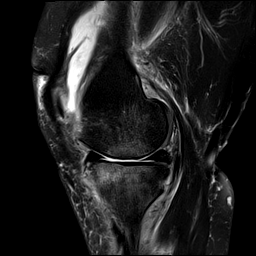
[im 16/31]
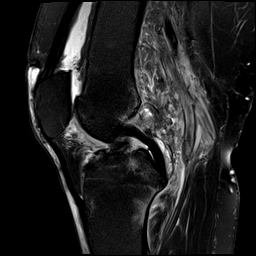
[im 21/31]
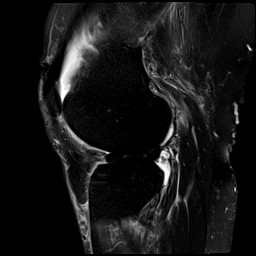
[im 26/31]
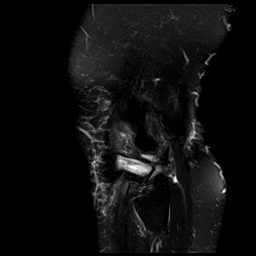
[im 31/31]
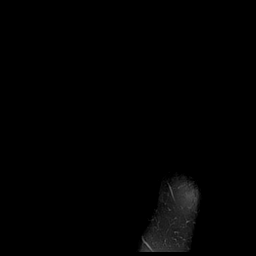

[40 of 40 positions shown; findings below may reference images not displayed]

FINDINGS: MENISCI

Medial meniscus: There is what appears to be an incomplete radial
tear of the root of the posterior horn. There is degeneration of the
posterior horn and midbody with peripheral extrusion of the meniscus
due to medial joint space narrowing.

Lateral meniscus:  Normal.

LIGAMENTS

Cruciates:  Intact.

Collaterals: Intact. Edema deep and superficial to the MCL is felt
to be due to the medial compartment disease rather than injury to
the MCL.

CARTILAGE

Patellofemoral: Full-thickness cartilage loss of the anterior
superior aspect of the medial aspect of the trochlear groove of the
distal femur with subcortical edema. Diffuse thinning of the
articular cartilage on the medial facet patella.

Medial: Diffuse full-thickness cartilage loss of the central portion
of the femoral condyle and diffuse marked thinning of the articular
cartilage of the medial tibial plateau with subcortical edema in the
condyle and tibial plateau.

Lateral:  Normal.

Joint: Moderate joint effusion. Synovial hypertrophy and debris in
the joint. Normal Hoffa's fat pad. No plical thickening.

Popliteal Fossa: No Baker's cyst. Intact popliteus tendon. There is
edema in the popliteus muscle and adjacent to the pes anserine
tendons at the posteromedial corner of the knee, all likely due to
the joint effusion.

Extensor Mechanism:  Normal.

Bones: Small tricompartmental marginal osteophytes, most prominent
in the medial compartment. Subcortical edema in the medial
compartment.

Other: There is a small amount of fluid in the prepatellar bursa
adjacent to the upper pole of the patella.
IMPRESSION: 1. Moderately severe osteoarthritis of the medial compartment with
an incomplete radial tear of the root of the posterior horn of the
medial meniscus.
2. Moderate joint effusion with synovial hypertrophy and debris in
the joint.
3. Edema in the popliteus muscle and adjacent to the pes anserine
tendons at the posteromedial corner of the knee, likely due to the
medial compartment disease rather than injury to the MCL.
4. Chondromalacia of the patellofemoral compartment as described.
5. Mild prepatellar bursitis.
